# Patient Record
Sex: Male | Born: 1975 | Race: White | Hispanic: No | Marital: Single | Smoking: Former smoker
Health system: Southern US, Community
[De-identification: ages and names within clinical notes are randomized; demographics above are authoritative.]

## PROBLEM LIST (undated history)

## (undated) DIAGNOSIS — Z87442 Personal history of urinary calculi: Secondary | ICD-10-CM

## (undated) DIAGNOSIS — Z8719 Personal history of other diseases of the digestive system: Secondary | ICD-10-CM

## (undated) DIAGNOSIS — L709 Acne, unspecified: Secondary | ICD-10-CM

## (undated) DIAGNOSIS — E782 Mixed hyperlipidemia: Secondary | ICD-10-CM

## (undated) DIAGNOSIS — E119 Type 2 diabetes mellitus without complications: Secondary | ICD-10-CM

## (undated) DIAGNOSIS — F419 Anxiety disorder, unspecified: Secondary | ICD-10-CM

## (undated) DIAGNOSIS — G4733 Obstructive sleep apnea (adult) (pediatric): Secondary | ICD-10-CM

## (undated) DIAGNOSIS — K589 Irritable bowel syndrome without diarrhea: Secondary | ICD-10-CM

## (undated) DIAGNOSIS — F411 Generalized anxiety disorder: Secondary | ICD-10-CM

## (undated) DIAGNOSIS — G4730 Sleep apnea, unspecified: Secondary | ICD-10-CM

## (undated) DIAGNOSIS — K629 Disease of anus and rectum, unspecified: Secondary | ICD-10-CM

## (undated) DIAGNOSIS — Z9989 Dependence on other enabling machines and devices: Secondary | ICD-10-CM

## (undated) DIAGNOSIS — R7989 Other specified abnormal findings of blood chemistry: Secondary | ICD-10-CM

## (undated) DIAGNOSIS — N62 Hypertrophy of breast: Secondary | ICD-10-CM

## (undated) DIAGNOSIS — I10 Essential (primary) hypertension: Secondary | ICD-10-CM

## (undated) DIAGNOSIS — N529 Male erectile dysfunction, unspecified: Secondary | ICD-10-CM

## (undated) DIAGNOSIS — K602 Anal fissure, unspecified: Secondary | ICD-10-CM

## (undated) DIAGNOSIS — F1020 Alcohol dependence, uncomplicated: Secondary | ICD-10-CM

## (undated) DIAGNOSIS — K219 Gastro-esophageal reflux disease without esophagitis: Secondary | ICD-10-CM

## (undated) DIAGNOSIS — Z973 Presence of spectacles and contact lenses: Secondary | ICD-10-CM

## (undated) DIAGNOSIS — F32A Depression, unspecified: Secondary | ICD-10-CM

## (undated) DIAGNOSIS — E291 Testicular hypofunction: Secondary | ICD-10-CM

## (undated) DIAGNOSIS — R7303 Prediabetes: Secondary | ICD-10-CM

## (undated) DIAGNOSIS — F329 Major depressive disorder, single episode, unspecified: Secondary | ICD-10-CM

## (undated) HISTORY — DX: Anxiety disorder, unspecified: F41.9

## (undated) HISTORY — DX: Alcohol dependence, uncomplicated: F10.20

## (undated) HISTORY — DX: Gastro-esophageal reflux disease without esophagitis: K21.9

## (undated) HISTORY — DX: Irritable bowel syndrome, unspecified: K58.9

## (undated) HISTORY — PX: COLONOSCOPY: SHX174

---
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
## 1997-08-06 HISTORY — PX: URETEROLITHOTOMY: SHX71

## 2002-08-06 DIAGNOSIS — Z8719 Personal history of other diseases of the digestive system: Secondary | ICD-10-CM

## 2002-08-06 HISTORY — DX: Personal history of other diseases of the digestive system: Z87.19

## 2009-07-28 ENCOUNTER — Ambulatory Visit (HOSPITAL_BASED_OUTPATIENT_CLINIC_OR_DEPARTMENT_OTHER): Admission: RE | Admit: 2009-07-28 | Discharge: 2009-07-28 | Payer: Self-pay | Admitting: Family Medicine

## 2009-08-06 ENCOUNTER — Ambulatory Visit: Payer: Self-pay | Admitting: Internal Medicine

## 2009-10-03 ENCOUNTER — Observation Stay (HOSPITAL_COMMUNITY): Admission: EM | Admit: 2009-10-03 | Discharge: 2009-10-05 | Payer: Self-pay | Admitting: Internal Medicine

## 2009-10-03 ENCOUNTER — Encounter: Payer: Self-pay | Admitting: Emergency Medicine

## 2009-10-03 ENCOUNTER — Ambulatory Visit: Payer: Self-pay | Admitting: Internal Medicine

## 2009-10-03 ENCOUNTER — Ambulatory Visit: Payer: Self-pay | Admitting: Radiology

## 2009-10-05 ENCOUNTER — Encounter: Payer: Self-pay | Admitting: Internal Medicine

## 2009-10-05 HISTORY — PX: CARDIOVASCULAR STRESS TEST: SHX262

## 2010-04-11 ENCOUNTER — Emergency Department (HOSPITAL_BASED_OUTPATIENT_CLINIC_OR_DEPARTMENT_OTHER): Admission: EM | Admit: 2010-04-11 | Discharge: 2010-04-11 | Payer: Self-pay | Admitting: Emergency Medicine

## 2010-04-11 ENCOUNTER — Ambulatory Visit: Payer: Self-pay | Admitting: Diagnostic Radiology

## 2010-04-14 DIAGNOSIS — E781 Pure hyperglyceridemia: Secondary | ICD-10-CM | POA: Insufficient documentation

## 2010-04-14 DIAGNOSIS — R079 Chest pain, unspecified: Secondary | ICD-10-CM | POA: Insufficient documentation

## 2010-04-14 DIAGNOSIS — I10 Essential (primary) hypertension: Secondary | ICD-10-CM | POA: Insufficient documentation

## 2010-04-14 DIAGNOSIS — R002 Palpitations: Secondary | ICD-10-CM

## 2010-04-18 ENCOUNTER — Ambulatory Visit: Payer: Self-pay | Admitting: Cardiovascular Disease

## 2010-09-05 NOTE — Assessment & Plan Note (Signed)
Summary: np6/chest pains   Visit Type:  new pt visit Primary Emya Picado:  Sharlyne Cai [facility])  CC:  chest pain...sob....denies any edema...pt states he is not sure if this is a panic attack.  History of Present Illness: 35 yo WM with history of HTN, hyperlipidemia, OSA  who is here today to establish cardiology care. He describes atypical chest pain back in March 2011. He was admitted to [facility] in March 2011 and had a normal echo and stress myoview. He did well until the last week. 10 days ago, he developed a constant aching/pressure in his chest. He was seen in the ED one week ago at [facility]. His cardiac enzymes were negative. He had no EKG changes. He was not admitted. He is here today for ED follow up. He has been feeling better. Occasional pressure in the chest at rest at times of anxiety.   Current Medications (verified): 1)  Clonazepam 0.5 Mg Tabs (Clonazepam) .... Two Times A Day As Needed 2)  Amlodipine Besylate 10 Mg Tabs (Amlodipine Besylate) .... Take One Tablet By Mouth Daily 3)  Aspirin 81 Mg Tbec (Aspirin) .... Take One Tablet By Mouth Daily 4)  Tenoretic 50 50-25 Mg Tabs (Atenolol-Chlorthalidone) .Marland Kitchen.. 1 Tab Once Daily 5)  Bupropion Hcl 300 Mg Xr24h-Tab (Bupropion Hcl) .Marland Kitchen.. 1 Tab Once Daily 6)  Cranberry Fruit 475 Mg Caps (Cranberry) .... 2 Once Daily 7)  Colace 100 Mg Caps (Docusate Sodium) .... 2 Once Daily 8)  Lisinopril 20 Mg Tabs (Lisinopril) .... Take One Tablet By Mouth Daily 9)  Minocycline Hcl 100 Mg Caps (Minocycline Hcl) .... Once Daily 10)  Multivitamins   Tabs (Multiple Vitamin) .... Once Daily 11)  Oscal 500/200 D-3 500-200 Mg-Unit Tabs (Calcium-Vitamin D) .... Once Daily 12)  Potassium Chloride Cr 10 Meq Cr-Caps (Potassium Chloride) .... Take One Tablet By Mouth Daily 13)  Tylenol Pm Extra Strength 500-25 Mg Tabs (Diphenhydramine-Apap (Sleep)) .... 2 Tab At Bedtime As Needed 14)  Viagra 25 Mg Tabs (Sildenafil Citrate) .... 1/2 As  Needed 15)  Fish Oil 1200 Mg Caps (Omega-3 Fatty Acids) .Marland Kitchen.. 1 Cap Once Daily 16)  Lipitor 20 Mg Tabs (Atorvastatin Calcium) .Marland Kitchen.. 1 Tab At Bedtime  Allergies (verified): No Known Drug Allergies  Past History:  Past Medical History: OSA Palpitations Anxiety/Depression Hyperlipidemia Hypertension Bowel obstruction 2003 Erectile dysfunction  Past Surgical History: History of testicular torsion status post orchiectomy  History of nephrolithiasis s/p removal  Family History: Family History of Coronary Artery Disease:  Family History of Diabetes:   Mother-alive, palpitations Father-alive, estranged but does not think he has heart disease 1 brother-alive and well 1 sister -learning disabled, no cardiac disease  Social History: Full Time Single, no kids 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  Alcohol Use - yes -- socially Drug Use - no  Review of Systems       The patient complains of chest pain.  The patient denies fatigue, malaise, fever, weight gain/loss, vision loss, decreased hearing, hoarseness, palpitations, shortness of breath, prolonged cough, wheezing, sleep apnea, coughing up blood, abdominal pain, blood in stool, nausea, vomiting, diarrhea, heartburn, incontinence, blood in urine, muscle weakness, joint pain, leg swelling, rash, skin lesions, headache, fainting, dizziness, depression, anxiety, enlarged lymph nodes, easy bruising or bleeding, and environmental allergies.    Vital Signs:  Patient profile:   35 year old male Height:      72 inches Weight:      216.8 pounds  BMI:     29.51 Pulse rate:   73 / minute Pulse rhythm:   regular BP sitting:   110 / 70  (left arm) Cuff size:   large  Vitals Entered By: Danielle Rankin, CMA (April 18, 2010 4:22 PM)  Physical Exam  General:  General: Well developed, well nourished, NAD HEENT: OP clear, mucus membranes moist SKIN: warm, dry Neuro: No focal deficits Musculoskeletal:  Muscle strength 5/5 all ext Psychiatric: Mood and affect normal Neck: No JVD, no carotid bruits, no thyromegaly, no lymphadenopathy. Lungs:Clear bilaterally, no wheezes, rhonci, crackles CV: RRR no murmurs, gallops rubs Abdomen: soft, NT, ND, BS present Extremities: No edema, pulses 2+.    EKG  Procedure date:  04/18/2010  Findings:      NSR, rate 73 bpm. Normal EKG  Impression & Recommendations:  Problem # 1:  CHEST PAIN-UNSPECIFIED (ICD-786.50) Atypical. This does not seem to be cardiac. He has a recent normal stress myoview and normal LV function on echo with no structural heart disease. No further cardiac workup at this time. I have encouraged him to begin a diet and exercise plan. He should also continue to aggressively treat his cardiac risk factors including HTN and hyperlipidemia.  Complete tobacco cessation recommended.   His updated medication list for this problem includes:    Amlodipine Besylate 10 Mg Tabs (Amlodipine besylate) .Marland Kitchen... Take one tablet by mouth daily    Aspirin 81 Mg Tbec (Aspirin) .Marland Kitchen... Take one tablet by mouth daily    Tenoretic 50 50-25 Mg Tabs (Atenolol-chlorthalidone) .Marland Kitchen... 1 tab once daily    Lisinopril 20 Mg Tabs (Lisinopril) .Marland Kitchen... Take one tablet by mouth daily  Patient Instructions: 1)  Your physician recommends that you schedule a follow-up appointment as needed. 2)  Your physician recommends that you continue on your current medications as directed. Please refer to the Current Medication list given to you today.

## 2010-10-19 LAB — CBC
HCT: 43.1 % (ref 39.0–52.0)
MCHC: 33.9 g/dL (ref 30.0–36.0)
MCV: 88.2 fL (ref 78.0–100.0)
Platelets: 285 10*3/uL (ref 150–400)
RDW: 12.2 % (ref 11.5–15.5)
WBC: 8.3 10*3/uL (ref 4.0–10.5)

## 2010-10-19 LAB — POCT CARDIAC MARKERS: Troponin i, poc: <0.05 ng/mL (ref 0.00–0.09)

## 2010-10-19 LAB — DIFFERENTIAL
Basophils Absolute: 0 10*3/uL (ref 0.0–0.1)
Basophils Relative: 1 % (ref 0–1)
Eosinophils Relative: 3 % (ref 0–5)
Lymphocytes Relative: 21 % (ref 12–46)
Monocytes Absolute: 0.6 10*3/uL (ref 0.1–1.0)

## 2010-10-19 LAB — BASIC METABOLIC PANEL
CO2: 28 mEq/L (ref 19–32)
Chloride: 103 mEq/L (ref 96–112)
Creatinine, Ser: 1.2 mg/dL (ref 0.4–1.5)
GFR calc Af Amer: >60 mL/min (ref >60)
Glucose, Bld: 105 mg/dL — ABNORMAL HIGH (ref 70–99)

## 2010-10-26 LAB — CBC
HCT: 43.9 % (ref 39.0–52.0)
MCHC: 34.9 g/dL (ref 30.0–36.0)
MCV: 87.4 fL (ref 78.0–100.0)
Platelets: 245 10*3/uL (ref 150–400)
RDW: 12 % (ref 11.5–15.5)

## 2010-10-26 LAB — CARDIAC PANEL(CRET KIN+CKTOT+MB+TROPI)
CK, MB: 1.5 ng/mL (ref 0.3–4.0)
Troponin I: 0.02 ng/mL (ref 0.00–0.06)

## 2010-10-26 LAB — DIFFERENTIAL
Basophils Absolute: 0.1 10*3/uL (ref 0.0–0.1)
Basophils Relative: 1 % (ref 0–1)
Eosinophils Absolute: 0.2 10*3/uL (ref 0.0–0.7)
Eosinophils Relative: 3 % (ref 0–5)
Lymphs Abs: 1.9 10*3/uL (ref 0.7–4.0)
Neutrophils Relative %: 65 % (ref 43–77)

## 2010-10-26 LAB — POCT CARDIAC MARKERS
CKMB, poc: <1 ng/mL — ABNORMAL LOW (ref 1.0–8.0)
Myoglobin, poc: 60 ng/mL (ref 12–200)
Troponin i, poc: <0.05 ng/mL (ref 0.00–0.09)
Troponin i, poc: <0.05 ng/mL (ref 0.00–0.09)

## 2010-10-26 LAB — D-DIMER, QUANTITATIVE: D-Dimer, Quant: <0.22 ug/mL-FEU (ref 0.00–0.48)

## 2010-10-26 LAB — BASIC METABOLIC PANEL
BUN: 14 mg/dL (ref 6–23)
CO2: 30 mEq/L (ref 19–32)
Calcium: 9.5 mg/dL (ref 8.4–10.5)
Chloride: 100 mEq/L (ref 96–112)
Creatinine, Ser: 1 mg/dL (ref 0.4–1.5)
GFR calc Af Amer: >60 mL/min (ref >60)
GFR calc non Af Amer: >60 mL/min (ref >60)
Glucose, Bld: 107 mg/dL — ABNORMAL HIGH (ref 70–99)
Potassium: 3.6 mEq/L (ref 3.5–5.1)
Sodium: 141 mEq/L (ref 135–145)

## 2010-10-30 LAB — COMPREHENSIVE METABOLIC PANEL
ALT: 26 U/L (ref 0–53)
Alkaline Phosphatase: 76 U/L (ref 39–117)
BUN: 15 mg/dL (ref 6–23)
CO2: 30 mEq/L (ref 19–32)
GFR calc non Af Amer: >60 mL/min (ref >60)
Glucose, Bld: 101 mg/dL — ABNORMAL HIGH (ref 70–99)
Potassium: 4.3 mEq/L (ref 3.5–5.1)
Sodium: 137 mEq/L (ref 135–145)

## 2010-10-30 LAB — CBC
HCT: 41.3 % (ref 39.0–52.0)
Hemoglobin: 14.4 g/dL (ref 13.0–17.0)
MCHC: 34.8 g/dL (ref 30.0–36.0)
RBC: 4.66 MIL/uL (ref 4.22–5.81)

## 2010-10-30 LAB — CARDIAC PANEL(CRET KIN+CKTOT+MB+TROPI): CK, MB: 1.4 ng/mL (ref 0.3–4.0)

## 2010-10-30 LAB — TSH: TSH: 1.153 u[IU]/mL (ref 0.350–4.500)

## 2011-01-27 IMAGING — CR DG CHEST 1V PORT
1 series · 1 of 1 positions shown · non-contrast
Comparison: None.

CLINICAL DATA: Tachycardia.  Chest pressure.

PORTABLE CHEST - 1 VIEW

[view not recorded]
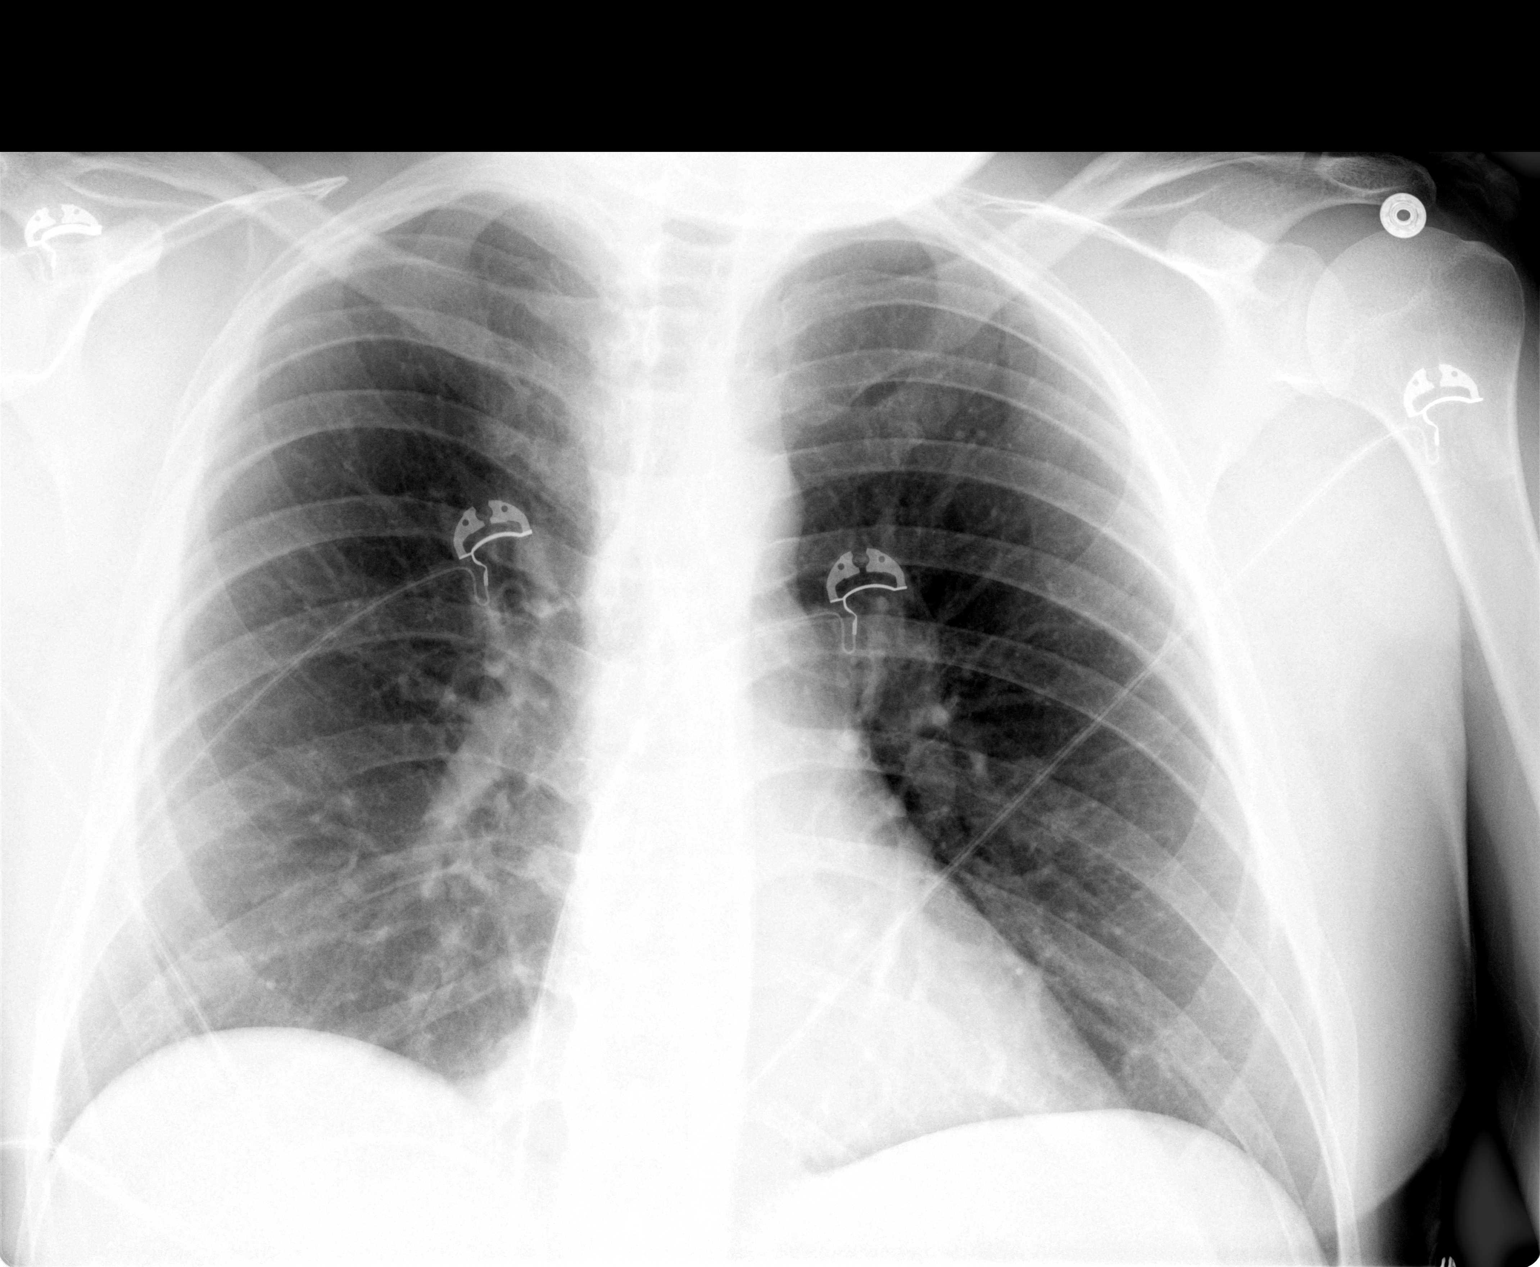

[1 of 1 positions shown; findings below may reference images not displayed]

FINDINGS: Normal cardiomediastinal silhouette size and contours for
this single view.  No pulmonary vascular congestion or active lung
process.  Intact bony thorax.
IMPRESSION: No active chest disease.

## 2011-06-29 ENCOUNTER — Emergency Department (HOSPITAL_BASED_OUTPATIENT_CLINIC_OR_DEPARTMENT_OTHER)
Admission: EM | Admit: 2011-06-29 | Discharge: 2011-06-29 | Disposition: A | Discharge status: Home / Self Care | Payer: Managed Care, Other (non HMO) | Attending: Emergency Medicine | Admitting: Emergency Medicine

## 2011-06-29 ENCOUNTER — Encounter: Payer: Self-pay | Admitting: Emergency Medicine

## 2011-06-29 DIAGNOSIS — G4730 Sleep apnea, unspecified: Secondary | ICD-10-CM | POA: Insufficient documentation

## 2011-06-29 DIAGNOSIS — I10 Essential (primary) hypertension: Secondary | ICD-10-CM | POA: Insufficient documentation

## 2011-06-29 DIAGNOSIS — E785 Hyperlipidemia, unspecified: Secondary | ICD-10-CM | POA: Insufficient documentation

## 2011-06-29 DIAGNOSIS — L509 Urticaria, unspecified: Secondary | ICD-10-CM | POA: Insufficient documentation

## 2011-06-29 HISTORY — DX: Essential (primary) hypertension: I10

## 2011-06-29 HISTORY — DX: Sleep apnea, unspecified: G47.30

## 2011-06-29 MED ORDER — PREDNISONE 50 MG PO TABS: ORAL_TABLET | ORAL | Status: AC

## 2011-06-29 MED ORDER — FAMOTIDINE 20 MG PO TABS
40.0000 mg | ORAL_TABLET | Freq: Once | ORAL | Status: AC
  Administered 2011-06-29: 40 mg via ORAL
  Filled 2011-06-29 (×2): qty 1

## 2011-06-29 MED ORDER — DIPHENHYDRAMINE HCL 25 MG PO CAPS
25.0000 mg | ORAL_CAPSULE | Freq: Once | ORAL | Status: AC
  Administered 2011-06-29: 25 mg via ORAL
  Filled 2011-06-29 (×2): qty 1

## 2011-06-29 MED ORDER — DEXAMETHASONE SODIUM PHOSPHATE 10 MG/ML IJ SOLN
10.0000 mg | Freq: Once | INTRAMUSCULAR | Status: AC
Start: 2011-06-29 — End: 2011-06-29
  Administered 2011-06-29: 10 mg via INTRAMUSCULAR
  Filled 2011-06-29: qty 1

## 2011-06-29 NOTE — ED Provider Notes (Signed)
History     CSN: [id number] Arrival date & time: 06/29/2011  3:49 AM   First MD Initiated Contact with Patient 06/29/11 0340      Chief Complaint  Patient presents with  . Rash    Patient is a 35 y.o. male presenting with rash. The history is provided by the patient.  Rash  This is a new problem. The current episode started 1 to 2 hours ago. The problem has been gradually worsening. The problem is associated with nothing. There has been no fever. Affected Location: torso/extremities. The patient is experiencing no pain. Associated symptoms include itching. The treatment provided mild relief.  pt reports onset of hives earlier tonight Only new exposure is bactrim, though he has been on this previously without issue No sob No dizziness/diarrhea No difficulty swallowing, no facial swelling  Past Medical History  Diagnosis Date  . Hypertension   . Hyperlipemia   . Sleep apnea     Past Surgical History  Procedure Date  . Testicle removal   . Cystoscopy     No family history on file.  History  Substance Use Topics  . Smoking status: Never Smoker   . Smokeless tobacco: Not on file  . Alcohol Use: Yes      Review of Systems  Constitutional: Negative for fever.  Skin: Positive for itching and rash.  All other systems reviewed and are negative.    Allergies  Review of patient's allergies indicates no known allergies.  Home Medications   Current Outpatient Rx  Name Route Sig Dispense Refill  . AMLODIPINE BESYLATE 10 MG PO TABS Oral Take 10 mg by mouth daily.      . ATENOLOL-CHLORTHALIDONE 50-25 MG PO TABS Oral Take 1 tablet by mouth daily.      . ATORVASTATIN CALCIUM 20 MG PO TABS Oral Take 20 mg by mouth daily.      . BUPROPION HCL ER (XL) 150 MG PO TB24 Oral Take 150 mg by mouth daily.      Marland Kitchen DIPHENHYDRAMINE HCL (SLEEP) 25 MG PO TABS Oral Take 50 mg by mouth once.      Marland Kitchen LISINOPRIL 20 MG PO TABS Oral Take 20 mg by mouth daily.      Marland Kitchen POTASSIUM CHLORIDE 10 MEQ  PO TBCR Oral Take 10 mEq by mouth daily.      . SULFAMETHOXAZOLE-TRIMETHOPRIM 800-160 MG PO TABS Oral Take 1 tablet by mouth 2 (two) times daily.        BP 143/83  Pulse 93  Temp(Src) 98.1 F (36.7 C) (Oral)  Resp 18  SpO2 100%  Physical Exam  CONSTITUTIONAL: Well developed/well nourished HEAD AND FACE: Normocephalic/atraumatic EYES: EOMI/PERRL, no conjunctival lesions ENMT: Mucous membranes moist, no angioedema, no oral lesions, pharynx normal Voice normal.   Neck - supple CV: S1/S2 noted, no murmurs/rubs/gallops noted LUNGS: Lungs are clear to auscultation bilaterally, no apparent distress ABDOMEN: soft, nontender, no rebound or guarding GU:no cva tenderness NEURO: Pt is awake/alert, moves all extremitiesx4 EXTREMITIES: pulses normal, full ROM SKIN: warm, urticaria noted to extremities/torso, spares head/neck/palms PSYCH: no abnormalities of mood noted   ED Course  Procedures    4:03 AM Pt well appearing, will try more benadryl/steroids and reassess  4:57 AM Pt is going to stop his bactrim for now, start prednisone, f/u with his dermatologist next week Discussed strict return precautions  MDM  Nursing notes reviewed and considered in documentation         Joya Gaskins, MD 06/29/11 [phone]

## 2011-06-29 NOTE — ED Notes (Addendum)
Pt states he has been taking septra for folliculitis x 1 week. Pt took benadryl 50 mg po 30 min PTA

## 2011-06-29 NOTE — ED Notes (Signed)
Pt reports hive like rash started approx 2300 last night.

## 2013-05-19 DIAGNOSIS — G4733 Obstructive sleep apnea (adult) (pediatric): Secondary | ICD-10-CM | POA: Insufficient documentation

## 2013-06-25 ENCOUNTER — Encounter: Payer: Self-pay | Admitting: Internal Medicine

## 2013-06-25 ENCOUNTER — Ambulatory Visit (INDEPENDENT_AMBULATORY_CARE_PROVIDER_SITE_OTHER): Payer: 59 | Admitting: Internal Medicine

## 2013-06-25 VITALS — BP 124/84 | HR 84 | Temp 98.6°F | Resp 12 | Ht <= 58 in | Wt 237.5 lb

## 2013-06-25 DIAGNOSIS — E280 Estrogen excess: Secondary | ICD-10-CM

## 2013-06-25 DIAGNOSIS — E291 Testicular hypofunction: Secondary | ICD-10-CM

## 2013-06-25 NOTE — Progress Notes (Signed)
Patient ID: Tyler Gross, male   DOB: [birth year], 37 y.o.   MRN: [id number]  HPI: Tyler Gross is a 37 y.o.-year-old man, referred by his PCP, Dr. Ashley Royalty, for evaluation and management of low testosterone and gynecomastia.  He started to have Left breast pain 1.5-2 mo ago, but he has had enlargement for longer time. No nipple discharge or retraction. Breast ultrasound and mammogram: fibrocystic densities (read as gynecomastia) - report not available to review.   He was dx with hypogonadism in 2008: - 2008: Total testosterone 321, had pbs with erections then >> referred to urology : low T, LH also low >> saw endocrinology : PRL high >> had MRI pituitary >> no pituitary tumor. - 2011: seen at [facility] endocrinology >> PRL still high >> new pituitary MRI >> no tumor. Testosterone still slightly low >> advised to just watch it - 2014: testosterone 200s, referred again to endocrinology >> he is here now.   Reviewed labs available per Care Everywhere:   He had the L testicle resected in 1991 after trauma during swimming >> subsequent torsion. He has had R testicle U/S in 2001 >> no masses or torsion.  He admits for decreased libido - recently, b/c of stress Has difficulty maintaining an erection - Viagra works (but gets a HA and stuffy nose) + trauma to testes (see above), no testicular irradiation or surgery No h/o of mumps orchitis/h/o autoimmune ds. No h/o cryptorchidism He grew and went through puberty like his peers No shrinking of testes. No very small testes (<5 ml) No incomplete/delayed sexual development     + breast discomfort/gynecomastia    No loss of body hair (axillary/pubic)/decreased need for shaving No height loss but had osteopenia in R hip. No abnormal sense of smell. No hot flushes No vision problems No worst HA of his life No FH of hypogonadism/infertility No personal h/o infertility, but he does not have children by choice No FH of hemochromatosis   No  excessive weight gain or loss. 225 >> 237 in 1 year No chronic diseases No chronic pain. Not on opiates, does not take steroids.  Can have more than 2 drinks a day of alcohol at a time - some weeks 2-4 drinks a day No anabolic steroids use No herbal medicines On antidepressants: Bupropion and Celexa.  Other labs/investigations: - He had severe acne outbreaks >> 2x 2h urine for cortisol in 2008 and 2012 was normal - reportedly. - OGTT 2 years ago >> normal. Last HbA1c was from 2012: 5.5%. - prolactin: 08/27/2011: 11.6 02/08/2012: 11 06/02/2013: 14.7 - TSH: 09/27/2009: 1.850 10/02/2010: 1.300 07/02/2011: 2.799 06/27/2012: 0.670 - CMP: 03/11/2013: Normal except glucose 104 - lipids: 008/01/2013: 229/343/46/114 - vitamin D: 07/02/2011: 47 - Vitamin B12: 09/27/2009: 616   ROS: per HPI + Constitutional: + weight gain, + fatigue, no subjective hyperthermia/hypothermia Eyes: no blurry vision, no xerophthalmia ENT: no sore throat, no nodules palpated in throat, no dysphagia/odynophagia, no hoarseness Cardiovascular: no CP/SOB/+palpitations/no leg swelling Respiratory: no cough/SOB Gastrointestinal: no N/V/+D/+C Musculoskeletal: no muscle/joint aches Skin: no rashes now (had acne in the past - on back), + itching Neurological: no tremors/numbness/tingling/dizziness Psychiatric: + depression/+ anxiety  Past Medical History  Diagnosis Date  . Hypertension   . Hyperlipemia   . Sleep apnea    Past Surgical History  Procedure Laterality Date  . Testicle removal left 1990  . Cystoscopy - kidney stone removed  1999   History   Social History  . 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 Smoking status: Never Smoker   . Smokeless tobacco: Not on file  . Alcohol Use: 1.5 oz/week    3 drink(s) per week  . Drug Use: No  . Sexual Activity: Yes   Social History  Narrative   Lives with partner   Regular exercise: no   Caffeine use: 1 large cup of coffee daily; 2 to 3 sodas in the evening   Current Outpatient Prescriptions on File Prior to Visit  Medication Sig Dispense Refill  . amLODipine (NORVASC) 10 MG tablet Take 10 mg by mouth daily.        Marland Kitchen atenolol-chlorthalidone (TENORETIC) 50-25 MG per tablet Take 1 tablet by mouth daily.        Marland Kitchen atorvastatin (LIPITOR) 20 MG tablet Take 20 mg by mouth daily.        Marland Kitchen buPROPion (WELLBUTRIN XL) 150 MG 24 hr tablet Take 150 mg by mouth daily.        Marland Kitchen lisinopril (PRINIVIL,ZESTRIL) 20 MG tablet Take 20 mg by mouth daily.        . potassium chloride (KLOR-CON) 10 MEQ CR tablet Take 10 mEq by mouth daily.        Marland Kitchen sulfamethoxazole-trimethoprim (BACTRIM DS,SEPTRA DS) 800-160 MG per tablet Take 1 tablet by mouth 2 (two) times daily.         No current facility-administered medications on file prior to visit.   Allergies  Allergen Reactions  . Sulfamethoxazole-Tmp Ds Hives    Bactrim    Family History  Problem Relation Age of Onset  . Cancer Mother     breast cancer: stage I  . Hypertension Mother    PE: BP 124/84  Pulse 84  Temp(Src) 98.6 F (37 C) (Oral)  Resp 12  Ht 6" (0.152 m)  Wt 237 lb 8 oz (107.729 kg)  BMI 4662.79 kg/m2  SpO2 97% Wt Readings from Last 3 Encounters:  06/25/13 237 lb 8 oz (107.729 kg)  04/18/10 216 lb 12.8 oz (98.34 kg)   Constitutional: overweight, in NAD Eyes: PERRLA, EOMI, no exophthalmos ENT: moist mucous membranes, no thyromegaly, no cervical lymphadenopathy Cardiovascular: RRR, No MRG Respiratory: CTA B Gastrointestinal: abdomen soft, NT, ND, BS+ Musculoskeletal: no deformities, strength intact in all 4 Skin: moist, warm, no rashes Neurological: no tremor with outstretched hands, DTR 3/4 in all  4 Genital exam: normal male escutcheon, no inguinal LAD, normal phallus, R testis ~25 mL, L testicular prosthesis, no testicular masses, no penile discharge.  +  bilateral gynecomastia 3 cm. Dense nodules palpated near the gland tissue in left breast close to nipple and in R breast at 10 o'clock outside gland. No axillary LAD.  ASSESSMENT: 1. Low testosterone - normal free T  2. ED  PLAN:  1. Low total testosterone I had a long discussion with the pt regarding his testosterone results, which we reviewed together. I explained that the total testosterone consists of bioavailable T (free T + weakly bound) and bound testosterone. The total testosterone can be low: - later in the afternoon  - when the testes do not secrete enough testosterone (in this case the free testosterone is low, too)   - when the SHBG is low (in this case the free testosterone is normal) In his case, the lower SHBG is most likely the reason for the pt's repeatedly low total T. The  treatment for this is not exogenous testosterone, but improving his sleep, diet and exercise. Therefore, in his case, with a free testosterone was in the normal range, testosterone treatment is not indicated, and might even be harmful as per the latest studies that showed increased cardiac risk with testosterone supplementation. - I advised him that testosterone is only recommended for pts with clearly low testosterone. There is also no evidence that increasing the testosterone within the normal range will help with either erections or his energy. Moreover, the normal range is not spanning different ages, but it is determined in young men, at 8-9 am, fasting. Therefore, a free T level of 40 is not "the testosterone level of an 80-y/o man", but can be perfectly normal in a young man, too. - we also discussed about controlling his diet (advised him to cut down fat and meat and increase the use of fruit and vegetables), improve his cholesterol, improve his sleep - he has OSA, will have the Oxygen flow adjusted soon, exercise regularly. He agrees to start this.  - one other problem could be his increased Cymbalta  recently. All SSRIs are possible culprits for decreased testosterone.  - also, advised to limit alcohol to 2 drinks daily or less - we also discussed it is reassuring that he had 2 normal pituitary MRIs - We decided to recheck testosterone fasting, at 8 am. - pt was relieved that he does not need to start testosterone and agrees to have a repeat testosterone level checked  2. Gynecomastia L>R - fibrocystic changes bilaterally per mammogram and U/S - I do not have the full report but gynecomastia is palpable ~3 cm bilaterally - we discussed possible etiologies, frequently 2/2 increased ratio of Estrogen/Testosterone  - we will check several labs to make sure he does not have hyperthyroidism or a testicular tumor.  - we can try Clomiphene 50 mg daily for 3 mo to help with this (it will also help the testosterone level, although I underlined the fact that this would not be the first reason for using it). We also discussed Tamoxifen use, but I would prefer Clomiphene for him 2/2 low testosterone and osteopenia since Clomiphene is a selective hypothalamic and pituitary estrogen R blocker. - he agrees to plan  3. Erectile dysfunction - this is most likely not caused by the decreased testosterone, since his free testosterone is in the normal range - continue PDE5 inhibitor   Component     Latest Ref Rng 07/01/2013  WBC     4.5 - 10.5 K/uL 8.8  RBC     4.22 - 5.81 Mil/uL 5.01  Platelets     150.0 - 400.0 K/uL 259.0  Hemoglobin     13.0 - 17.0 g/dL 21.3  HCT     08.6 - 57.8 % 44.8  MCV     78.0 - 100.0 fl 89.3  MCHC     30.0 - 36.0 g/dL 46.9  RDW     62.9 - 52.8 % 13.2  Testosterone     300 - 890 ng/dL 413  Sex Hormone Binding     13 - 71 nmol/L 28  Testosterone Free     47.0 - 244.0 pg/mL 73.6  Testosterone-% Free     1.6 - 2.9 % 2.2  Estradiol, Free      0.51 (H)  Estradiol      24  Results received      07/09/13  Iron     42 - 165 ug/dL 88  Transferrin     212.0 - 360.0  mg/dL 161.0  Saturation Ratios     20.0 - 50.0 % 20.2  LH     1.50 - 9.30 mIU/mL 3.38  FSH     1.4 - 18.1 mIU/ML 5.3  Somatomedin (IGF-I)     73 - 330 ng/mL 113  hCG, Beta Chain, Quant, S      0.17  TSH     0.35 - 5.50 uIU/mL 1.39  Free T4     0.60 - 1.60 ng/dL 9.60  T3, Free     2.3 - 4.2 pg/mL 2.9  Hemoglobin A1C     4.6 - 6.5 % 5.8  PSA     0.10 - 4.00 ng/mL 1.33   All normal, except slightly high Estradiol. D/w pt over the phone >> will get testicular U/S. No indication for testosterone replacement. Patient pleased to hear that. He tells me that his breast pain is almost gone, in this case, he would like to hold off clomiphene or tamoxifen, and I agree. I advised him to let me know if the pain comes back.  CLINICAL DATA: History of left orchiectomy following traumatic injury in 1991. Low testosterone and estradiol access.  EXAM: ULTRASOUND OF SCROTUM  TECHNIQUE: Complete ultrasound examination of the testicles, epididymis, and other scrotal structures was performed.  COMPARISON: None.  FINDINGS: Right testicle  Measurements: 5.8 x 2.9 x 4.0 cm. No mass or microlithiasis visualized.  Left testicle  Surgically absent. There is a hypoechoic prosthesis in the left hemiscrotum.  Right epididymis: Normal in size and appearance.  Left epididymis: Surgically absent.  Hydrocele: None visualized.  Varicocele: None visualized.  IMPRESSION: Negative. No evidence for testicular mass or other significant abnormality in the solitary right testicle. The left testicular prosthesis is unremarkable in appearance.   Electronically Signed By: Malachy Moan M.D. On: 07/22/2013 08:34

## 2013-06-25 NOTE — Patient Instructions (Signed)
Please come back for a follow-up appointment in 3 months. Please come to the lab fasting, at 8 am.

## 2013-07-01 ENCOUNTER — Other Ambulatory Visit (INDEPENDENT_AMBULATORY_CARE_PROVIDER_SITE_OTHER): Payer: 59

## 2013-07-01 DIAGNOSIS — E291 Testicular hypofunction: Secondary | ICD-10-CM

## 2013-07-01 LAB — IBC PANEL: Iron: 88 ug/dL (ref 42–165)

## 2013-07-01 LAB — CBC
HCT: 44.8 % (ref 39.0–52.0)
Hemoglobin: 15.3 g/dL (ref 13.0–17.0)
MCV: 89.3 fl (ref 78.0–100.0)
Platelets: 259 10*3/uL (ref 150.0–400.0)
RBC: 5.01 Mil/uL (ref 4.22–5.81)
WBC: 8.8 10*3/uL (ref 4.5–10.5)

## 2013-07-01 LAB — TSH: TSH: 1.39 u[IU]/mL (ref 0.35–5.50)

## 2013-07-01 LAB — HCG, QUANTITATIVE, PREGNANCY: hCG, Beta Chain, Quant, S: 0.17 m[IU]/mL

## 2013-07-01 LAB — LUTEINIZING HORMONE: LH: 3.38 m[IU]/mL (ref 1.50–9.30)

## 2013-07-01 LAB — T4, FREE: Free T4: 0.7 ng/dL (ref 0.60–1.60)

## 2013-07-01 LAB — FOLLICLE STIMULATING HORMONE: FSH: 5.3 m[IU]/mL (ref 1.4–18.1)

## 2013-07-01 LAB — HEMOGLOBIN A1C: Hgb A1c MFr Bld: 5.8 % (ref 4.6–6.5)

## 2013-07-03 LAB — TESTOSTERONE, FREE, TOTAL, SHBG
Testosterone, Free: 73.6 pg/mL (ref 47.0–244.0)
Testosterone-% Free: 2.2 % (ref 1.6–2.9)
Testosterone: 337 ng/dL (ref 300–890)

## 2013-07-03 LAB — INSULIN-LIKE GROWTH FACTOR: Somatomedin (IGF-I): 113 ng/mL (ref 73–330)

## 2013-07-09 ENCOUNTER — Telehealth: Payer: Self-pay | Admitting: Internal Medicine

## 2013-07-09 LAB — ESTRADIOL, FREE
Estradiol, Free: 0.51 pg/mL — ABNORMAL HIGH
Estradiol: 24 pg/mL

## 2013-07-09 NOTE — Telephone Encounter (Signed)
Pt would like all of his lab results please Call back: [phone]  Thank You :)

## 2013-07-09 NOTE — Telephone Encounter (Signed)
Pt called requesting lab results. Please advise.  

## 2013-07-09 NOTE — Telephone Encounter (Signed)
Estradiol, IGF1 and testosterone still pending >> Carollee Herter, can you please check with the lab about this? I sent him the rest of the labs through MyChart right before [facility]iving.

## 2013-07-14 ENCOUNTER — Encounter: Payer: Self-pay | Admitting: Neurology

## 2013-07-14 ENCOUNTER — Ambulatory Visit (INDEPENDENT_AMBULATORY_CARE_PROVIDER_SITE_OTHER): Payer: 59 | Admitting: Neurology

## 2013-07-14 VITALS — BP 120/83 | HR 87 | Temp 98.5°F | Ht 72.0 in | Wt 243.0 lb

## 2013-07-14 DIAGNOSIS — G4733 Obstructive sleep apnea (adult) (pediatric): Secondary | ICD-10-CM

## 2013-07-14 DIAGNOSIS — I10 Essential (primary) hypertension: Secondary | ICD-10-CM

## 2013-07-14 DIAGNOSIS — F411 Generalized anxiety disorder: Secondary | ICD-10-CM

## 2013-07-14 NOTE — Progress Notes (Signed)
Subjective:    Patient ID: Tyler Gross is a 37 y.o. male.  HPI   Huston Foley, MD, PhD [facility] [address], [address] [address] [place], [place] [address]  Dear Dr. Ashley Royalty,   I saw your patient, Tyler Gross, upon your kind request in my neurologic clinic today for initial consultation of his sleep disorder, in particular his obstructive sleep apnea. The patient is unaccompanied today. As you know, [name] is a 37 year old right-handed gentleman with an underlying medical history of hypogonadism, who was previously diagnosed with obstructive sleep apnea in 2008. He presents for re-evaluation.  I reviewed his sleep study report from July 2008 which was a split-night sleep study and showed an elevated AHI of 87 per hour. It appeared that CPAP of 8 cm was adequate with AHI less than 5 per hour and oxygen saturation above 90 with snoring eliminated. He reports weight gain of at least 20 lb since then. He was placed on CPAP of 8 cm, then on 10 cm in 2010. Gradually, he started noticing, that he may not be getting enough air at night and was placed on Auto-PAP.  I reviewed a CPAP titration study which he had a [place] from 07/28/2009. CPAP was titrated to a pressure of 10 cm of water and his AHI was reduced to 0 per hour. He was given a full face mask size medium. His sleep efficiency was 88.4%. He had normal percentage of REM sleep with a normal REM latency. His arousal index was 16.3/h.  He has been on AutoPap for the past 14 days, from 4-20 cm and I reviewed his compliance data for the past 14 days, 06/30/2013 through 07/13/2013 during which time he was fully compliant with CPAP. His average usage was 7 hours and 25 minutes. His sleep was reasonable. His residual AHI was 1.7 per hour. His 95th percentile pressure was 11.8 cm of water. He still uses a FFM CPAP has helped his sleep, his EDS and he used to wake up in a panic or startle before he was  diagnosed. He used to snore very loudly.    His typical bedtime is reported to be around 11:30 PM and usual wake time is around 7 AM. Sleep onset typically occurs within a few minutes. He reports feeling adequately rested upon awakening. He wakes up on an average 1 to 2 times in the middle of the night and has to go to the bathroom 0 times on a typical night. He denies morning headaches.  He reports excessive daytime somnolence (EDS) and His Epworth Sleepiness Score (ESS) is 11/24 today. He has not fallen asleep while driving, since 2130. The patient has not been taking a planned nap.   He has been known to snore for the past many years. Snoring is reportedly marked, and associated with choking sounds and witnessed apneas. The patient reports a rare sense of choking or strangling feeling since the AutoPAP. There is report of nighttime reflux, with occasional nighttime cough experienced. The patient has not noted any RLS symptoms and is not known to kick while asleep or before falling asleep. There is family history of OSA in his sister and his mother.  He is a restless sleeper and in the morning, the bed is quite disheveled.   He denies cataplexy, sleep paralysis, hypnagogic or hypnopompic hallucinations, or sleep attacks. He does not report any vivid dreams, nightmares, dream enactments, or parasomnias, such as sleep talking or sleep walking. He consumes  2 to 3 caffeinated beverages per day, usually in the form of coffee in the AM and sodas in the evening. He drinks alcohol each night: He drinks 2-3 glasses of wine. Of note there is a family history of alcoholism in his mother and grandfather. He quit smoking in 2010. He takes clonazepam at night. He has been seeing a psychiatrist for his anxiety disorder. He takes Cymbalta and Coreg at night as well. He is on Wellbutrin during the day. His bedroom is usually dark and cool. There is a TV in the bedroom and usually it is not on at night.   His Past Medical  History Is Significant For: Past Medical History  Diagnosis Date  . Hypertension   . Hyperlipemia   . Sleep apnea     His Past Surgical History Is Significant For: Past Surgical History  Procedure Laterality Date  . Testicle removal    . Cystoscopy      His Family History Is Significant For: Family History  Problem Relation Age of Onset  . Cancer Mother     breast cancer: stage I  . Hypertension Mother     His Social History Is Significant For: History   Social History  . Marital Status: Single    Spouse Name: N/A    Number of Children: N/A  . Years of Education: N/A   Social History Main Topics  . Smoking status: Never Smoker   . Smokeless tobacco: None  . Alcohol Use: 1.5 oz/week    3 drink(s) per week  . Drug Use: No  . Sexual Activity: Yes    Partners: Male   Other Topics Concern  . None   Social History Narrative   Lives with partner   Regular exercise: no   Caffeine use: 1 large cup of coffee daily; 2 to 3 sodas in the evening    His Allergies Are:  Allergies  Allergen Reactions  . Bee Venom Swelling  . Sulfamethoxazole-Tmp Ds Hives    Bactrim   :   His Current Medications Are:  Outpatient Encounter Prescriptions as of 07/14/2013  Medication Sig  . atenolol-chlorthalidone (TENORETIC) 50-25 MG per tablet Take 1 tablet by mouth daily.    Marland Kitchen atorvastatin (LIPITOR) 20 MG tablet Take 20 mg by mouth daily.    Marland Kitchen buPROPion (WELLBUTRIN XL) 150 MG 24 hr tablet Take 150 mg by mouth daily.    . Calcium Carb-Cholecalciferol 600-800 MG-UNIT TABS Take 1 tablet by mouth daily.   . carvedilol (COREG) 6.25 MG tablet Take 12.5 mg by mouth daily.   . clonazePAM (KLONOPIN) 0.5 MG tablet Take 0.5 mg by mouth at bedtime as needed and may repeat dose one time if needed.  . Cranberry Extract 250 MG TABS Take 1 tablet by mouth daily.   . DULoxetine (CYMBALTA) 60 MG capsule Take 60 mg by mouth every evening.   Marland Kitchen lisinopril (PRINIVIL,ZESTRIL) 20 MG tablet Take 20 mg by  mouth daily.    . potassium chloride (K-DUR,KLOR-CON) 10 MEQ tablet Take 1 tablet by mouth daily.  . sildenafil (VIAGRA) 100 MG tablet Take 50 mg by mouth as needed.   . triamcinolone cream (KENALOG) 0.1 % Apply topically 2 (two) times daily.  . famotidine (PEPCID) 20 MG tablet Take 20 mg by mouth daily.  Marland Kitchen loratadine (CLARITIN) 10 MG tablet Take 10 mg by mouth daily.  . [DISCONTINUED] amLODipine (NORVASC) 10 MG tablet Take 10 mg by mouth daily.    . [DISCONTINUED] clonazePAM (KLONOPIN) 0.5 MG  tablet Take 0.5 mg by mouth at bedtime.   . [DISCONTINUED] potassium chloride (KLOR-CON) 10 MEQ CR tablet Take 10 mEq by mouth daily.    . [DISCONTINUED] sulfamethoxazole-trimethoprim (BACTRIM DS,SEPTRA DS) 800-160 MG per tablet Take 1 tablet by mouth 2 (two) times daily.     Review of Systems:  Out of a complete 14 point review of systems, all are reviewed and negative with the exception of these symptoms as listed below:   Review of Systems  Constitutional: Negative.   HENT: Negative.   Eyes: Negative.   Respiratory: Positive for cough.   Cardiovascular: Negative.   Gastrointestinal: Negative.   Endocrine: Negative.   Genitourinary: Negative.   Musculoskeletal: Negative.   Skin: Negative.   Allergic/Immunologic: Negative.   Neurological: Negative.   Hematological: Negative.   Psychiatric/Behavioral: Positive for dysphoric mood. The patient is nervous/anxious.     Objective:  Neurologic Exam  Physical Exam Physical Examination:   Filed Vitals:   07/14/13 0829  BP: 120/83  Pulse: 87  Temp: 98.5 F (36.9 C)    General Examination: The patient is a very pleasant 37 y.o. male in no acute distress. He appears well-developed and well-nourished and very well groomed. He is obese.   HEENT: Normocephalic, atraumatic, pupils are equal, round and reactive to light and accommodation. Funduscopic exam is normal with sharp disc margins noted. Extraocular tracking is good without limitation to  gaze excursion or nystagmus noted. Normal smooth pursuit is noted. Hearing is grossly intact. Tympanic membranes are clear bilaterally. Face is symmetric with normal facial animation and normal facial sensation. Speech is clear with no dysarthria noted. There is no hypophonia. There is no lip, neck/head, jaw or voice tremor. Neck is supple with full range of passive and active motion. There are no carotid bruits on auscultation. Oropharynx exam reveals: mild mouth dryness, adequate dental hygiene and moderate airway crowding, due to redundant soft palate, which reaches far down and larger uvula, which appears to be mildly irritated. Mallampati is class II. Tongue protrudes centrally and palate elevates symmetrically. Tonsils are 1+ in size. Neck size is 19 inches.   Chest: Clear to auscultation without wheezing, rhonchi or crackles noted.  Heart: S1+S2+0, regular and normal without murmurs, rubs or gallops noted.   Abdomen: Soft, non-tender and non-distended with normal bowel sounds appreciated on auscultation.  Extremities: There is no pitting edema in the distal lower extremities bilaterally. Pedal pulses are intact.  Skin: Warm and dry without trophic changes noted. There are no varicose veins.  Musculoskeletal: exam reveals no obvious joint deformities, tenderness or joint swelling or erythema.   Neurologically:  Mental status: The patient is awake, alert and oriented in all 4 spheres. His memory, attention, language and knowledge are appropriate. There is no aphasia, agnosia, apraxia or anomia. Speech is clear with normal prosody and enunciation. Thought process is linear. Mood is congruent and affect is normal.  Cranial nerves are as described above under HEENT exam. In addition, shoulder shrug is normal with equal shoulder height noted. Motor exam: Normal bulk, strength and tone is noted. There is no drift, tremor or rebound. Romberg is negative. Reflexes are 2+ throughout. Toes are downgoing  bilaterally. Fine motor skills are intact with normal finger taps, normal hand movements, normal rapid alternating patting, normal foot taps and normal foot agility.  Cerebellar testing shows no dysmetria or intention tremor on finger to nose testing. Heel to shin is unremarkable bilaterally. There is no truncal or gait ataxia.  Sensory exam is  intact to light touch, pinprick, vibration, temperature sense and proprioception in the upper and lower extremities.  Gait, station and balance are unremarkable. No veering to one side is noted. No leaning to one side is noted. Posture is age-appropriate and stance is narrow based. No problems turning are noted. He turns en bloc. Tandem walk is unremarkable. Intact toe and heel stance is noted.               Assessment and Plan:   In summary, ARTIN MCEUEN is a 37 y.o.-year old male with a history and physical exam concerning for obstructive sleep apnea (OSA) and a prior Dx of severe OSA in 2008. He presents for re-evaluation and treatment of OSA.. I had a long chat with the patient about my findings and the diagnosis of OSA, its prognosis and treatment options. We talked about medical treatments and non-pharmacological approaches. I explained in particular the risks and ramifications of untreated moderate to severe OSA, especially with respect to developing cardiovascular disease down the Road, including congestive heart failure, difficult to treat hypertension, cardiac arrhythmias, or stroke. Even type 2 diabetes has in part been linked to untreated OSA. We talked about trying to maintain a healthy lifestyle in general, as well as the importance of weight control. I encouraged the patient to eat healthy, exercise daily and keep well hydrated, to keep a scheduled bedtime and wake time routine, to not skip any meals and eat healthy snacks in between meals. He is advised to reduce his alcohol intake. He can continue taking his clonazepam for sleep study.  I  recommended the following at this time: sleep study with potential positive airway pressure titration.  I explained the sleep test procedure to the patient and also outlined possible surgical and non-surgical treatment options of OSA, including the use of a custom-made dental device, upper airway surgical options, such as pillar implants, radiofrequency surgery, tongue base surgery, and UPPP. I also explained the CPAP treatment option to the patient, who indicated that he would be willing to try CPAP if the need arises. I explained the importance of being compliant with PAP treatment, not only for insurance purposes but primarily to improve His symptoms, and for the patient's long term health benefit, including to reduce His cardiovascular risks. I answered all his questions today and the patient was in agreement. I would like to see him back after the sleep study is completed and encouraged him to call with any interim questions, concerns, problems or updates.  Thank you very much for allowing me to participate in the care of this nice patient. If I can be of any further assistance to you please do not hesitate to call me at [phone].  Sincerely,   Huston Foley, MD, PhD

## 2013-07-14 NOTE — Patient Instructions (Addendum)
You have previously been diagnosed with severe obstructive sleep apnea or OSA, and I think we should proceed with a sleep study to determine the severity of your OSA and also initiate treatment with CPAP if indicated. If you have more than mild OSA, I want you to consider treatment with CPAP. Please remember, the risks and ramifications of moderate to severe obstructive sleep apnea or OSA are: Cardiovascular disease, including congestive heart failure, stroke, difficult to control hypertension, arrhythmias, and even type 2 diabetes has been linked to untreated OSA. Sleep apnea causes disruption of sleep and sleep deprivation in most cases, which, in turn, can cause recurrent headaches, problems with memory, mood, concentration, focus, and vigilance. Most people with untreated sleep apnea report excessive daytime sleepiness, which can affect their ability to drive. Please do not drive if you feel sleepy.  I will see you back after your sleep study to go over the test results and where to go from there. We will call you after your sleep study and to set up an appointment at the time.  Please bring all your nighttime medication for your sleep study.  Please remember to try to maintain good sleep hygiene, which means: Keep a regular sleep and wake schedule, try not to exercise or have a meal within 2 hours of your bedtime, try to keep your bedroom conducive for sleep, that is, cool and dark, without light distractors such as an illuminated alarm clock, and refrain from watching TV right before sleep or in the middle of the night and do not keep the TV or radio on during the night. Also, try not to use or play on electronic devices at bedtime, such as your cell phone, tablet PC or laptop. If you like to read at bedtime on an electronic device, try to dim the background light as much as possible. Do not eat in the middle of the night.   Please try to lose weight and reduce your alcohol intake.

## 2013-07-15 ENCOUNTER — Encounter: Payer: Self-pay | Admitting: Neurology

## 2013-07-22 ENCOUNTER — Ambulatory Visit
Admission: RE | Admit: 2013-07-22 | Discharge: 2013-07-22 | Disposition: A | Discharge status: Home / Self Care | Payer: 59 | Source: Ambulatory Visit | Attending: Internal Medicine | Admitting: Internal Medicine

## 2013-07-23 ENCOUNTER — Ambulatory Visit (INDEPENDENT_AMBULATORY_CARE_PROVIDER_SITE_OTHER): Payer: 59

## 2013-07-23 DIAGNOSIS — I10 Essential (primary) hypertension: Secondary | ICD-10-CM

## 2013-07-23 DIAGNOSIS — F411 Generalized anxiety disorder: Secondary | ICD-10-CM

## 2013-07-23 DIAGNOSIS — G4733 Obstructive sleep apnea (adult) (pediatric): Secondary | ICD-10-CM

## 2013-07-23 DIAGNOSIS — G479 Sleep disorder, unspecified: Secondary | ICD-10-CM

## 2013-07-31 ENCOUNTER — Telehealth: Payer: Self-pay | Admitting: Neurology

## 2013-07-31 DIAGNOSIS — G4733 Obstructive sleep apnea (adult) (pediatric): Secondary | ICD-10-CM

## 2013-07-31 NOTE — Telephone Encounter (Signed)
Please call and notify patient that the recent sleep study confirmed the diagnosis of severe OSA. He did very well with CPAP during the study with significant improvement of the respiratory events. Therefore, I would like start the patient on CPAP at home. I placed the order in the chart.   Arrange for CPAP set up at home through a DME company of patient's choice and fax/route report to PCP and referring MD (if other than PCP). He may already have a DME company as he currently is on auto-PAP.  The patient will also need a follow up appointment with me in 6-8 weeks post set up that has to be scheduled; help the patient schedule this (in a follow-up slot).   Please re-enforce the importance of compliance with treatment and the need for [place] to monitor compliance data.   Once you have spoken to the patient and scheduled the return appointment, you may close this encounter, thanks,   Huston Foley, MD, PhD [facility] ([facility])

## 2013-08-03 ENCOUNTER — Encounter: Payer: Self-pay | Admitting: *Deleted

## 2013-08-03 NOTE — Telephone Encounter (Signed)
I called and spoke with the patient about his sleep study results to inform his that the study confirmed the diagnosis of severe obstructive sleep apnea and he did well on CPAP. I also informed the patient that I will send his new setting to [facility]. Patient stated he would like a new machine and I'll let [facility] know and they'll check his benefits. I will send a copy to the patient and to his pcp.

## 2013-09-25 ENCOUNTER — Ambulatory Visit: Payer: 59 | Admitting: Internal Medicine

## 2013-12-25 DIAGNOSIS — R5383 Other fatigue: Secondary | ICD-10-CM | POA: Insufficient documentation

## 2014-01-06 ENCOUNTER — Encounter: Payer: Self-pay | Admitting: Internal Medicine

## 2014-01-06 ENCOUNTER — Ambulatory Visit (INDEPENDENT_AMBULATORY_CARE_PROVIDER_SITE_OTHER): Payer: 59 | Admitting: Internal Medicine

## 2014-01-06 VITALS — BP 122/64 | HR 91 | Temp 98.6°F | Resp 12 | Wt 252.0 lb

## 2014-01-06 DIAGNOSIS — E280 Estrogen excess: Secondary | ICD-10-CM

## 2014-01-06 DIAGNOSIS — E291 Testicular hypofunction: Secondary | ICD-10-CM

## 2014-01-06 DIAGNOSIS — N62 Hypertrophy of breast: Secondary | ICD-10-CM | POA: Insufficient documentation

## 2014-01-06 DIAGNOSIS — N529 Male erectile dysfunction, unspecified: Secondary | ICD-10-CM | POA: Insufficient documentation

## 2014-01-06 MED ORDER — CLOMIPHENE CITRATE 50 MG PO TABS: 25.0000 mg | ORAL_TABLET | Freq: Every day | ORAL | Status: DC

## 2014-01-06 NOTE — Progress Notes (Signed)
Patient ID: Tyler Gross, male   DOB: October 05, 1975, 38 y.o.   MRN: [id number]  HPI: Tyler Gross is a 38 y.o.-year-old man, returning for f/u for low testosterone and gynecomastia.   I saw the pt initially ~ 6 mo ago >> had lo Total testosterone, but normal free Test. Also had OTW normal pituitary w/u. He had gynecomastia ~ 3 cm, initially painful, then w/o pain.   Reviewed hx: He started to have Left breast pain fall/2014, but he has had enlargement for longer time. No nipple discharge or retraction. Breast ultrasound and mammogram: fibrocystic densities (read as gynecomastia) - report not available to review.   He was dx with hypogonadism in 2008: - 2008: Total testosterone 321, had pbs with erections then >> referred to urology : low T, LH also low >> saw endocrinology : PRL high >> had MRI pituitary >> no pituitary tumor. - 2011: seen at [facility] endocrinology >> PRL still high >> new pituitary MRI >> no tumor. Testosterone still slightly low >> advised to just watch it - 2014: testosterone 200s, referred again to endocrinology >> he is here now.   He had the L testicle resected in 1991 after trauma during swimming >> subsequent torsion. He has had R testicle U/S in 2001 >> no masses or torsion.  He admits for decreased libido Has difficulty maintaining an erection - Viagra works (but gets a HA and stuffy nose) + trauma to testes (see above), no testicular irradiation or surgery No h/o of mumps orchitis/h/o autoimmune ds. No h/o cryptorchidism He grew and went through puberty like his peers No shrinking of testes. No very small testes (<5 ml) No incomplete/delayed sexual development     + breast discomfort/gynecomastia    No loss of body hair (axillary/pubic)/decreased need for shaving No height loss but had osteopenia in R hip. No abnormal sense of smell. No hot flushes No vision problems No worst HA of his life No FH of hypogonadism/infertility No personal h/o  infertility, but he does not have children by choice No FH of hemochromatosis   + weight gain: 225 >> 237 in 1 year No chronic diseases No chronic pain. Not on opiates, does not take steroids.  Can have more than 2 drinks a day of alcohol at a time - some weeks 2-4 drinks a day No anabolic steroids use No herbal medicines On antidepressants: Bupropion and Celexa.  Pt is concerned about his new testosterone levels. Reviewed recent labs available per Care Everywhere - fasting, ~8:30 am - per pt: 12/31/2013: Total Testosterone 138 [phone]), free Testosterone 7.3 (8.7-25.1) 12/25/2013: Total Testosterone 166 [phone]), free Testosterone 6.7 (8.7-25.1)  He again feels pain in Left nipple.   Also, his TGs are now 400 >> started Lopid. His HbA1c is also higher (still in the prediabetic range) but he is concerned for developing DM2.   He gained ~ 15 lbs since 06/2013. He is more stressed at work and drinks more alcohol. He had CPAP check and the settings were not changed much.  ROS: per HPI + Constitutional: + weight gain, + fatigue, no subjective hyperthermia/hypothermia, + excessive urination Eyes: no blurry vision, no xerophthalmia ENT: no sore throat, no nodules palpated in throat, no dysphagia/odynophagia, no hoarseness Cardiovascular: no CP/SOB/palpitations/no leg swelling Respiratory: no cough/SOB Gastrointestinal: no N/V/D/C, + heartburn Musculoskeletal: no muscle/joint aches Skin: no rashes  Neurological: no tremors/numbness/tingling/dizziness  I reviewed pt's medications, allergies, PMH, social hx, family hx and no changes required, except as mentioned above.  PE:  BP 122/64  Pulse 91  Temp(Src) 98.6 F (37 C) (Oral)  Resp 12  Wt 252 lb (114.306 kg)  SpO2 97% Body mass index is 34.17 kg/(m^2).  Wt Readings from Last 3 Encounters:  01/06/14 252 lb (114.306 kg)  07/14/13 243 lb (110.224 kg)  06/25/13 237 lb 8 oz (107.729 kg)   Constitutional: obese, in NAD Eyes:  PERRLA, EOMI, no exophthalmos ENT: moist mucous membranes, no thyromegaly, no cervical lymphadenopathy Cardiovascular: RRR, No MRG Respiratory: CTA B Gastrointestinal: abdomen soft, NT, ND, BS+ Musculoskeletal: no deformities, strength intact in all 4 Skin: moist, warm, no rashes Neurological: no tremor with outstretched hands, DTR 3/4 in all  4  ASSESSMENT: 1. Low testosterone - normal free T Component     Latest Ref Rng 07/01/2013  WBC     4.5 - 10.5 K/uL 8.8  RBC     4.22 - 5.81 Mil/uL 5.01  Platelets     150.0 - 400.0 K/uL 259.0  Hemoglobin     13.0 - 17.0 g/dL 15.3  HCT     39.0 - 52.0 % 44.8  MCV     78.0 - 100.0 fl 89.3  MCHC     30.0 - 36.0 g/dL 34.2  RDW     11.5 - 14.6 % 13.2  Testosterone     300 - 890 ng/dL 337  Sex Hormone Binding     13 - 71 nmol/L 28  Testosterone Free     47.0 - 244.0 pg/mL 73.6  Testosterone-% Free     1.6 - 2.9 % 2.2  Estradiol, Free      0.51 (H)  Estradiol      24  Results received      07/09/13  Iron     42 - 165 ug/dL 88  Transferrin     212.0 - 360.0 mg/dL 311.8  Saturation Ratios     20.0 - 50.0 % 20.2  LH     1.50 - 9.30 mIU/mL 3.38  FSH     1.4 - 18.1 mIU/ML 5.3  Somatomedin (IGF-I)     73 - 330 ng/mL 113  hCG, Beta Chain, Quant, S      0.17  TSH     0.35 - 5.50 uIU/mL 1.39  Free T4     0.60 - 1.60 ng/dL 0.70  T3, Free     2.3 - 4.2 pg/mL 2.9  Hemoglobin A1C     4.6 - 6.5 % 5.8  PSA     0.10 - 4.00 ng/mL 1.33   All normal, except slightly high Estradiol.  Other labs/investigations: - He had severe acne outbreaks >> 2x 2h urine for cortisol in 2008 and 2012 was normal - reportedly. - OGTT 2 years ago >> normal. HbA1c from 2012: 5.5%. - prolactin: 08/27/2011: 11.6 02/08/2012: 11 06/02/2013: 14.7 - TSH: 09/27/2009: 1.850 10/02/2010: 1.300 07/02/2011: 2.799 06/27/2012: 0.670 - CMP: 03/11/2013: Normal except glucose 104 - lipids: 008/01/2013: 229/343/46/114 - vitamin D: 07/02/2011: 47 - Vitamin  B12: 09/27/2009: 616  2. ED  3. High estradiol - testicular U/S 07/22/2013: Negative. No evidence for testicular mass or other significant abnormality in the solitary right testicle. The left testicular prosthesis is unremarkable in appearance.  4. Gynecomastia L>R - fibrocystic changes bilaterally per mammogram and U/S - I do not have the full report but gynecomastia is palpable ~3 cm bilaterally  PLAN:  1. Low testosterone - pt's testosterone level is now very low, but I have low suspicion for a pituitary  adenoma after 2 pituitary MRIs that were normal (2008 and 2011) and aftre a negative pituitary w/u 6 mo ago.  We identified several factors that can contribute to his hypogonadism:  - stress (has increased stress at work)  - weigh gain (15 lbs since 06/2013)  - increased alcohol use lately (has cut down in last 2 weeks)  - his OSA  - his use of Cymbalta - we discussed about controlling his diet (advised him to cut down fat and meat and increase the use of fruit and vegetables >> discussed at length about a plant-based diet, with specific examples  - handout given). I underlined the fact that the diet is the most important determinant of his weight gain and probably testosterone decrease, too. - he tells me he is coming off Cymbalta.  - we also discussed it is reassuring that he had 2 normal pituitary MRIs - I suggested to start Clomiphene 25 mg daily to increase the testosterone while he is working on the above lifestyle changes  - will stop Clomiphene 2 mo prior to our next appt in 6 mo - will check labs then and if Testost. Still low despite weight loss and other lifestyle changes >> we may repeat a pituitary MRI  2. Erectile dysfunction - continue PDE5 inhibitor  4. High estradiol - start Clomiphene  - see above  4. Gynecomastia L>R - fibrocystic changes bilaterally per mammogram and U/S - we again discussed possible etiologies, frequently 2/2 increased ratio of  Estrogen/Testosterone - this is especially true since the pain disappeared when testosterone improved 6 mo ago and has returned when Testosterone decreased - Clomiphene is a selective hypothalamic and pituitary estrogen R blocker >> will help with Testosterone levels and indirectly with gynecomastia  - time spent with the patient: 45 hour, of which >50% was spent in obtaining information about his symptoms, reviewing previous labs, counseling him about potential  Causes for low Testosterone and gynecomastia and suggestions about how to correct them (please see the discussed topics above), and developing a plan to treat and further investigate it. He had a number of questions which I addressed.

## 2014-01-06 NOTE — Patient Instructions (Signed)
Please start Clomiphene 25 mg daily. Stop this 2 months before you come back to see me in 6 months.  Please consider the following ways to cut down carbs and fat and increase fiber and micronutrients in your diet: - substitute whole grain for white bread or pasta - substitute brown rice for white rice - substitute 90-calorie flat bread pieces for slices of bread when possible - substitute sweet potatoes or yams for white potatoes - substitute humus for margarine - substitute tofu for cheese when possible - substitute almond or rice milk for regular milk (would not drink soy milk daily due to concern for soy estrogen influence on breast cancer risk) - substitute dark chocolate for other sweets when possible - substitute water - can add lemon or orange slices for taste - for diet sodas (artificial sweeteners will trick your body that you can eat sweets without getting calories and will lead you to overeating and weight gain in the long run) - do not skip breakfast or other meals (this will slow down the metabolism and will result in more weight gain over time)  - can try smoothies made from fruit and almond/rice milk in am instead of regular breakfast - can also try old-fashioned (not instant) oatmeal made with almond/rice milk in am - order the dressing on the side when eating salad at a restaurant (pour less than half of the dressing on the salad) - eat as little meat as possible - can try juicing, but should not forget that juicing will get rid of the fiber, so would alternate with eating raw veg./fruits or drinking smoothies - use as little oil as possible, even when using olive oil - can dress a salad with a mix of balsamic vinegar and lemon juice, for e.g. - use agave nectar, stevia sugar, or regular sugar rather than artificial sweateners - steam or broil/roast veggies  - snack on veggies/fruit/nuts (unsalted, preferably) when possible, rather than processed foods - reduce or eliminate  aspartame in diet (it is in diet sodas, chewing gum, etc) Read the labels!  Try to read Dr. Janene Harvey book: "Program for Reversing Diabetes" for the vegan concept and other ideas for healthy eating.  Try to replace snacking on these with drinking/eating: * soy or almond milk * veggies with humus or other low calorie/low fat dip * low glycemic index fruits (higher glycemic index = higher risk to increase your sugars):          http://www.health.http://flores-mcbride.com/ * fruit/veggie smoothies          Ninja blender recipes:          https://www.moore-west.com/ * unsalted nuts Etc.  Plant-based diet materials: - Lectures (you tube):  Alyssa Grove: "Breaking the Food Seduction"  Doug Lisle: "How to Lose Weight, without Losing Your Mind"  Shari Heritage: "What is Insulin Resistance" https://www.woods-mathews.com/ - Documentaries:  [place] over [employer], Sick and Nearly Dead  [employer] - Books:  Alyssa Grove: "Program for Reversing Diabetes"  Heath Gold: "The [place] Study"  Norma Fredrickson: "Supermarket Vegan" (cookbook) - Facebook pages:   Moshe Salisbury versus Knives  Vegucated  [place] [employer] - Healthy nutrition info websites:  https://www.martin.info/

## 2014-01-26 ENCOUNTER — Telehealth: Payer: Self-pay | Admitting: Internal Medicine

## 2014-01-26 MED ORDER — CLOMIPHENE CITRATE 50 MG PO TABS: 25.0000 mg | ORAL_TABLET | Freq: Every day | ORAL | Status: DC

## 2014-01-26 NOTE — Telephone Encounter (Signed)
Requested call back to discuss.  

## 2014-01-26 NOTE — Telephone Encounter (Signed)
It can >> advise to stop for 1 week and then, if vision is better >> start at 0.5 mg every other day, then increase back to the current dose of 0.5 mg daily as tolerated.

## 2014-01-26 NOTE — Telephone Encounter (Signed)
Pt called back stating he is having some blurred vision that is causing vertigo. He wanted to know if the Clomid could be causing this? Please advise, Thanks!

## 2014-01-26 NOTE — Telephone Encounter (Signed)
Patient would like to speak with nurse of Dr. Cruzita Lederer regarding medication concerns  [phone]  Please call patient

## 2014-01-27 NOTE — Telephone Encounter (Signed)
Called pt and advised him per Dr Arman Filter note. Advised pt to let us know how his vision is in one week. Pt understood. Be advised.

## 2014-06-16 ENCOUNTER — Telehealth: Payer: Self-pay | Admitting: Internal Medicine

## 2014-06-16 NOTE — Telephone Encounter (Signed)
Pt would like to know if he needs to have labs before his appt

## 2014-06-16 NOTE — Telephone Encounter (Signed)
Please read note below and advise.  

## 2014-06-17 NOTE — Telephone Encounter (Signed)
Called pt and lvm advising him per Dr Arman Filter note.

## 2014-06-17 NOTE — Telephone Encounter (Signed)
No, I usually do them when they come in.

## 2014-07-08 ENCOUNTER — Encounter: Payer: Self-pay | Admitting: Internal Medicine

## 2014-07-08 ENCOUNTER — Ambulatory Visit (INDEPENDENT_AMBULATORY_CARE_PROVIDER_SITE_OTHER): Payer: 59 | Admitting: Internal Medicine

## 2014-07-08 VITALS — BP 120/74 | HR 83 | Temp 98.1°F | Resp 12 | Wt 249.8 lb

## 2014-07-08 DIAGNOSIS — E291 Testicular hypofunction: Secondary | ICD-10-CM

## 2014-07-08 DIAGNOSIS — N62 Hypertrophy of breast: Secondary | ICD-10-CM

## 2014-07-08 DIAGNOSIS — N529 Male erectile dysfunction, unspecified: Secondary | ICD-10-CM

## 2014-07-08 DIAGNOSIS — E280 Estrogen excess: Secondary | ICD-10-CM

## 2014-07-08 LAB — LUTEINIZING HORMONE: LH: 2.93 m[IU]/mL (ref 1.50–9.30)

## 2014-07-08 LAB — FOLLICLE STIMULATING HORMONE: FSH: 5.2 m[IU]/mL (ref 1.4–18.1)

## 2014-07-08 NOTE — Patient Instructions (Signed)
Please stop at the lab. We will see if we need to restart Clomid. Please come back for a follow-up appointment in 6 months

## 2014-07-08 NOTE — Progress Notes (Signed)
Patient ID: Tyler Gross, male   DOB: [birth year], 38 y.o.   MRN: [id number]  HPI: Tyler Gross is a 38 y.o.-year-old man, returning for f/u for low testosterone and gynecomastia. Last visit 6 mo ago.  I saw the pt initially 1 year ago >> had low Total testosterone, but normal free Test. After this, he had repeat checks and both total and free T were low. He had OTW normal pituitary w/u and 2 normal pituitary MRIs. He had gynecomastia ~3 cm, initially painful, now pain resolved.   Reviewed hx: He started to have Left breast pain fall/2014, but he has had enlargement for longer time. No nipple discharge or retraction. Breast ultrasound and mammogram: fibrocystic densities (read as gynecomastia) - report not available to review.   He was dx with hypogonadism in 2008: - 2008: Total testosterone 321, had pbs with erections then >> referred to urology : low T, LH also low >> saw endocrinology : PRL high >> had MRI pituitary >> no pituitary tumor. - 2011: seen at [facility] endocrinology >> PRL still high >> new pituitary MRI >> no tumor. Testosterone still slightly low >> advised to just watch it - 2014: testosterone 200s, referred again to endocrinology >> he is here now.   He had the L testicle resected in 1991 after trauma during swimming >> subsequent torsion. He has had R testicle U/S in 2001 >> no masses or torsion.  He admits for decreased libido Has difficulty maintaining an erection - Viagra works (but gets a HA and stuffy nose) + trauma to testes (see above), no testicular irradiation or surgery No h/o of mumps orchitis/h/o autoimmune ds. No h/o cryptorchidism He grew and went through puberty like his peers No shrinking of testes. No very small testes (<5 ml) No incomplete/delayed sexual development     + breast discomfort/gynecomastia    No loss of body hair (axillary/pubic)/decreased need for shaving No height loss but had osteopenia in R hip. No abnormal sense of  smell. No hot flushes No vision problems No worst HA of his life No FH of hypogonadism/infertility No personal h/o infertility, but he does not have children by choice No FH of hemochromatosis   + weight gain: 225 >> 237 in 1 year No chronic diseases No chronic pain. Not on opiates, does not take steroids.  Can have more than 2 drinks a day of alcohol at a time - some weeks 2-4 drinks a day No anabolic steroids use No herbal medicines On antidepressants: Bupropion and Celexa.  Reviewed testosterone levels available: 12/31/2013: Total Testosterone 138 [phone]), free Testosterone 7.3 (8.7-25.1) 12/25/2013: Total Testosterone 166 [phone]), free Testosterone 6.7 (8.7-25.1) Component     Latest Ref Rng 07/01/2013  Testosterone     300 - 890 ng/dL 337  Sex Hormone Binding     13 - 71 nmol/L 28  Testosterone Free     47.0 - 244.0 pg/mL 73.6  Testosterone-% Free     1.6 - 2.9 % 2.2  Estradiol, Free      0.51 (H)  Estradiol      24  Results received      07/09/13  LH     1.50 - 9.30 mIU/mL 3.38  FSH     1.4 - 18.1 mIU/ML 5.3  Pituitary w/u was OTW negative in 06/2013.  He had gynecomastia and  pain in Left nipple, now resolved. Mammogram and breast U/S show only fb-cystic changes.  We discussed ways to improve his testosterone levels, and  started Clomiphene 1/2 of a 50 mcg TAB daily. He called c/o blurred vision and vertigo >> held Clomid and then later restarted. He then realized that the reason for his sxs was stopping his antidepressants (Cymbalta, Klonopin), and not Clomid.  On Clomid, his repeated testosterone was much improved. He felt better on Clomid, sex drive was better.  He is now off the med x 1 mo in preparation for this appt.  He tells me his PCP sent him for a second opinion to another endocrinologist >> he had labs there, testosterone close to normal (while on Clomid) >> he was again recommended to lose weight and was also not recommended testosterone  replacement.   TG 400 >> on Lopid.  His HbA1c was higher (still in the prediabetic range). Last HbA1c was 5.6% (06/17/2014). He gained ~ 15 lbs from 06/2013-01/2014. He was more stressed at work and drinks more alcohol. Since last visit, he increased his weight to 261 lbs >> then decreased to 247. Now on Qsymia.  He had CPAP check and the settings were not changed much.  ROS: per HPI + Constitutional: + weight gain + loss, no fatigue, no subjective hyperthermia/hypothermia Eyes: no blurry vision, no xerophthalmia ENT: no sore throat, no nodules palpated in throat, no dysphagia/odynophagia, no hoarseness Cardiovascular: no CP/SOB/palpitations/no leg swelling Respiratory: no cough/SOB Gastrointestinal: no N/V/D/C, + heartburn Musculoskeletal: no muscle/joint aches Skin: no rashes  Neurological: no tremors/numbness/tingling/dizziness  I reviewed pt's medications, allergies, PMH, social hx, family hx and no changes required, except as mentioned above.  PE: BP 120/74 mmHg  Pulse 83  Temp(Src) 98.1 F (36.7 C) (Oral)  Resp 12  Wt 249 lb 12.8 oz (113.309 kg)  SpO2 97% Body mass index is 33.87 kg/(m^2).  Wt Readings from Last 3 Encounters:  07/08/14 249 lb 12.8 oz (113.309 kg)  01/06/14 252 lb (114.306 kg)  07/14/13 243 lb (110.224 kg)   Constitutional: obese, in NAD Eyes: PERRLA, EOMI, no exophthalmos ENT: moist mucous membranes, no thyromegaly, no cervical lymphadenopathy Cardiovascular: RRR, No MRG Respiratory: CTA B Gastrointestinal: abdomen soft, NT, ND, BS+ Musculoskeletal: no deformities, strength intact in all 4 Skin: moist, warm, no rashes Neurological: no tremor with outstretched hands, DTR 3/4 in all  4  ASSESSMENT: 1. Low testosterone - normal free T Component     Latest Ref Rng 07/01/2013  WBC     4.5 - 10.5 K/uL 8.8  RBC     4.22 - 5.81 Mil/uL 5.01  Platelets     150.0 - 400.0 K/uL 259.0  Hemoglobin     13.0 - 17.0 g/dL 15.3  HCT     39.0 - 52.0 % 44.8   MCV     78.0 - 100.0 fl 89.3  MCHC     30.0 - 36.0 g/dL 34.2  RDW     11.5 - 14.6 % 13.2  Testosterone     300 - 890 ng/dL 337  Sex Hormone Binding     13 - 71 nmol/L 28  Testosterone Free     47.0 - 244.0 pg/mL 73.6  Testosterone-% Free     1.6 - 2.9 % 2.2  Estradiol, Free      0.51 (H)  Estradiol      24  Results received      07/09/13  Iron     42 - 165 ug/dL 88  Transferrin     212.0 - 360.0 mg/dL 311.8  Saturation Ratios     20.0 - 50.0 % 20.2  LH     1.50 - 9.30 mIU/mL 3.38  FSH     1.4 - 18.1 mIU/ML 5.3  Somatomedin (IGF-I)     73 - 330 ng/mL 113  hCG, Beta Chain, Quant, S      0.17  TSH     0.35 - 5.50 uIU/mL 1.39  Free T4     0.60 - 1.60 ng/dL 0.70  T3, Free     2.3 - 4.2 pg/mL 2.9  Hemoglobin A1C     4.6 - 6.5 % 5.8  PSA     0.10 - 4.00 ng/mL 1.33   All normal, except slightly high Estradiol.  Other labs/investigations: - He had severe acne outbreaks >> 2x 2h urine for cortisol in 2008 and 2012 was normal - reportedly. - OGTT 2 years ago >> normal. HbA1c from 2012: 5.5%. - prolactin: 08/27/2011: 11.6 02/08/2012: 11 06/02/2013: 14.7 - TSH: 09/27/2009: 1.850 10/02/2010: 1.300 07/02/2011: 2.799 06/27/2012: 0.670 - CMP: 03/11/2013: Normal except glucose 104 - lipids: 008/01/2013: 229/343/46/114 - vitamin D: 07/02/2011: 47 - Vitamin B12: 09/27/2009: 616  2. ED  3. High estradiol - testicular U/S 07/22/2013: Negative. No evidence for testicular mass or other significant abnormality in the solitary right testicle. The left testicular prosthesis is unremarkable in appearance.  4. Gynecomastia L>R - fibrocystic changes bilaterally per mammogram and U/S - gynecomastia is palpable ~3 cm bilaterally  PLAN:  1. Low testosterone - pt's testosterone level is low, but I have low suspicion for a pituitary adenoma after 2 pituitary MRIs that were normal (2008 and 2011) and after a negative pituitary w/u 1 year ago.  He has several factors that can  contribute to his hypogonadism:  - stress (has waxing and waning stress at work)  - obesity (he is making progress with this - is on Qsymia and is in a weight loss pgm in [place]: Dr Norberta Keens - [facility])  - increased alcohol use (not as much lately)  - his OSA  - he was previously on Cymbalta, now off - we decided to recheck his testosterone and see if he needs to continue Clomid until he loses more weight and his testosterone improves - continue Clomiphene 25 mg daily to increase the testosterone while he is working on the above lifestyle changes   2. Erectile dysfunction - continue PDE5 inhibitor  4. High estradiol - on Clomiphene  - see above  4. Gynecomastia L>R - fibrocystic changes bilaterally per mammogram and U/S - Clomiphene is a selective hypothalamic and pituitary estrogen R blocker >> this also really helped his GM >> no more breast pain  gynecomastia   Component     Latest Ref Rng 07/08/2014  Testosterone     300 - 890 ng/dL 243 (L)  Sex Hormone Binding     13 - 71 nmol/L 24  Testosterone Free     47.0 - 244.0 pg/mL 55.8  Testosterone-% Free     1.6 - 2.9 % 2.3  Estradiol, Free     ADULTS: < OR = 0.45 pg/mL 0.65 (H)  Estradiol     ADULTS: < OR = 29 pg/mL 32 (H)  LH     1.50 - 9.30 mIU/mL 2.93  FSH     1.4 - 18.1 mIU/ML 5.2   Total testosterone low, however, the free testosterone normal. His SHBG is very low, which will improve after losing more weight. He is estradiol refill be higher, I wonder if this is a rebound from stopping clomiphene. At this  point, we can either stay off clomiphene or continue, I will let him decide.

## 2014-07-09 LAB — TESTOSTERONE, FREE, TOTAL, SHBG
Sex Hormone Binding: 24 nmol/L (ref 13–71)
TESTOSTERONE FREE: 55.8 pg/mL (ref 47.0–244.0)
TESTOSTERONE-% FREE: 2.3 % (ref 1.6–2.9)
Testosterone: 243 ng/dL — ABNORMAL LOW (ref 300–890)

## 2014-07-14 LAB — ESTRADIOL, FREE
Estradiol, Free: 0.65 pg/mL — ABNORMAL HIGH (ref <=0.45)
Estradiol: 32 pg/mL — ABNORMAL HIGH (ref <=29)

## 2014-11-01 ENCOUNTER — Other Ambulatory Visit: Payer: Self-pay | Admitting: *Deleted

## 2014-11-01 MED ORDER — CLOMIPHENE CITRATE 50 MG PO TABS: 25.0000 mg | ORAL_TABLET | Freq: Every day | ORAL | Status: DC

## 2014-11-02 ENCOUNTER — Other Ambulatory Visit: Payer: Self-pay | Admitting: *Deleted

## 2014-11-02 MED ORDER — CLOMIPHENE CITRATE 50 MG PO TABS: 25.0000 mg | ORAL_TABLET | Freq: Every day | ORAL | Status: DC

## 2014-11-02 NOTE — Telephone Encounter (Signed)
Pt requested rx refill be sent to [facility], [place].

## 2014-12-06 ENCOUNTER — Other Ambulatory Visit: Payer: Self-pay | Admitting: *Deleted

## 2014-12-06 MED ORDER — CLOMIPHENE CITRATE 50 MG PO TABS: 25.0000 mg | ORAL_TABLET | Freq: Every day | ORAL | Status: DC

## 2015-01-07 ENCOUNTER — Ambulatory Visit (INDEPENDENT_AMBULATORY_CARE_PROVIDER_SITE_OTHER): Payer: 59 | Admitting: Internal Medicine

## 2015-01-07 ENCOUNTER — Encounter: Payer: Self-pay | Admitting: Internal Medicine

## 2015-01-07 VITALS — BP 106/68 | HR 71 | Temp 98.3°F | Resp 12 | Wt 257.0 lb

## 2015-01-07 DIAGNOSIS — E280 Estrogen excess: Secondary | ICD-10-CM

## 2015-01-07 DIAGNOSIS — E291 Testicular hypofunction: Secondary | ICD-10-CM

## 2015-01-07 DIAGNOSIS — N62 Hypertrophy of breast: Secondary | ICD-10-CM | POA: Diagnosis not present

## 2015-01-07 MED ORDER — CLOMIPHENE CITRATE 50 MG PO TABS: 25.0000 mg | ORAL_TABLET | Freq: Every day | ORAL | Status: DC

## 2015-01-07 NOTE — Progress Notes (Signed)
Patient ID: Tyler Gross, male   DOB: 06-02-1976, 39 y.o.   MRN: [id number]  HPI: Tyler Gross is a 39 y.o.-year-old man, returning for f/u for low testosterone and gynecomastia. Last visit 6 mo ago.  I saw the pt initially 1.5 years ago >> had low Total testosterone, but normal free Test. After this, he had repeat checks and both total and free T were low.   At previous visits, we discussed ways to improve his testosterone levels, and started Clomiphene 1/2 of a 50 mcg TAB daily. He called c/o blurred vision and vertigo >> held Clomid and then later restarted. He then realized that the reason for his sxs was stopping his antidepressants (Cymbalta, Klonopin), and not Clomid.  On Clomid, his repeated testosterone was much improved. He felt better on Clomid, sex drive was better.  However, his free testosterone was normal 1 month after stopping clomiphene at last visit. At that point, I gave him the option to continue to clomiphene or continue to work on his diet and exercise to improve his testosterone levels. He continues 25 mg Clomid daily.  He had OTW normal pituitary w/u and 2 normal pituitary MRIs.   He had gynecomastia ~3 cm, initially painful, now pain resolved. Mammogram and breast U/S show only fb-cystic changes.  Reviewed hx: He started to have Left breast pain fall/2014, but he has had enlargement for longer time. No nipple discharge or retraction. Breast ultrasound and mammogram: fibrocystic densities (read as gynecomastia) - report not available to review.   He was dx with hypogonadism in 2008: - 2008: Total testosterone 321, had pbs with erections then >> referred to urology : low T, LH also low >> saw endocrinology : PRL high >> had MRI pituitary >> no pituitary tumor. - 2011: seen at [facility] endocrinology >> PRL still high >> new pituitary MRI >> no tumor. Testosterone still slightly low >> advised to just watch it - 2014: testosterone 200s, referred again to  endocrinology >> he is here now.   He had the L testicle resected in 1991 after trauma during swimming >> subsequent torsion. He has had R testicle U/S in 2001 >> no masses or torsion.  He admits for decreased libido Has difficulty maintaining an erection - Viagra works (but gets a HA and stuffy nose) + trauma to testes (see above), no testicular irradiation or surgery No h/o of mumps orchitis/h/o autoimmune ds. No h/o cryptorchidism He grew and went through puberty like his peers No shrinking of testes. No very small testes (<5 ml) No incomplete/delayed sexual development     + breast discomfort/gynecomastia    No loss of body hair (axillary/pubic)/decreased need for shaving No height loss but had osteopenia in R hip. No abnormal sense of smell. No hot flushes No vision problems No worst HA of his life No FH of hypogonadism/infertility No personal h/o infertility, but he does not have children by choice No FH of hemochromatosis   + weight gain: 225 >> 237 in 1 year No chronic diseases No chronic pain. Not on opiates, does not take steroids.  Can have more than 2 drinks a day of alcohol at a time - some weeks 2-4 drinks a day No anabolic steroids use No herbal medicines On antidepressants: Bupropion and Celexa.  Reviewed testosterone levels available: Component     Latest Ref Rng 07/08/2014  Testosterone     300 - 890 ng/dL 243 (L)  Sex Hormone Binding     13 - 71  nmol/L 24  Testosterone Free     47.0 - 244.0 pg/mL 55.8  Testosterone-% Free     1.6 - 2.9 % 2.3  Estradiol, Free     ADULTS: < OR = 0.45 pg/mL 0.65 (H)  Estradiol     ADULTS: < OR = 29 pg/mL 32 (H)  LH     1.50 - 9.30 mIU/mL 2.93  FSH     1.4 - 18.1 mIU/ML 5.2   12/31/2013: Total Testosterone 138 [phone]), free Testosterone 7.3 (8.7-25.1) 12/25/2013: Total Testosterone 166 [phone]), free Testosterone 6.7 (8.7-25.1) Component     Latest Ref Rng 07/01/2013  Testosterone     300 - 890 ng/dL 337  Sex  Hormone Binding     13 - 71 nmol/L 28  Testosterone Free     47.0 - 244.0 pg/mL 73.6  Testosterone-% Free     1.6 - 2.9 % 2.2  Estradiol, Free      0.51 (H)  Estradiol      24  Results received      07/09/13  LH     1.50 - 9.30 mIU/mL 3.38  FSH     1.4 - 18.1 mIU/ML 5.3   He had a second opinion with another endocrinologist in 2015 >> he had labs there, testosterone close to normal (while on Clomid) >> he was again recommended to lose weight and was also not recommended testosterone replacement.   TG 400 >> on Lopid.  His HbA1c was higher (still in the prediabetic range). He gained ~ 15 lbs from 06/2013-01/2014. He is going to the [facility] weight loss program. He is on CPAP.  ROS: per HPI + Constitutional: + weight gain, no fatigue, no subjective hyperthermia/hypothermia Eyes: no blurry vision, no xerophthalmia ENT: no sore throat, no nodules palpated in throat, no dysphagia/odynophagia, no hoarseness Cardiovascular: no CP/SOB/palpitations/no leg swelling Respiratory: no cough/SOB Gastrointestinal: no N/V/D/C, no heartburn Musculoskeletal: no muscle/joint aches Skin: no rashes  Neurological: no tremors/numbness/tingling/dizziness  I reviewed pt's medications, allergies, PMH, social hx, family hx, and changes were documented in the history of present illness. Otherwise, unchanged from my initial visit note. He decreased HCTZ to 12.5 mg. Stopped Qsymia.   PE: BP 106/68 mmHg  Pulse 71  Temp(Src) 98.3 F (36.8 C) (Oral)  Resp 12  Wt 257 lb (116.574 kg)  SpO2 95% Body mass index is 34.85 kg/(m^2).  Wt Readings from Last 3 Encounters:  01/07/15 257 lb (116.574 kg)  07/08/14 249 lb 12.8 oz (113.309 kg)  01/06/14 252 lb (114.306 kg)   Constitutional: obese, in NAD Eyes: PERRLA, EOMI, no exophthalmos ENT: moist mucous membranes, no thyromegaly, no cervical lymphadenopathy Cardiovascular: RRR, No MRG Respiratory: CTA B Gastrointestinal: abdomen soft, NT, ND,  BS+ Musculoskeletal: no deformities, strength intact in all 4 Skin: moist, warm, no rashes Neurological: no tremor with outstretched hands, DTR 3/4 in all  4  ASSESSMENT: 1. Low testosterone - normal free T  Other labs/investigations: - He had severe acne outbreaks >> 2x 2h urine for cortisol in 2008 and 2012 was normal - reportedly. - OGTT 2 years ago >> normal. HbA1c from 2012: 5.5%. - prolactin: 08/27/2011: 11.6 02/08/2012: 11 06/02/2013: 14.7 - TSH: 09/27/2009: 1.850 10/02/2010: 1.300 07/02/2011: 2.799 06/27/2012: 0.670 - CMP: 03/11/2013: Normal except glucose 104 - lipids: 008/01/2013: 229/343/46/114 - vitamin D: 07/02/2011: 47 - Vitamin B12: 09/27/2009: 616  2. ED  3. High estradiol - testicular U/S 07/22/2013: Negative. No evidence for testicular mass or other significant abnormality in the solitary  right testicle. The left testicular prosthesis is unremarkable in appearance.  4. Gynecomastia L>R - fibrocystic changes bilaterally per mammogram and U/S - gynecomastia palpable ~3 cm bilaterally  PLAN:  1. Low testosterone - pt's testosterone level is low, but I have low suspicion for a pituitary adenoma since he had 2 pituitary MRIs that were normal (2008 and 2011) and a negative pituitary w/u 1 year ago.  He has (and had) several factors that can contribute to his hypogonadism:  - obesity (he gained weight since last visit) - advised to look up the [facility] loss [facility] here in [place]  - stress (has waxing and waning stress at work)  - increased alcohol use (not much lately)  - his OSA  - he was previously on Cymbalta, now off - will recheck his testosterone today - continue Clomiphene 25 mg daily to increase the testosterone while he is working on the above lifestyle changes   2. Erectile dysfunction - continue PDE5 inhibitor  4. High estradiol - on Clomiphene  - see above - normal testicular U/S in the past  4. Gynecomastia L>R - fibrocystic changes  bilaterally per mammogram and U/S - Clomiphene is a selective hypothalamic and pituitary estrogen R blocker >> this also really helped his GM >> no more breast pain    Office Visit on 01/07/2015  Component Date Value Ref Range Status  . Testosterone 01/07/2015 581  300 - 890 ng/dL Final   Comment:           Tanner Stage       Male              Male               I              < 30 ng/dL        < 10 ng/dL               II             < 150 ng/dL       < 30 ng/dL               III            100-320 ng/dL     < 35 ng/dL               IV             200-970 ng/dL     15-40 ng/dL               V/Adult        300-890 ng/dL     10-70 ng/dL     . Sex Hormone Binding 01/07/2015 23  10 - 50 nmol/L Final  . Testosterone, Free 01/07/2015 150.6  47.0 - 244.0 pg/mL Final   Comment:   The concentration of free testosterone is derived from a mathematical expression based on constants for the binding of testosterone to sex hormone-binding globulin and albumin.   . Testosterone-% Free 01/07/2015 2.6  1.6 - 2.9 % Final   Excellent result!!!

## 2015-01-07 NOTE — Patient Instructions (Signed)
Please continue Clomid 25 mg daily.  Please stop at the lab.  Please return in 1 year.

## 2015-01-10 LAB — TESTOSTERONE, FREE, TOTAL, SHBG
Sex Hormone Binding: 23 nmol/L (ref 10–50)
TESTOSTERONE-% FREE: 2.6 % (ref 1.6–2.9)
TESTOSTERONE: 581 ng/dL (ref 300–890)
Testosterone, Free: 150.6 pg/mL (ref 47.0–244.0)

## 2015-03-07 ENCOUNTER — Other Ambulatory Visit: Payer: Self-pay | Admitting: *Deleted

## 2015-03-07 MED ORDER — CLOMIPHENE CITRATE 50 MG PO TABS: 25.0000 mg | ORAL_TABLET | Freq: Every day | ORAL | Status: DC

## 2015-07-11 DIAGNOSIS — Z202 Contact with and (suspected) exposure to infections with a predominantly sexual mode of transmission: Secondary | ICD-10-CM | POA: Insufficient documentation

## 2016-01-09 ENCOUNTER — Ambulatory Visit (INDEPENDENT_AMBULATORY_CARE_PROVIDER_SITE_OTHER): Payer: Managed Care, Other (non HMO) | Admitting: Internal Medicine

## 2016-01-09 ENCOUNTER — Encounter: Payer: Self-pay | Admitting: Internal Medicine

## 2016-01-09 VITALS — BP 128/82 | HR 93 | Temp 98.0°F | Resp 12 | Wt 268.0 lb

## 2016-01-09 DIAGNOSIS — E291 Testicular hypofunction: Secondary | ICD-10-CM

## 2016-01-09 DIAGNOSIS — N62 Hypertrophy of breast: Secondary | ICD-10-CM

## 2016-01-09 NOTE — Patient Instructions (Signed)
Please stop at the lab.  Continue Clomid 25 mg daily.  Please return in 1 year.

## 2016-01-09 NOTE — Progress Notes (Signed)
Patient ID: Tyler Gross, male   DOB: [birth year], 40 y.o.   MRN: [id number]  HPI: Tyler Gross is a 40 y.o.-year-old man, returning for f/u for low testosterone and gynecomastia. Last visit 1 year ago.  I saw the pt initially 2.5 years ago >> had low Total testosterone, but normal free Test. After this, he had repeat checks and both total and free T were low.   At previous visits, we discussed ways to improve his testosterone levels, and started Clomiphene 1/2 of a 50 mcg TAB daily. He called c/o blurred vision and vertigo >> held Clomid and then later restarted. He then realized that the reason for his sxs was stopping his antidepressants (Cymbalta, Klonopin), and not Clomid.  On Clomid, his repeated testosterone was much improved, including the level obtained at last visit (see below), which was normal. We then discussed about 2 options: to continue clomiphene or continue to work on his diet and exercise to improve his testosterone levels. He opted to stay on 25 mg Clomid daily.  He had OTW normal pituitary w/u in the pastand 2 normal pituitary MRIs.   He had gynecomastia ~3 cm, initially painful, now pain resolved. Mammogram and breast U/S show only fb-cystic changes.  Reviewed hx: He started to have Left breast pain fall/2014, but he has had enlargement for longer time. No nipple discharge or retraction. Breast ultrasound and mammogram: fibrocystic densities (read as gynecomastia) - report not available to review.   He was dx with hypogonadism in 2008: - 2008: Total testosterone 321, had pbs with erections then >> referred to urology : low T, LH also low >> saw endocrinology : PRL high >> had MRI pituitary >> no pituitary tumor. - 2011: [facility] >> PRL still high >> new pituitary MRI >> no tumor. Testosterone still slightly low >> advised to just watch it - 2014: testosterone 200s, referred again to endocrinology   He had the L testicle resected in 1991  after trauma during swimming >> subsequent torsion. He has had R testicle U/S in 2001 >> no masses or torsion.  He admits for decreased libido Has difficulty maintaining an erection - Viagra works (but gets a HA and stuffy nose) + trauma to testes (see above), no testicular irradiation or surgery No h/o of mumps orchitis/h/o autoimmune ds. No h/o cryptorchidism He grew and went through puberty like his peers No shrinking of testes. No very small testes (<5 ml) No incomplete/delayed sexual development     + breast discomfort/gynecomastia >> resolved    No loss of body hair (axillary/pubic)/decreased need for shaving No height loss but had osteopenia in R hip. No abnormal sense of smell. No hot flushes No vision problems No worst HA of his life No FH of hypogonadism/infertility No personal h/o infertility, but he does not have children by choice No FH of hemochromatosis   No chronic diseases No chronic pain. Not on opiates, does not take steroids.  Can have more than 2 drinks a day of alcohol at a time - some weeks 2-4 drinks a day No anabolic steroids use No herbal medicines On antidepressants: Bupropion and Celexa >> now on Effexor and Buspar.  Reviewed testosterone levels available + other pertinent labs: Component     Latest Ref Rng 07/01/2013 07/08/2014 01/07/2015  Testosterone     300 - 890 ng/dL 337 243 (L) 581  Sex Horm Binding Glob, Serum     10 - 50 nmol/L 28 24 23   Testosterone  Free     47.0 - 244.0 pg/mL 73.6 55.8 150.6  Testosterone-% Free     1.6 - 2.9 % 2.2 2.3 2.6  Estradiol, Free     ADULTS: < OR = 0.45 pg/mL 0.51 (H) 0.65 (H)   Estradiol     ADULTS: < OR = 29 pg/mL 24 32 (H)   Results received      07/09/13    TSH     0.350 - 4.500 uIU/mL     LH     1.50 - 9.30 mIU/mL 3.38 2.93   FSH     1.4 - 18.1 mIU/ML 5.3 5.2   Somatomedin (IGF-I)     73 - 330 ng/mL 113    HCG, Beta Chain, Quant, S      0.17     12/31/2013: Total Testosterone 138 [phone]),  free Testosterone 7.3 (8.7-25.1) 12/25/2013: Total Testosterone 166 [phone]), free Testosterone 6.7 (8.7-25.1)  He had a second opinion with another endocrinologist in 2015 >> he had labs there, testosterone close to normal (while on Clomid) >> he was again recommended to lose weight and was also not recommended testosterone replacement.   TG 400 >> on Lopid.  His HbA1c was higher (still in the prediabetic range). He is on CPAP. Started Protonix.   ROS: per HPI + Constitutional: + weight gain, no fatigue, no subjective hyperthermia/hypothermia Eyes: no blurry vision, no xerophthalmia ENT: no sore throat, no nodules palpated in throat, no dysphagia/odynophagia, no hoarseness Cardiovascular: no CP/SOB/palpitations/no leg swelling Respiratory: no cough/SOB Gastrointestinal: no N/V/D/C, no heartburn Musculoskeletal: no muscle/joint aches Skin: no rashes  Neurological: no tremors/numbness/tingling/dizziness  I reviewed pt's medications, allergies, PMH, social hx, family hx, and changes were documented in the history of present illness. Otherwise, unchanged from my initial visit note. He decreased HCTZ to 12.5 mg. Stopped Qsymia.   PE: BP 128/82 mmHg  Pulse 93  Temp(Src) 98 F (36.7 C) (Oral)  Resp 12  Wt 268 lb (121.564 kg)  SpO2 97% Body mass index is 36.34 kg/(m^2).  Wt Readings from Last 3 Encounters:  01/09/16 268 lb (121.564 kg)  01/07/15 257 lb (116.574 kg)  07/08/14 249 lb 12.8 oz (113.309 kg)   Constitutional: obese, in NAD Eyes: PERRLA, EOMI, no exophthalmos ENT: moist mucous membranes, no thyromegaly, no cervical lymphadenopathy Cardiovascular: RRR, No MRG Respiratory: CTA B Gastrointestinal: abdomen soft, NT, ND, BS+ Musculoskeletal: no deformities, strength intact in all 4 Skin: moist, warm, no rashes Neurological: no tremor with outstretched hands, DTR 3/4 in all  4  ASSESSMENT: 1. Low testosterone - normal free T  Other labs/investigations: - He had  severe acne outbreaks >> 2x 2h urine for cortisol in 2008 and 2012 was normal - reportedly. - OGTT 2 years ago >> normal. HbA1c from 2012: 5.5%. - prolactin: 08/27/2011: 11.6 02/08/2012: 11 06/02/2013: 14.7 - TSH: 09/27/2009: 1.850 10/02/2010: 1.300 07/02/2011: 2.799 06/27/2012: 0.670 - CMP: 03/11/2013: Normal except glucose 104 - lipids: 008/01/2013: 229/343/46/114 - vitamin D: 07/02/2011: 47 - Vitamin B12: 09/27/2009: 616  2. ED  3. High estradiol - testicular U/S 07/22/2013: Negative. No evidence for testicular mass or other significant abnormality in the solitary right testicle. The left testicular prosthesis is unremarkable in appearance.  4. Gynecomastia L>R - fibrocystic changes bilaterally per mammogram and U/S - gynecomastia palpable ~3 cm bilaterally  PLAN:  1. Low testosterone - pt's testosterone level is low, likely 2/2 obesity and not a pituitary adenoma (since he had 2 pituitary MRIs that were normal - 2008 and  2011) and negative pituitary w/u's.  He has (and had) several factors that can contribute to his hypogonadism: - will recheck his testosterone today - he is fasting - continue Clomiphene 25 mg daily as he is feeling much better on it. If we need to increase the dose, we decided to alternate 25 and 50 mcg every other day  2. Erectile dysfunction - continue PDE5 inhibitor  4. High estradiol - on Clomiphene  - see above - normal testicular U/S in the past  4. Gynecomastia L>R - fibrocystic changes bilaterally per mammogram and U/S - Clomiphene is a selective hypothalamic and pituitary estrogen R blocker >> this also really helped his GM >> no more breast pain    Office Visit on 01/09/2016  Component Date Value Ref Range Status  . Testosterone 01/09/2016 548  250 - 827 ng/dL Final  . Albumin 01/09/2016 4.4  3.6 - 5.1 g/dL Final  . Sex Hormone Binding 01/09/2016 28  10 - 50 nmol/L Final  . Testosterone, Free 01/09/2016 86.7  47.0 - 244.0 pg/mL Final    Comment:   The concentration of free testosterone is derived from a mathematical expression based on constants for the binding of testosterone to sex hormone-binding globulin and albumin.   . Testosterone, Bioavailable 01/09/2016 174.6  130.5 - 681.7 ng/dL Final   Comment:   The concentration of bioavailable testosterone is derived from a mathematical expression based on constants for the binding of testosterone to sex hormone-binding globulin and albumin.   Bioavailable testosterone includes free plus weakly bound (non-SHBG bound) testosterone.  Bioavailable testosterone is an assessment of the biologically active testosterone in serum.      Message sent:  Dear Gwyndolyn Saxon, Your testosterone returned normal. It is lower in the normal range compared to a year ago, most likely due to weight gain and more stress.  I do not feel that we necessarily need to increase the clomiphene, but work on diet and exercise for now. Sincerely, Philemon Kingdom MD

## 2016-01-10 LAB — TESTOSTERONE TOTAL,FREE,BIO, MALES
Albumin: 4.4 g/dL (ref 3.6–5.1)
SEX HORMONE BINDING: 28 nmol/L (ref 10–50)
TESTOSTERONE FREE: 86.7 pg/mL (ref 47.0–244.0)
Testosterone, Bioavailable: 174.6 ng/dL (ref 130.5–681.7)
Testosterone: 548 ng/dL (ref 250–827)

## 2016-03-23 ENCOUNTER — Other Ambulatory Visit: Payer: Self-pay | Admitting: Internal Medicine

## 2016-06-01 ENCOUNTER — Other Ambulatory Visit: Payer: Self-pay | Admitting: Internal Medicine

## 2016-07-12 ENCOUNTER — Other Ambulatory Visit: Payer: Self-pay | Admitting: Internal Medicine

## 2016-08-10 ENCOUNTER — Other Ambulatory Visit: Payer: Self-pay | Admitting: Internal Medicine

## 2016-09-05 ENCOUNTER — Other Ambulatory Visit: Payer: Self-pay | Admitting: Internal Medicine

## 2016-10-05 ENCOUNTER — Other Ambulatory Visit: Payer: Self-pay | Admitting: Internal Medicine

## 2016-10-07 DIAGNOSIS — R7301 Impaired fasting glucose: Secondary | ICD-10-CM | POA: Insufficient documentation

## 2016-11-01 ENCOUNTER — Other Ambulatory Visit: Payer: Self-pay | Admitting: Internal Medicine

## 2016-11-08 DIAGNOSIS — R748 Abnormal levels of other serum enzymes: Secondary | ICD-10-CM | POA: Insufficient documentation

## 2016-11-29 ENCOUNTER — Other Ambulatory Visit: Payer: Self-pay | Admitting: Internal Medicine

## 2016-12-26 ENCOUNTER — Other Ambulatory Visit: Payer: Self-pay | Admitting: Internal Medicine

## 2017-01-09 ENCOUNTER — Ambulatory Visit (INDEPENDENT_AMBULATORY_CARE_PROVIDER_SITE_OTHER): Payer: BLUE CROSS/BLUE SHIELD | Admitting: Internal Medicine

## 2017-01-09 VITALS — BP 140/84 | HR 78 | Wt 266.0 lb

## 2017-01-09 DIAGNOSIS — E280 Estrogen excess: Secondary | ICD-10-CM

## 2017-01-09 DIAGNOSIS — E291 Testicular hypofunction: Secondary | ICD-10-CM | POA: Diagnosis not present

## 2017-01-09 DIAGNOSIS — Z6836 Body mass index (BMI) 36.0-36.9, adult: Secondary | ICD-10-CM

## 2017-01-09 DIAGNOSIS — E669 Obesity, unspecified: Secondary | ICD-10-CM | POA: Diagnosis not present

## 2017-01-09 DIAGNOSIS — N529 Male erectile dysfunction, unspecified: Secondary | ICD-10-CM | POA: Diagnosis not present

## 2017-01-09 DIAGNOSIS — N62 Hypertrophy of breast: Secondary | ICD-10-CM | POA: Diagnosis not present

## 2017-01-09 MED ORDER — CLOMIPHENE CITRATE 50 MG PO TABS: 25.0000 mg | ORAL_TABLET | Freq: Every day | ORAL | 11 refills | Status: DC

## 2017-01-09 NOTE — Progress Notes (Signed)
Patient ID: Tyler Gross, male   DOB: 02-Jan-1976, 41 y.o.   MRN: [id number]  HPI: Tyler Gross is a 41 y.o.-year-old man, returning for f/u for low testosterone and gynecomastia. Last visit one year ago.  I saw the patient initially 3.5 years ago. At that time, he had low total testosterone, but normal free testosterone. After this, he had repeat checks in both total and free testosterone were low. We started him on clomiphene 25 mg daily. He initially had blurry vision and vertigo however, these resolved after stopping his antidepressants. He is now doing well on 25 g of Clomid daily.  On Clomid, his repeat testosterone levels were much improved, including the level obtained at last visit, which was normal. At that time, we discussed about 2 options: To continue Clomid or continue to work on his diet and exercise to improve his testosterone levels naturally. He opted to stay on Clomid.   He had normal pituitary workup in the past and to normal pituitary MRIs. He did have a slightly elevated estrogen. He also had gynecomastia ~3 cminitially painful, then pain resolved, especially after starting Clomid.am and breast U/S show only fb-cystic changes.  Since last visit, his HbA1c returned 5.8% (10/2016), ALT normalized, Creatinine is a little high, TG better (309).  Reviewed hx: He started to have Left breast pain fall/2014, but he has had enlargement for longer time. No nipple discharge or retraction. Breast ultrasound and mammogram: fibrocystic densities (read as gynecomastia) - report not available to review.   He was dx with hypogonadism in 2008: - 2008: Total testosterone 321, had pbs with erections then >> referred to urology : low T, LH also low >> saw endocrinology : PRL high >> had MRI pituitary >> no pituitary tumor. - 2011: seen at [facility] endocrinology >> PRL still high >> new pituitary MRI >> no tumor. Testosterone still slightly low >> advised to just watch it - 2014:  testosterone 200s, referred again to endocrinology   He had the L testicle resected in 1991 after trauma during swimming >> subsequent torsion. He has had R testicle U/S in 2001 >> no masses or torsion.  He admits for decreased libido Has difficulty maintaining an erection - Viagra works (but gets a HA and stuffy nose) + trauma to testes (see above), no testicular irradiation or surgery No h/o of mumps orchitis/h/o autoimmune ds. No h/o cryptorchidism He grew and went through puberty like his peers No shrinking of testes. No very small testes (<5 ml) No incomplete/delayed sexual development     + breast discomfort/gynecomastia >> resolved    No loss of body hair (axillary/pubic)/decreased need for shaving No height loss but had osteopenia in R hip. No abnormal sense of smell. No hot flushes No vision problems No worst HA of his life No FH of hypogonadism/infertility No personal h/o infertility, but he does not have children by choice No FH of hemochromatosis   No chronic diseases No chronic pain. Not on opiates, does not take steroids.  Can have more than 2 drinks a day of alcohol at a time - some weeks 2-4 drinks a day No anabolic steroids use No herbal medicines On antidepressants: Bupropion and Celexa >> now on Wellbutrin Buspar.  Reviewed testosterone levels available + other pertinent labs: Component     Latest Ref Rng & Units 01/09/2016  Testosterone     250 - 827 ng/dL 548  Albumin     3.6 - 5.1 g/dL 4.4  Sex Horm Binding  Glob, Serum     10 - 50 nmol/L 28  Testosterone Free     47.0 - 244.0 pg/mL 86.7  Testosterone, Bioavailable     130.5 - 681.7 ng/dL 174.6   Previously: Component     Latest Ref Rng 07/01/2013 07/08/2014 01/07/2015  Testosterone     300 - 890 ng/dL 337 243 (L) 581  Sex Horm Binding Glob, Serum     10 - 50 nmol/L 28 24 23   Testosterone Free     47.0 - 244.0 pg/mL 73.6 55.8 150.6  Testosterone-% Free     1.6 - 2.9 % 2.2 2.3 2.6  Estradiol,  Free     ADULTS: < OR = 0.45 pg/mL 0.51 (H) 0.65 (H)   Estradiol     ADULTS: < OR = 29 pg/mL 24 32 (H)   Results received      07/09/13    TSH     0.350 - 4.500 uIU/mL     LH     1.50 - 9.30 mIU/mL 3.38 2.93   FSH     1.4 - 18.1 mIU/ML 5.3 5.2   Somatomedin (IGF-I)     73 - 330 ng/mL 113    HCG, Beta Chain, Quant, S      0.17     12/31/2013: Total Testosterone 138 [phone]), free Testosterone 7.3 (8.7-25.1) 12/25/2013: Total Testosterone 166 [phone]), free Testosterone 6.7 (8.7-25.1)  He had a second opinion with another endocrinologist in 2015 >> he had labs there, testosterone close to normal (while on Clomid) >> he was again recommended to lose weight and was also not recommended testosterone replacement.   He has HTG >> on Lopid.  His HbA1c: Lab Results  Component Value Date   HGBA1C 5.8 07/01/2013  He is on CPAP.  ROS: per HPI + Constitutional: + weight gain, no fatigue, no subjective hyperthermia, no subjective hypothermia Eyes: no blurry vision, no xerophthalmia ENT: no sore throat, no nodules palpated in throat, no dysphagia, no odynophagia, no hoarseness Cardiovascular: no CP/no SOB/no palpitations/no leg swelling Respiratory: no cough/no SOB/no wheezing Gastrointestinal: no N/no V/+ D/no C/+ acid reflux Musculoskeletal: no muscle aches/+ joint aches - back Skin: no rashes, no hair loss Neurological: no tremors/no numbness/no tingling/no dizziness  I reviewed pt's medications, allergies, PMH, social hx, family hx, and changes were documented in the history of present illness. Otherwise, unchanged from my initial visit note. Started Protonix.  PE: BP 140/84 (BP Location: Left Arm, Patient Position: Sitting)   Pulse 78   Wt 266 lb (120.7 kg)   SpO2 98%   BMI 36.08 kg/m  Body mass index is 36.08 kg/m.  Wt Readings from Last 3 Encounters:  01/09/17 266 lb (120.7 kg)  01/09/16 268 lb (121.6 kg)  01/07/15 257 lb (116.6 kg)   Constitutional: obese, in  NAD Eyes: PERRLA, EOMI, no exophthalmos ENT: moist mucous membranes, no thyromegaly, no cervical lymphadenopathy Cardiovascular: RRR, No MRG Respiratory: CTA B Gastrointestinal: abdomen soft, NT, ND, BS+ Musculoskeletal: no deformities, strength intact in all 4 Skin: moist, warm, no rashes Neurological: no tremor with outstretched hands, DTR 3/4 in all  4  ASSESSMENT: 1. Low testosterone - normal free T  Other labs/investigations: - He had severe acne outbreaks >> 2x 2h urine for cortisol in 2008 and 2012 was normal - reportedly. - OGTT 2 years ago >> normal. HbA1c from 2012: 5.5%. - prolactin: 08/27/2011: 11.6 02/08/2012: 11 06/02/2013: 14.7 - TSH: 09/27/2009: 1.850 10/02/2010: 1.300 07/02/2011: 2.799 06/27/2012: 0.670 - CMP: 03/11/2013:  Normal except glucose 104 - lipids: 008/01/2013: 229/343/46/114 - vitamin D: 07/02/2011: 47 - Vitamin B12: 09/27/2009: 616  2. ED  3. High estradiol - testicular U/S 07/22/2013: Negative. No evidence for testicular mass or other significant abnormality in the solitary right testicle. The left testicular prosthesis is unremarkable in appearance.  4. Gynecomastia L>R - fibrocystic changes bilaterally per mammogram and U/S - gynecomastia palpable ~3 cm bilaterally  5. Obesity  PLAN:  1. Low testosterone - Most likely secondary to obesity and not a pituitary adenoma (he had 2 pituitary MRIs that were normal in 2008 and 2011 and also had negative pituitary workup's in the past - He did well on clomiphene 25 mg daily which he continues today. He feels much better on this. At last visit, his testosterone level was normal - We'll recheck his testosterone today as he is fasting - We'll continue Clomiphene 25 mg daily. If we need to increase the dose, we decided to alternate 25 and 50 mcg every other day  2. Erectile dysfunction - Continue PDE 5 inhibitor  4. High estradiol - On clomiphene-see above - He had normal testicular ultrasound  in the past - Will recheck level today   4. Gynecomastia L>R - He had fibrocystic changes bilaterally per mammogram and ultrasound  - no significant breast discomfort after starting Clomid since this is a selective hypothalamic and pituitary estrogen receptor blocker  5. Obesity - discussed about diet >> I suggested he sees Dr. Dennard Nip in the [facility] - also discussed about exercise  - time spent with the patient: 40 min, of which >50% was spent in obtaining information about his symptoms, reviewing his previous labs, evaluations, and treatments, counseling him about his conditions (please see the discussed topics above), and developing a plan to further investigate them; he had a number of questions which I addressed.  Office Visit on 01/09/2017  Component Date Value Ref Range Status  . Testosterone 01/09/2017 524  264 - 916 ng/dL Final   Comment: Adult male reference interval is based on a population of healthy nonobese males (BMI <30) between 50 and 21 years old. [place], [place] [phone]. PMID: [id number].   Marland Kitchen Testosterone, Free 01/09/2017 13.8  6.8 - 21.5 pg/mL Final  . Sex Hormone Binding 01/09/2017 35.5  16.5 - 55.9 nmol/L Final   Testosterone level normal.  Philemon Kingdom, MD PhD [facility]

## 2017-01-09 NOTE — Patient Instructions (Signed)
Please check out: [facility] - Finding Ultra.  Please stop at the lab.  Continue Clomid 25 mg daily.  Please return in 1 year.

## 2017-01-11 LAB — TESTOSTERONE, FREE, TOTAL, SHBG
Sex Hormone Binding: 35.5 nmol/L (ref 16.5–55.9)
TESTOSTERONE: 524 ng/dL (ref 264–916)
Testosterone, Free: 13.8 pg/mL (ref 6.8–21.5)

## 2017-01-16 ENCOUNTER — Encounter: Payer: Self-pay | Admitting: Internal Medicine

## 2017-01-16 LAB — ESTRADIOL, FREE
ESTRADIOL FREE: 1.89 pg/mL — AB (ref <=0.45)
Estradiol: 93 pg/mL — ABNORMAL HIGH (ref <=29)

## 2017-01-17 ENCOUNTER — Other Ambulatory Visit: Payer: Self-pay

## 2017-01-17 ENCOUNTER — Telehealth: Payer: Self-pay

## 2017-01-17 ENCOUNTER — Other Ambulatory Visit: Payer: Self-pay | Admitting: Internal Medicine

## 2017-01-17 ENCOUNTER — Telehealth: Payer: Self-pay | Admitting: Internal Medicine

## 2017-01-17 DIAGNOSIS — N50819 Testicular pain, unspecified: Secondary | ICD-10-CM

## 2017-01-17 NOTE — Telephone Encounter (Signed)
I spoke with [facility], they are needing the order to have the doppler added to the order. Please add and I will notify patient so he can get scheduled. Thank you!

## 2017-01-17 NOTE — Telephone Encounter (Signed)
Called and notified patient. No other questions.

## 2017-01-17 NOTE — Telephone Encounter (Signed)
MD found correct order, and placed it.

## 2017-01-17 NOTE — Telephone Encounter (Signed)
Called patient and advised of where Dr.Gherghe had placed the order. Patient will call, also had submitted a e-mail regarding how much medication to take. I will send over to MD.

## 2017-01-17 NOTE — Telephone Encounter (Signed)
Patient called in tro advise that imaging told him proper protocol wasn't established with the u/s order. They are telling him that a doppler is needed.   Conferred with Megan B and was advised to put in a phone note. Patient has expressed extreme discomfort and is asking that someone call him @ his cell # today.

## 2017-01-17 NOTE — Telephone Encounter (Signed)
See message.

## 2017-01-17 NOTE — Telephone Encounter (Signed)
Please have they place the right order - I am not sure which one they want - and I will sign.

## 2017-01-17 NOTE — Telephone Encounter (Signed)
Patient states that Dr. Renne Crigler is sending him for an ultrasound and he would like to call and schedule it. Please let patient know where he should call to schedule it.

## 2017-01-18 ENCOUNTER — Ambulatory Visit
Admission: RE | Admit: 2017-01-18 | Discharge: 2017-01-18 | Disposition: A | Discharge status: Home / Self Care | Payer: BLUE CROSS/BLUE SHIELD | Source: Ambulatory Visit | Attending: Internal Medicine | Admitting: Internal Medicine

## 2017-01-18 ENCOUNTER — Encounter: Payer: Self-pay | Admitting: Internal Medicine

## 2017-01-18 DIAGNOSIS — N50819 Testicular pain, unspecified: Secondary | ICD-10-CM

## 2017-01-18 NOTE — Telephone Encounter (Signed)
Patient notified

## 2017-01-18 NOTE — Telephone Encounter (Signed)
Patient called wanting to know if the [place] report from today could be reviewed before we close this evening. Thanks!

## 2017-01-18 NOTE — Telephone Encounter (Signed)
Yes, I will send it to him now.

## 2017-01-24 ENCOUNTER — Other Ambulatory Visit: Payer: BLUE CROSS/BLUE SHIELD

## 2017-01-25 ENCOUNTER — Other Ambulatory Visit: Payer: Self-pay | Admitting: Internal Medicine

## 2017-02-24 ENCOUNTER — Other Ambulatory Visit: Payer: Self-pay | Admitting: Internal Medicine

## 2017-03-01 ENCOUNTER — Encounter: Payer: Self-pay | Admitting: Internal Medicine

## 2017-03-28 ENCOUNTER — Other Ambulatory Visit: Payer: Self-pay | Admitting: Internal Medicine

## 2017-04-25 ENCOUNTER — Other Ambulatory Visit: Payer: Self-pay | Admitting: Internal Medicine

## 2017-07-23 IMAGING — US US SCROTUM
1 series · 14 of 25 positions shown · non-contrast
Comparison: None.

CLINICAL DATA: Right scrotal swelling x1 week. Low testosterone.
History of left testicular prosthetic due to traumatic injury in
5335.

EXAM:
SCROTAL ULTRASOUND
DOPPLER ULTRASOUND OF THE TESTICLES
TECHNIQUE: Complete ultrasound examination of the testicles, epididymis, and
other scrotal structures was performed. Color and spectral Doppler
ultrasound were also utilized to evaluate blood flow to the
testicles.

[Series 1: us scrotum · 14 of 32 slices shown]
[im 1/32]
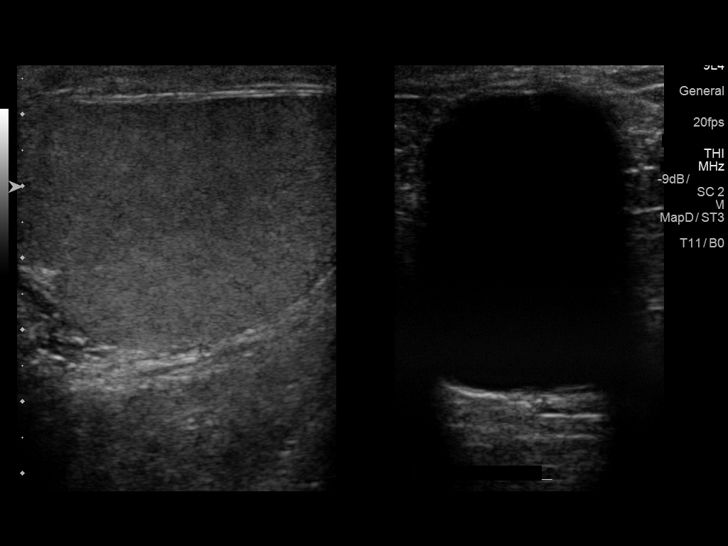
[im 3/32]
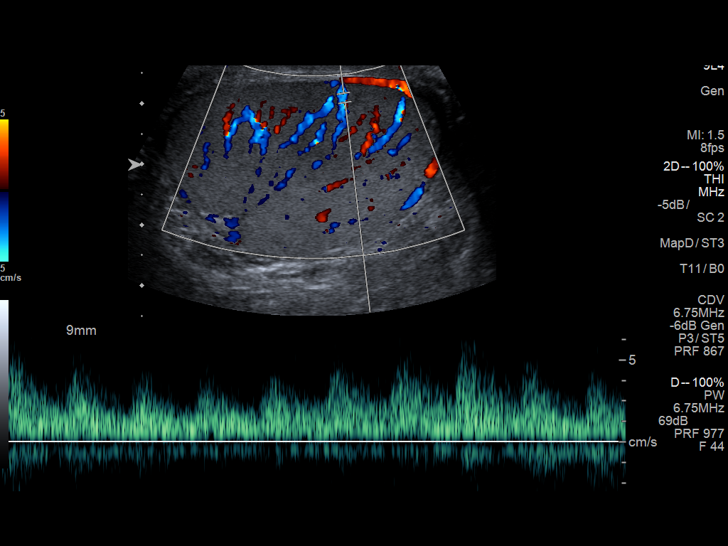
[im 6/32]
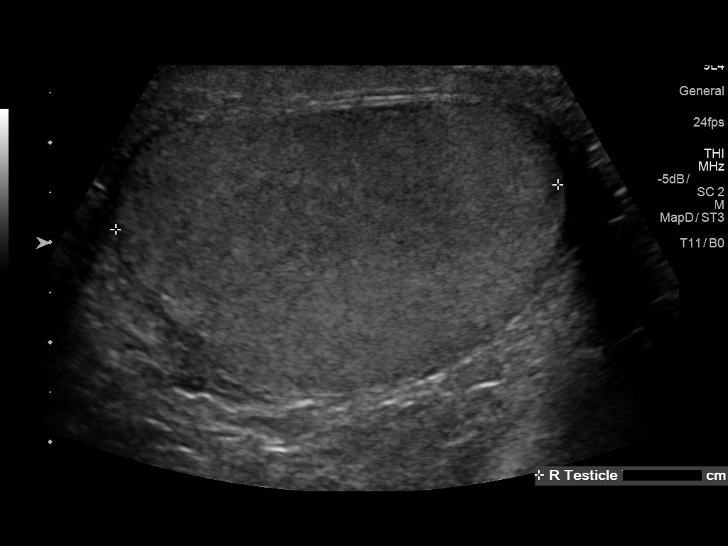
[im 8/32]
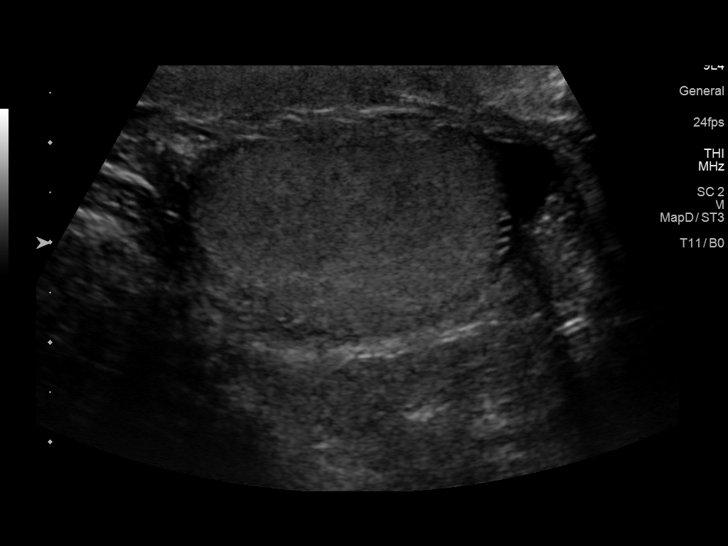
[im 11/32]
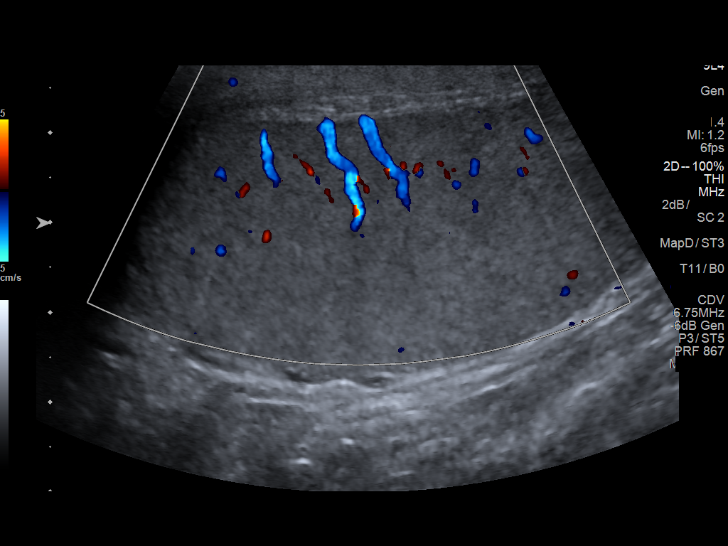
[im 12/32]
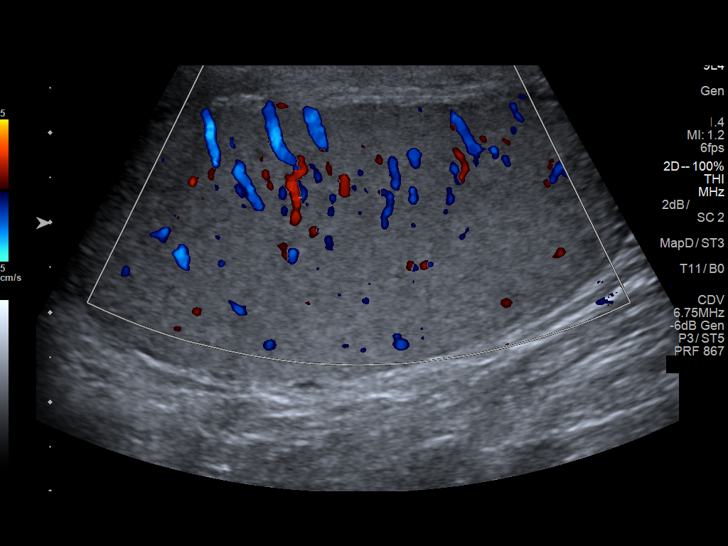
[im 15/32]
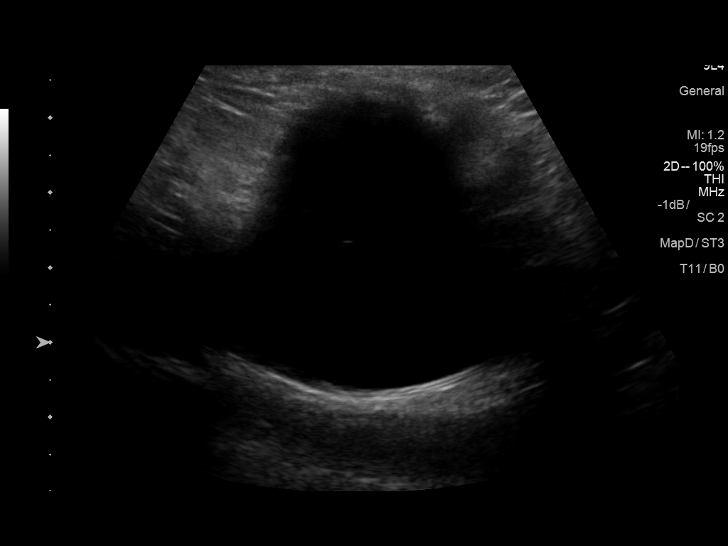
[im 17/32]
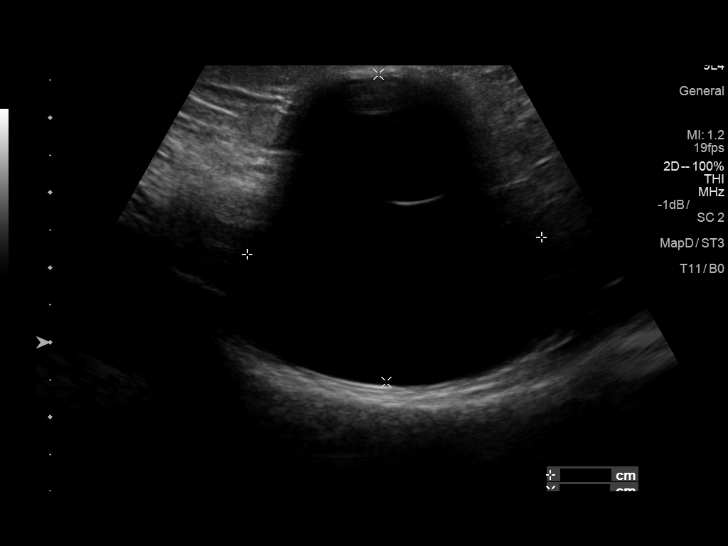
[im 20/32]
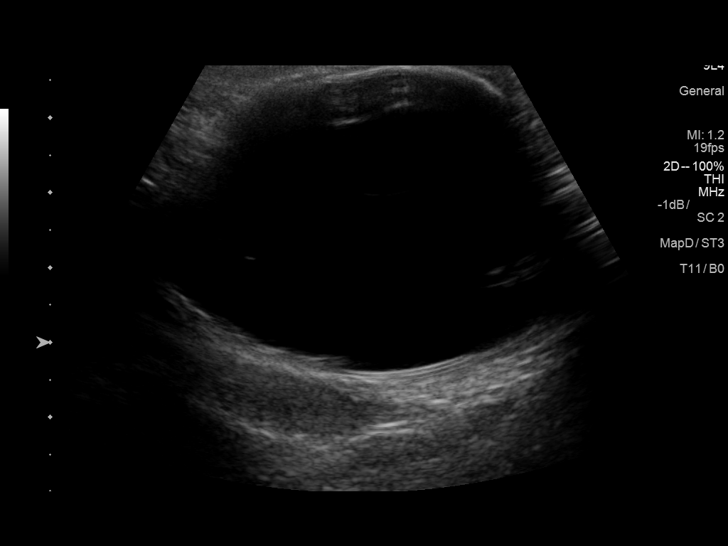
[im 21/32]
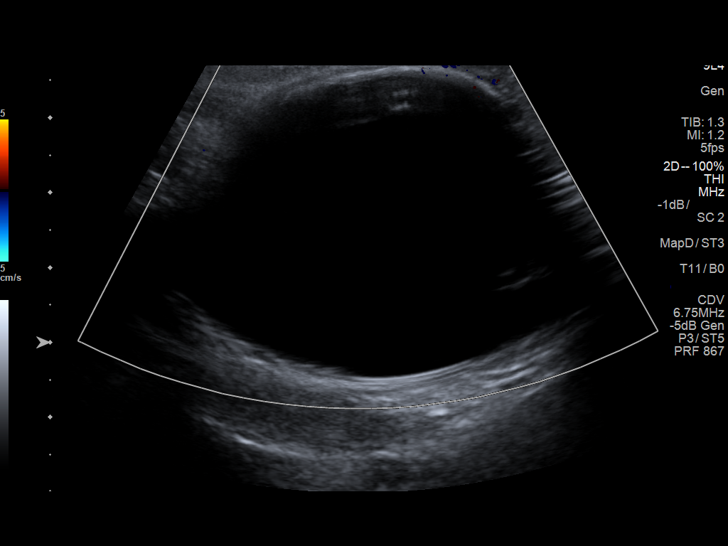
[im 24/32]
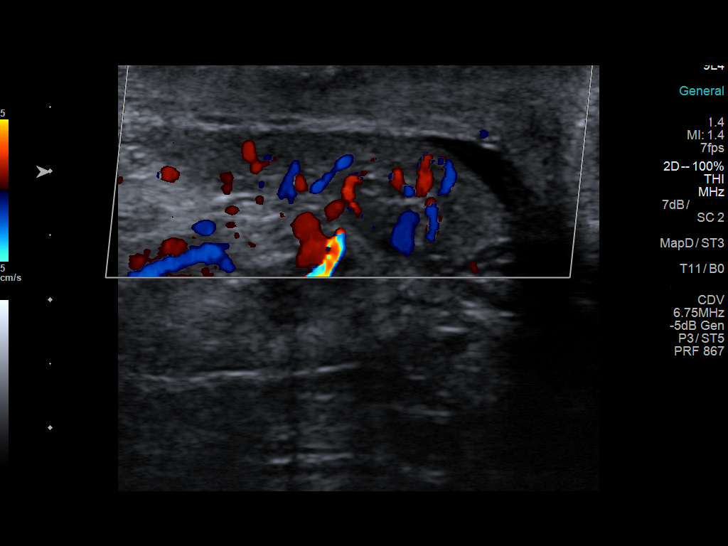
[im 26/32]
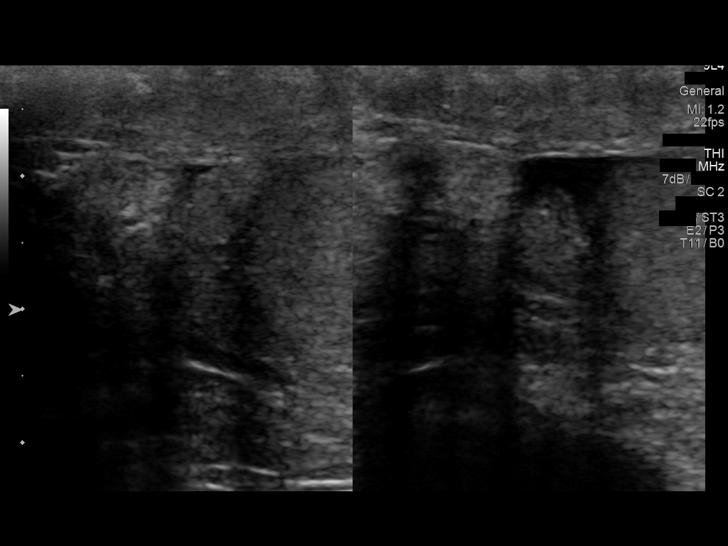
[im 29/32]
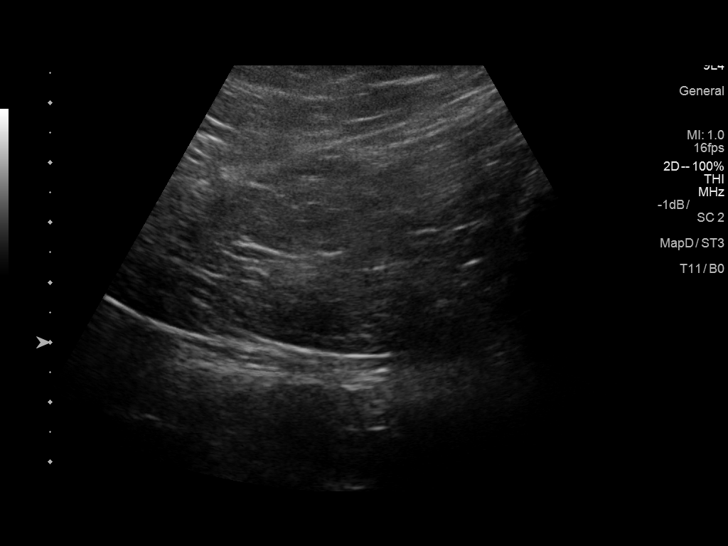
[im 32/32]
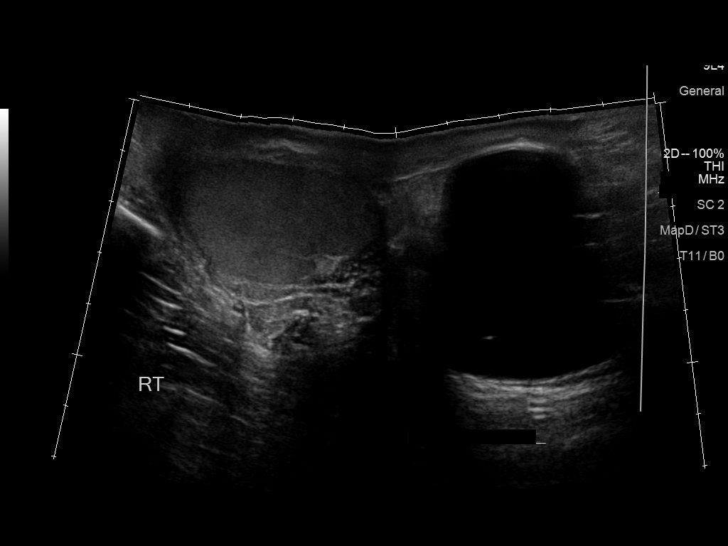

[14 of 25 positions shown; findings below may reference images not displayed]

FINDINGS: Right testicle

Measurements: 6.2 x 2.9 x 4.5 cm. No mass or microlithiasis
visualized.

Left testicle

Measurements: Testicular prosthesis noted measuring 3.9 x 4.1 x
cm.

Right epididymis:  Normal in size and appearance.

Left epididymis:  Not visualized and may be surgically absent.

Hydrocele:  None visualized.

Varicocele:  None visualized.

Pulsed Doppler interrogation of both testes demonstrates normal low
resistance arterial and venous waveforms of the remain right
testicle.
IMPRESSION: 1. Status post left orchiectomy with testicular prosthetic in place.
2. Unremarkable appearance of the remaining right testicle and
epididymis. No evidence of torsion or mass.

## 2017-12-20 ENCOUNTER — Ambulatory Visit: Payer: Commercial Managed Care - PPO | Admitting: Family Medicine

## 2017-12-20 ENCOUNTER — Encounter: Payer: Self-pay | Admitting: Family Medicine

## 2017-12-20 VITALS — BP 124/80 | HR 77 | Ht 72.0 in | Wt 256.6 lb

## 2017-12-20 DIAGNOSIS — L98491 Non-pressure chronic ulcer of skin of other sites limited to breakdown of skin: Secondary | ICD-10-CM | POA: Insufficient documentation

## 2017-12-20 DIAGNOSIS — E291 Testicular hypofunction: Secondary | ICD-10-CM | POA: Diagnosis not present

## 2017-12-20 DIAGNOSIS — I10 Essential (primary) hypertension: Secondary | ICD-10-CM

## 2017-12-20 DIAGNOSIS — E781 Pure hyperglyceridemia: Secondary | ICD-10-CM

## 2017-12-20 DIAGNOSIS — F411 Generalized anxiety disorder: Secondary | ICD-10-CM | POA: Insufficient documentation

## 2017-12-20 MED ORDER — MUPIROCIN 2 % EX OINT: 1.0000 "application " | TOPICAL_OINTMENT | Freq: Two times a day (BID) | CUTANEOUS | 0 refills | Status: DC

## 2017-12-20 NOTE — Assessment & Plan Note (Signed)
Tolerating lopid Fish oil recently increased Recommend working on reduction of dietary fat and high carb foods Incorporate regular exercise.

## 2017-12-20 NOTE — Assessment & Plan Note (Signed)
Stable at this time Plans to continue to follow with urology for management of clomiphene and anastrazole.

## 2017-12-20 NOTE — Progress Notes (Signed)
Tyler Gross - 42 y.o. male MRN [id number]  Date of birth: 1976/02/12  Subjective Chief Complaint  Patient presents with  . Hypertension  . Anxiety    HPI Tyler Gross is a 42 y.o. male former patient of mine with a history of HTN, hypertriglyceridemia, generalized anxiety disorder, OSA and hypogonadism and excess estrogen here today for re-establishment of care. He has concern about bleeding from the rectal area as well.   -HTN: Current treatment with atenolol, amlodipine, and lisinopril-hctz.  He is doing well with these medications and BP has been well controlled.  He has not had side effects.  He does admit that he could make improvements to diet an activity.  He denies chest pain, shortness of breath, palpitations, headache, vision changes.   -Anxiety:  Long history of anxiety with current treatment of wellbutrin and buspar.  Buspar prescribed tid but only taking BID as he often forgets third dose.  Had a flare of anxiety a few months ago after starting a new job.  Wellbutrin increased to 300mg  however he was unable to tolerate this increase.  He is now back down to 150mg  and reports he is doing well on this at this time.  He is settling in to new job well.  -Hypogonadism:  History of orchiectomy and low total testosterone and normal free testosterone levels.  Started on clomiphene by endocrinology with improvement of levels and symptoms.  Seen by urology as well and found to have elevated estrogen levels.  He was then started on anastrazole as well.  He reports he is doing well with this and feels that this has improved his emotional lability.  Denies side effects including hot flashes or vision changes.   -Hypertriglyceridemia:  Treated with gemfibrozil and recommended addition of fish oil recently, which he is doing.  Last TG in 10/2017 were 327.  Denies side effects from current medications.  He plans to work on some dietary changes as well.   -Buttock bleeding:  Bleeding noted  when wiping after bowel movement.  Does not seem to be coming from anal/rectal area but just above.  This has been ongoing for about a month now and started after he sat down awkardly on the arm of chair.  Denies drainage from area, fever, chills.    ROS: ROS completed and negative except as noted per HPI Allergies  Allergen Reactions  . Atorvastatin Other (See Comments)    Muscle fatigue  . Bee Venom Swelling  . Sulfamethoxazole-Trimethoprim Hives    Bactrim     Past Medical History:  Diagnosis Date  . Anxiety   . Hyperlipemia   . Hypertension   . Hypertriglyceridemia   . Hypogonadism in male   . Sleep apnea     Past Surgical History:  Procedure Laterality Date  . CYSTOSCOPY    . TESTICLE REMOVAL      Social History   Socioeconomic History  . Marital status: Single    Spouse name: Not on file  . Number of children: Not on file  . Years of education: Not on file  . Highest education level: Not on file  Occupational History  . Not on file  Social Needs  . Financial resource strain: Not on file  . Food insecurity:    Worry: Not on file    Inability: Not on file  . Transportation needs:    Medical: Not on file    Non-medical: Not on file  Tobacco Use  . Smoking status: Former  Smoker  . Smokeless tobacco: Never Used  Substance and Sexual Activity  . Alcohol use: Yes    Alcohol/week: 1.5 oz    Types: 3 Standard drinks or equivalent per week    Comment: socially   . Drug use: No  . Sexual activity: Yes    Partners: Male  Lifestyle  . Physical activity:    Days per week: Not on file    Minutes per session: Not on file  . Stress: Not on file  Relationships  . Social connections:    Talks on phone: Not on file    Gets together: Not on file    Attends religious service: Not on file    Active member of club or organization: Not on file    Attends meetings of clubs or organizations: Not on file    Relationship status: Not on file  Other Topics Concern  . Not  on file  Social History Narrative   Lives with partner   Regular exercise: no   Caffeine use: 1 large cup of coffee daily; 2 to 3 sodas in the evening    Family History  Problem Relation Age of Onset  . Cancer Mother        breast cancer: stage I  . Hypertension Mother     Health Maintenance  Topic Date Due  . Samul Dada  11/16/1993  . HIV Screening  12/21/2018 (Originally 11/16/1989)  . INFLUENZA VACCINE  03/06/2018    ----------------------------------------------------------------------------------------------------------------------------------------------------------------------------------------------------------------- Physical Exam BP 124/80 (BP Location: Right Arm, Patient Position: Sitting, Cuff Size: Normal)   Pulse 77   Ht 6' (1.829 m)   Wt 256 lb 9.6 oz (116.4 kg)   BMI 34.80 kg/m   Physical Exam  Constitutional: He is oriented to person, place, and time. He appears well-nourished. No distress.  HENT:  Mouth/Throat: Oropharynx is clear and moist.  Eyes: No scleral icterus.  Neck: Neck supple. No thyromegaly present.  Cardiovascular: Normal rate, regular rhythm and normal heart sounds.  Pulmonary/Chest: Effort normal and breath sounds normal.  Musculoskeletal: He exhibits no edema.  Lymphadenopathy:    He has no cervical adenopathy.  Neurological: He is alert and oriented to person, place, and time.  Skin: No rash noted.  Ulceration noted to inside right buttock superior to anus. No active bleeding or drainage noted.    Psychiatric: He has a normal mood and affect. His behavior is normal.    ------------------------------------------------------------------------------------------------------------------------------------------------------------------------------------------------------------------- Assessment and Plan  GAD (generalized anxiety disorder) Well controlled at this time with current dose of wellbutrin and buspar, recommend continuation.     Had mentioned to previous pcp about desire to see counselor previously, he will let me know if he decides to pursue this.   Hypertriglyceridemia Tolerating lopid Fish oil recently increased Recommend working on reduction of dietary fat and high carb foods Incorporate regular exercise.    Essential hypertension BP is well controlled Continue current medications Recent labs from [facility] reviewed through care everywhere.   Hypogonadism male Stable at this time Plans to continue to follow with urology for management of clomiphene and anastrazole.   Skin ulceration, limited to breakdown of skin (HCC) Ulceration noted in inside of right gluteus.  No anal/rectal involvement noted Rx for mupirocin Advised using wet wipes to limit irritation.

## 2017-12-20 NOTE — Assessment & Plan Note (Signed)
Well controlled at this time with current dose of wellbutrin and buspar, recommend continuation.   Had mentioned to previous pcp about desire to see counselor previously, he will let me know if he decides to pursue this.

## 2017-12-20 NOTE — Assessment & Plan Note (Addendum)
BP is well controlled Continue current medications Recent labs from [facility] reviewed through care everywhere.

## 2017-12-20 NOTE — Assessment & Plan Note (Signed)
Ulceration noted in inside of right gluteus.  No anal/rectal involvement noted Rx for mupirocin Advised using wet wipes to limit irritation.

## 2017-12-20 NOTE — Patient Instructions (Signed)
It was great to see you again! Continue current medications Start mupirocin to area on buttock.  Use wet wipes to wipe with.  Let me know if not clearing up. I will see you back in about 6 months or sooner if needed.

## 2018-01-03 ENCOUNTER — Other Ambulatory Visit: Payer: Self-pay | Admitting: Emergency Medicine

## 2018-01-03 ENCOUNTER — Telehealth: Payer: Self-pay | Admitting: Family Medicine

## 2018-01-03 ENCOUNTER — Ambulatory Visit: Payer: Self-pay | Admitting: *Deleted

## 2018-01-03 MED ORDER — GEMFIBROZIL 600 MG PO TABS: 600.0000 mg | ORAL_TABLET | Freq: Every day | ORAL | 0 refills | Status: DC

## 2018-01-03 MED ORDER — AMLODIPINE BESYLATE 5 MG PO TABS: 5.0000 mg | ORAL_TABLET | Freq: Every day | ORAL | 0 refills | Status: DC

## 2018-01-03 MED ORDER — PANTOPRAZOLE SODIUM 40 MG PO TBEC: 40.0000 mg | DELAYED_RELEASE_TABLET | Freq: Every day | ORAL | 0 refills | Status: DC

## 2018-01-03 MED ORDER — LISINOPRIL-HYDROCHLOROTHIAZIDE 20-12.5 MG PO TABS: 1.0000 | ORAL_TABLET | Freq: Every day | ORAL | 0 refills | Status: DC

## 2018-01-03 MED ORDER — BUPROPION HCL ER (XL) 150 MG PO TB24: 150.0000 mg | ORAL_TABLET | Freq: Every day | ORAL | 0 refills | Status: DC

## 2018-01-03 MED ORDER — ATENOLOL 100 MG PO TABS: 100.0000 mg | ORAL_TABLET | Freq: Every day | ORAL | 0 refills | Status: DC

## 2018-01-03 MED ORDER — BUSPIRONE HCL 10 MG PO TABS: 10.0000 mg | ORAL_TABLET | Freq: Every day | ORAL | 0 refills | Status: DC

## 2018-01-03 MED ORDER — POTASSIUM CHLORIDE CRYS ER 10 MEQ PO TBCR: 10.0000 meq | EXTENDED_RELEASE_TABLET | Freq: Every day | ORAL | 0 refills | Status: DC

## 2018-01-03 NOTE — Telephone Encounter (Signed)
Copied from [place] [id number]. Topic: Quick Communication - See Telephone Encounter >> Jan 03, 2018  9:37 AM Keene Breath wrote: CRM for notification. See Telephone encounter for: 01/03/18.  Patient called and stated that he was out of town and left all of his medicines at home.  Patient requests that he only needs 6 days worth of refills and will pay out of pocket if need be.  Patient just established with Dr. Zigmund Daniel and he told patient to call if he need the medications.  He would like those sent to [facility], [address], [place], [place],  [place].  lisinopril-hydrochlorothiazide (PRINZIDE,ZESTORETIC) 20-12.5 MG tablet, atenolol (TENORMIN) 100 MG tablet [[id number]]  DISCONTINUED amLODipine (NORVASC) 5 MG tablet,busPIRone (BUSPAR) 10 MG tablet, buPROPion (WELLBUTRIN XL) 150 MG 24 hr tablet,gemfibrozil (LOPID) 600 MG tablet,   He can be reached at Telephone:  [phone].

## 2018-01-03 NOTE — Telephone Encounter (Signed)
Pt calling back about all his medicines that he left at home

## 2018-01-03 NOTE — Telephone Encounter (Signed)
Patient feels like a flutter- he has been on medication for years and can tell he has not taken his medication.  Patient states he will be fine once he takes his pills. Advised that if his symptoms get worse - he needs to be seen. Patient states he left his am medications at home- he needs 6 days of the following pills sent in. Norvasc 5 mg, Atenolol 100 mg, Lisinopril- hydrochlorothiazide 20-12.5 mg, buspirone 10 mg, bupropion XL 150 mg, gemfibrozil 600 mg, K-dur 10 meq, pantoprazole 40 mg. Patient is becoming anxious because he has missed his morning dosing. [facility] [address] [place] [address] [phone] Patient did not want to be triaged for flutter.  Reason for Disposition . [1] Request for URGENT new prescription or refill of "essential" medication (i.e., likelihood of harm to patient if not taken) AND [2] triager unable to fill per unit policy  Answer Assessment - Initial Assessment Questions 1. SYMPTOMS: "Do you have any symptoms?"     Flutter due to missed BP medications 2. SEVERITY: If symptoms are present, ask "Are they mild, moderate or severe?"     mild  Protocols used: MEDICATION QUESTION CALL-A-AH

## 2018-01-03 NOTE — Telephone Encounter (Signed)
Spoke with patient regarding medication being sent to pharmacy that was requested. Nothing further needed.

## 2018-01-03 NOTE — Telephone Encounter (Signed)
Pt states he did not get the  Atenolol 100 mg tablet, and that is the one he really needs. Pt would like 6 tabs sent to this pharmacy

## 2018-01-03 NOTE — Telephone Encounter (Signed)
Medication has been sent to patient preferred pharmacy

## 2018-01-09 ENCOUNTER — Ambulatory Visit: Payer: BLUE CROSS/BLUE SHIELD | Admitting: Internal Medicine

## 2018-01-24 ENCOUNTER — Encounter: Payer: Self-pay | Admitting: Family Medicine

## 2018-01-24 ENCOUNTER — Ambulatory Visit: Payer: Commercial Managed Care - PPO | Admitting: Family Medicine

## 2018-01-24 VITALS — BP 138/90 | HR 79 | Temp 98.5°F | Ht 72.0 in | Wt 263.8 lb

## 2018-01-24 DIAGNOSIS — F329 Major depressive disorder, single episode, unspecified: Secondary | ICD-10-CM | POA: Insufficient documentation

## 2018-01-24 DIAGNOSIS — E539 Vitamin B deficiency, unspecified: Secondary | ICD-10-CM | POA: Insufficient documentation

## 2018-01-24 DIAGNOSIS — L98491 Non-pressure chronic ulcer of skin of other sites limited to breakdown of skin: Secondary | ICD-10-CM

## 2018-01-24 DIAGNOSIS — F32A Depression, unspecified: Secondary | ICD-10-CM | POA: Insufficient documentation

## 2018-01-24 MED ORDER — MUPIROCIN 2 % EX OINT: 1.0000 "application " | TOPICAL_OINTMENT | Freq: Two times a day (BID) | CUTANEOUS | 0 refills | Status: DC

## 2018-01-24 NOTE — Patient Instructions (Signed)
Continue mupirocin ointment I have placed a referral to colorectal surgery as well.

## 2018-01-24 NOTE — Progress Notes (Signed)
Tyler Gross - 43 y.o. male MRN [id number]  Date of birth: 10/07/1975  Subjective Chief Complaint  Patient presents with  . Anal Fissure    here a month ago and it went away but on Wed its started to come back.    HPI Tyler Gross is a 42 y.o. male here today for follow up of rectal bleeding.  Seen about a month ago for bleeding around anal area, mainly with wiping.  Ulceration noted on exam and given rx for mupirocin ointment.  Reports that area cleared up but has now returned.  He is using wet wipes but still has bleeding.  He does not have significant pain  With this.  He denies changes to bowels but has always had issues with his GI system.  Report that at one point he was told he may have Crohn's but was ruled out with colonoscopy.  He denies fever, chills, or purulent drainage from the rectal area.    ROS:  ROS completed and negative except as noted per HPI  Allergies  Allergen Reactions  . Atorvastatin Other (See Comments)    Muscle fatigue  . Bee Venom Swelling  . Sulfamethoxazole-Trimethoprim Hives    Bactrim     Past Medical History:  Diagnosis Date  . Anxiety   . Hyperlipemia   . Hypertension   . Hypertriglyceridemia   . Hypogonadism in male   . Sleep apnea     Past Surgical History:  Procedure Laterality Date  . CYSTOSCOPY    . TESTICLE REMOVAL      Social History   Socioeconomic History  . Marital status: Single    Spouse name: Not on file  . Number of children: Not on file  . Years of education: Not on file  . Highest education level: Not on file  Occupational History  . Not on file  Social Needs  . Financial resource strain: Not on file  . Food insecurity:    Worry: Not on file    Inability: Not on file  . Transportation needs:    Medical: Not on file    Non-medical: Not on file  Tobacco Use  . Smoking status: Former Research scientist (life sciences)  . Smokeless tobacco: Never Used  Substance and Sexual Activity  . Alcohol use: Yes    Alcohol/week: 1.8 oz      Types: 3 Standard drinks or equivalent per week    Comment: socially   . Drug use: No  . Sexual activity: Yes    Partners: Male  Lifestyle  . Physical activity:    Days per week: Not on file    Minutes per session: Not on file  . Stress: Not on file  Relationships  . Social connections:    Talks on phone: Not on file    Gets together: Not on file    Attends religious service: Not on file    Active member of club or organization: Not on file    Attends meetings of clubs or organizations: Not on file    Relationship status: Not on file  Other Topics Concern  . Not on file  Social History Narrative   Lives with partner   Regular exercise: no   Caffeine use: 1 large cup of coffee daily; 2 to 3 sodas in the evening    Family History  Problem Relation Age of Onset  . Cancer Mother        breast cancer: stage I  . Hypertension Mother  Health Maintenance  Topic Date Due  . TETANUS/TDAP  11/16/1993  . HIV Screening  12/21/2018 (Originally 11/16/1989)  . INFLUENZA VACCINE  03/06/2018    ----------------------------------------------------------------------------------------------------------------------------------------------------------------------------------------------------------------- Physical Exam BP 138/90 (BP Location: Left Arm, Patient Position: Sitting, Cuff Size: Normal)   Pulse 79   Temp 98.5 F (36.9 C) (Oral)   Ht 6' (1.829 m)   Wt 263 lb 12.8 oz (119.7 kg)   SpO2 96%   BMI 35.78 kg/m   Physical Exam  Constitutional: He is oriented to person, place, and time. He appears well-nourished. No distress.  Neurological: He is alert and oriented to person, place, and time.  Skin:  Continues to have small ulceration noted to inside right buttock superior to anus. No active bleeding or drainage noted.     Psychiatric: He has a normal mood and affect. His behavior is normal.     ------------------------------------------------------------------------------------------------------------------------------------------------------------------------------------------------------------------- Assessment and Plan  Skin ulceration, limited to breakdown of skin ([place]) Continued ulceration, improved with mupirocin but returned after stopping Will renew mupirocin ?possible sinus tract/fistula, he is also concerned about potential anal cancer. Will refer to colorectal surgery for eval as well.

## 2018-01-24 NOTE — Assessment & Plan Note (Signed)
Continued ulceration, improved with mupirocin but returned after stopping Will renew mupirocin ?possible sinus tract/fistula, he is also concerned about potential anal cancer. Will refer to colorectal surgery for eval as well.

## 2018-02-04 ENCOUNTER — Ambulatory Visit: Payer: Self-pay | Admitting: General Surgery

## 2018-02-04 NOTE — H&P (View-Only) (Signed)
History of Present Illness Tyler Ruff MD; 02/11/5884 9:51 AM) The patient is a 42 year old male who presents with anal lesions. 42 year old male who presents to the office with complaints of an anal ulceration. He noticed this after a fall which caused some tailbone pain. After that he developed some swelling and now he has an ulceration that will not heal. He notices bleeding when he wipes. He was given an antibacterial ointment which did seem to help but the ulcer returned after stopping this. It has been present for approximately 2-3 months and has not resolved. He reports chronic loose stools and has been worked up in the past for Crohn's disease .with small bowel follow-through and colonoscopy. He was also treated briefly with Remicade He states that he was told that he did not have Crohn's disease and that he had microscopic colitis but doesn't remember getting treated for this. He has never had any external lesions. He has had a penile lesion which was thought to be herpes but was never proven with biopsy.   Past Surgical History (Tanisha A. Owens Shark, [place]; 02/04/2018 9:30 AM) Oral Surgery  Diagnostic Studies History (Tanisha A. Owens Shark, [place]; 02/04/2018 9:30 AM) Colonoscopy >10 years ago  Allergies (Tanisha A. Owens Shark, [place]; 02/04/2018 9:33 AM) Sulfa Antibiotics Lipitor *ANTIHYPERLIPIDEMICS* BEE VENOM Allergies Reconciled  Medication History (Tanisha A. Owens Shark, [place]; 02/04/2018 9:31 AM) AmLODIPine Besylate (5MG  Tablet, Oral) Active. Anastrozole (1MG  Tablet, Oral) Active. Atenolol (100MG  Tablet, Oral) Active. BuPROPion HCl ER (XL) (150MG  Tablet ER 24HR, Oral) Active. Gemfibrozil (600MG  Tablet, Oral) Active. Klor-Con M10 (10MEQ Tablet ER, Oral) Active. Lisinopril-Hydrochlorothiazide (20-12.5MG  Tablet, Oral) Active. Pantoprazole Sodium (40MG  Tablet DR, Oral) Active. Medications Reconciled  Social History (Tanisha A. Owens Shark, RMA; 02/04/2018 9:30 AM) Alcohol use Moderate  alcohol use. Caffeine use Carbonated beverages. No drug use Tobacco use Former smoker.  Family History (Tanisha A. Owens Shark, [place]; 02/04/2018 9:30 AM) Alcohol Abuse Mother. Breast Cancer Mother. Depression Mother. Hypertension Brother.  Other Problems (Tanisha A. Brown, [place]; 02/04/2018 9:30 AM) Anxiety Disorder Depression Gastroesophageal Reflux Disease Hemorrhoids High blood pressure Hypercholesterolemia Kidney Stone Sleep Apnea     Review of Systems (Tanisha A. Brown RMA; 02/04/2018 9:30 AM) General Present- Weight Gain. Not Present- Appetite Loss, Chills, Fatigue, Fever, Night Sweats and Weight Loss. Skin Present- New Lesions. Not Present- Change in Wart/Mole, Dryness, Hives, Jaundice, Non-Healing Wounds, Rash and Ulcer. HEENT Present- Wears glasses/contact lenses. Not Present- Earache, Hearing Loss, Hoarseness, Nose Bleed, Oral Ulcers, Ringing in the Ears, Seasonal Allergies, Sinus Pain, Sore Throat, Visual Disturbances and Yellow Eyes. Respiratory Present- Snoring. Not Present- Bloody sputum, Chronic Cough, Difficulty Breathing and Wheezing. Breast Not Present- Breast Mass, Breast Pain, Nipple Discharge and Skin Changes. Cardiovascular Not Present- Chest Pain, Difficulty Breathing Lying Down, Leg Cramps, Palpitations, Rapid Heart Rate, Shortness of Breath and Swelling of Extremities. Gastrointestinal Present- Rectal Pain. Not Present- Abdominal Pain, Bloating, Bloody Stool, Change in Bowel Habits, Chronic diarrhea, Constipation, Difficulty Swallowing, Excessive gas, Gets full quickly at meals, Hemorrhoids, Indigestion, Nausea and Vomiting. Male Genitourinary Not Present- Blood in Urine, Change in Urinary Stream, Frequency, Impotence, Nocturia, Painful Urination, Urgency and Urine Leakage. Musculoskeletal Not Present- Back Pain, Joint Pain, Joint Stiffness, Muscle Pain, Muscle Weakness and Swelling of Extremities. Neurological Not Present- Decreased Memory, Fainting,  Headaches, Numbness, Seizures, Tingling, Tremor, Trouble walking and Weakness. Psychiatric Present- Anxiety and Depression. Not Present- Bipolar, Change in Sleep Pattern, Fearful and Frequent crying.  Vitals (Tanisha A. Brown RMA; 02/04/2018 9:31 AM) 02/04/2018 9:30 AM Weight: 266.8  lb Height: 72in Body Surface Area: 2.41 m Body Mass Index: 36.18 kg/m  Temp.: 98.8F  Pulse: 82 (Regular)  BP: 132/84 (Sitting, Left Arm, Standard)      Physical Exam Tyler Ruff MD; 10/08/1936 9:54 AM)  General Mental Status-Alert. General Appearance-Not in acute distress. Build & Nutrition-Well nourished. Posture-Normal posture. Gait-Normal.  Head and Neck Head-normocephalic, atraumatic with no lesions or palpable masses. Trachea-midline.  Chest and Lung Exam Chest and lung exam reveals -on auscultation, normal breath sounds, no adventitious sounds and normal vocal resonance.  Cardiovascular Cardiovascular examination reveals -normal heart sounds, regular rate and rhythm with no murmurs and no digital clubbing, cyanosis, edema, increased warmth or tenderness.  Abdomen Inspection Inspection of the abdomen reveals - No Hernias. Palpation/Percussion Palpation and Percussion of the abdomen reveal - Soft, Non Tender, No Rigidity (guarding), No hepatosplenomegaly and No Palpable abdominal masses.  Rectal Anorectal Exam External - Note: Left posterior ulceration, fistula probe inserts underneath the skin, palpable cord present. Internal - Note: Posterior midline internal lesion, possible internal opening.  Neurologic Neurologic evaluation reveals -alert and oriented x 3 with no impairment of recent or remote memory, normal attention span and ability to concentrate, normal sensation and normal coordination.  Musculoskeletal Normal Exam - Bilateral-Upper Extremity Strength Normal and Lower Extremity Strength Normal.    Assessment & Plan Tyler Ruff MD;  9/0/2409 9:55 AM)  ANAL LESION (K62.9) Impression: 42 year old male who presents to the office with a 2-3 month history of an external perianal lesions that will not heal. 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 I recommended exam under anesthesia with possible fistulotomy versus seton placement. We have discussed this in detail. If the fistula is not found we will try to find the etiology of his chronic wound.

## 2018-02-04 NOTE — H&P (Signed)
History of Present Illness Tyler Ruff MD; 0/0/5110 9:51 AM) The patient is a 42 year old male who presents with anal lesions. 42 year old male who presents to the office with complaints of an anal ulceration. He noticed this after a fall which caused some tailbone pain. After that he developed some swelling and now he has an ulceration that will not heal. He notices bleeding when he wipes. He was given an antibacterial ointment which did seem to help but the ulcer returned after stopping this. It has been present for approximately 2-3 months and has not resolved. He reports chronic loose stools and has been worked up in the past for Crohn's disease .with small bowel follow-through and colonoscopy. He was also treated briefly with Remicade He states that he was told that he did not have Crohn's disease and that he had microscopic colitis but doesn't remember getting treated for this. He has never had any external lesions. He has had a penile lesion which was thought to be herpes but was never proven with biopsy.   Past Surgical History (Tanisha A. Owens Shark, [place]; 02/04/2018 9:30 AM) Oral Surgery  Diagnostic Studies History (Tanisha A. Owens Shark, [place]; 02/04/2018 9:30 AM) Colonoscopy >10 years ago  Allergies (Tanisha A. Owens Shark, [place]; 02/04/2018 9:33 AM) Sulfa Antibiotics Lipitor *ANTIHYPERLIPIDEMICS* BEE VENOM Allergies Reconciled  Medication History (Tanisha A. Owens Shark, [place]; 02/04/2018 9:31 AM) AmLODIPine Besylate (5MG  Tablet, Oral) Active. Anastrozole (1MG  Tablet, Oral) Active. Atenolol (100MG  Tablet, Oral) Active. BuPROPion HCl ER (XL) (150MG  Tablet ER 24HR, Oral) Active. Gemfibrozil (600MG  Tablet, Oral) Active. Klor-Con M10 (10MEQ Tablet ER, Oral) Active. Lisinopril-Hydrochlorothiazide (20-12.5MG  Tablet, Oral) Active. Pantoprazole Sodium (40MG  Tablet DR, Oral) Active. Medications Reconciled  Social History (Tanisha A. Owens Shark, RMA; 02/04/2018 9:30 AM) Alcohol use Moderate  alcohol use. Caffeine use Carbonated beverages. No drug use Tobacco use Former smoker.  Family History (Tanisha A. Owens Shark, [place]; 02/04/2018 9:30 AM) Alcohol Abuse Mother. Breast Cancer Mother. Depression Mother. Hypertension Brother.  Other Problems (Tanisha A. Brown, [place]; 02/04/2018 9:30 AM) Anxiety Disorder Depression Gastroesophageal Reflux Disease Hemorrhoids High blood pressure Hypercholesterolemia Kidney Stone Sleep Apnea     Review of Systems (Tanisha A. Brown RMA; 02/04/2018 9:30 AM) General Present- Weight Gain. Not Present- Appetite Loss, Chills, Fatigue, Fever, Night Sweats and Weight Loss. Skin Present- New Lesions. Not Present- Change in Wart/Mole, Dryness, Hives, Jaundice, Non-Healing Wounds, Rash and Ulcer. HEENT Present- Wears glasses/contact lenses. Not Present- Earache, Hearing Loss, Hoarseness, Nose Bleed, Oral Ulcers, Ringing in the Ears, Seasonal Allergies, Sinus Pain, Sore Throat, Visual Disturbances and Yellow Eyes. Respiratory Present- Snoring. Not Present- Bloody sputum, Chronic Cough, Difficulty Breathing and Wheezing. Breast Not Present- Breast Mass, Breast Pain, Nipple Discharge and Skin Changes. Cardiovascular Not Present- Chest Pain, Difficulty Breathing Lying Down, Leg Cramps, Palpitations, Rapid Heart Rate, Shortness of Breath and Swelling of Extremities. Gastrointestinal Present- Rectal Pain. Not Present- Abdominal Pain, Bloating, Bloody Stool, Change in Bowel Habits, Chronic diarrhea, Constipation, Difficulty Swallowing, Excessive gas, Gets full quickly at meals, Hemorrhoids, Indigestion, Nausea and Vomiting. Male Genitourinary Not Present- Blood in Urine, Change in Urinary Stream, Frequency, Impotence, Nocturia, Painful Urination, Urgency and Urine Leakage. Musculoskeletal Not Present- Back Pain, Joint Pain, Joint Stiffness, Muscle Pain, Muscle Weakness and Swelling of Extremities. Neurological Not Present- Decreased Memory, Fainting,  Headaches, Numbness, Seizures, Tingling, Tremor, Trouble walking and Weakness. Psychiatric Present- Anxiety and Depression. Not Present- Bipolar, Change in Sleep Pattern, Fearful and Frequent crying.  Vitals (Tanisha A. Brown RMA; 02/04/2018 9:31 AM) 02/04/2018 9:30 AM Weight: 266.8  lb Height: 72in Body Surface Area: 2.41 m Body Mass Index: 36.18 kg/m  Temp.: 98.58F  Pulse: 82 (Regular)  BP: 132/84 (Sitting, Left Arm, Standard)      Physical Exam Tyler Ruff MD; 04/11/931 9:54 AM)  General Mental Status-Alert. General Appearance-Not in acute distress. Build & Nutrition-Well nourished. Posture-Normal posture. Gait-Normal.  Head and Neck Head-normocephalic, atraumatic with no lesions or palpable masses. Trachea-midline.  Chest and Lung Exam Chest and lung exam reveals -on auscultation, normal breath sounds, no adventitious sounds and normal vocal resonance.  Cardiovascular Cardiovascular examination reveals -normal heart sounds, regular rate and rhythm with no murmurs and no digital clubbing, cyanosis, edema, increased warmth or tenderness.  Abdomen Inspection Inspection of the abdomen reveals - No Hernias. Palpation/Percussion Palpation and Percussion of the abdomen reveal - Soft, Non Tender, No Rigidity (guarding), No hepatosplenomegaly and No Palpable abdominal masses.  Rectal Anorectal Exam External - Note: Left posterior ulceration, fistula probe inserts underneath the skin, palpable cord present. Internal - Note: Posterior midline internal lesion, possible internal opening.  Neurologic Neurologic evaluation reveals -alert and oriented x 3 with no impairment of recent or remote memory, normal attention span and ability to concentrate, normal sensation and normal coordination.  Musculoskeletal Normal Exam - Bilateral-Upper Extremity Strength Normal and Lower Extremity Strength Normal.    Assessment & Plan Tyler Ruff MD;  01/11/1244 9:55 AM)  ANAL LESION (K62.9) Impression: 42 year old male who presents to the office with a 2-3 month history of an external perianal lesions that will not heal. 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 I recommended exam under anesthesia with possible fistulotomy versus seton placement. We have discussed this in detail. If the fistula is not found we will try to find the etiology of his chronic wound.

## 2018-02-07 ENCOUNTER — Encounter (HOSPITAL_BASED_OUTPATIENT_CLINIC_OR_DEPARTMENT_OTHER): Payer: Self-pay | Admitting: *Deleted

## 2018-02-07 ENCOUNTER — Other Ambulatory Visit: Payer: Self-pay

## 2018-02-07 NOTE — Progress Notes (Addendum)
Spoke w/ pt via phone for pre-op interview.  Npo after mn.  Arrive at [facility].  Need istat 8.  Requested current ekg to be fax from pt pcp, lauren young pa ([facility] in [place]).  Will take am meds dos w/ sips of water with exception no asa, vitamins, lisinopril-hct, or k-dur (pt verbalized understanding).  To bring cpap mask and tubing.

## 2018-02-13 ENCOUNTER — Encounter (HOSPITAL_BASED_OUTPATIENT_CLINIC_OR_DEPARTMENT_OTHER): Payer: Self-pay

## 2018-02-13 ENCOUNTER — Ambulatory Visit (HOSPITAL_BASED_OUTPATIENT_CLINIC_OR_DEPARTMENT_OTHER): Payer: Commercial Managed Care - PPO | Admitting: Anesthesiology

## 2018-02-13 ENCOUNTER — Ambulatory Visit (HOSPITAL_BASED_OUTPATIENT_CLINIC_OR_DEPARTMENT_OTHER)
Admission: RE | Admit: 2018-02-13 | Discharge: 2018-02-13 | Disposition: A | Discharge status: Home / Self Care | Payer: Commercial Managed Care - PPO | Source: Ambulatory Visit | Attending: General Surgery | Admitting: General Surgery

## 2018-02-13 ENCOUNTER — Other Ambulatory Visit: Payer: Self-pay

## 2018-02-13 ENCOUNTER — Encounter (HOSPITAL_BASED_OUTPATIENT_CLINIC_OR_DEPARTMENT_OTHER)
Admission: RE | Disposition: A | Discharge status: Home / Self Care | Payer: Self-pay | Source: Ambulatory Visit | Attending: General Surgery

## 2018-02-13 DIAGNOSIS — K640 First degree hemorrhoids: Secondary | ICD-10-CM | POA: Diagnosis not present

## 2018-02-13 DIAGNOSIS — A630 Anogenital (venereal) warts: Secondary | ICD-10-CM | POA: Diagnosis not present

## 2018-02-13 DIAGNOSIS — F329 Major depressive disorder, single episode, unspecified: Secondary | ICD-10-CM | POA: Diagnosis not present

## 2018-02-13 DIAGNOSIS — Z79899 Other long term (current) drug therapy: Secondary | ICD-10-CM | POA: Diagnosis not present

## 2018-02-13 DIAGNOSIS — G4730 Sleep apnea, unspecified: Secondary | ICD-10-CM | POA: Diagnosis not present

## 2018-02-13 DIAGNOSIS — Z87891 Personal history of nicotine dependence: Secondary | ICD-10-CM | POA: Insufficient documentation

## 2018-02-13 DIAGNOSIS — I10 Essential (primary) hypertension: Secondary | ICD-10-CM | POA: Diagnosis not present

## 2018-02-13 DIAGNOSIS — K6282 Dysplasia of anus: Secondary | ICD-10-CM | POA: Insufficient documentation

## 2018-02-13 DIAGNOSIS — K219 Gastro-esophageal reflux disease without esophagitis: Secondary | ICD-10-CM | POA: Insufficient documentation

## 2018-02-13 DIAGNOSIS — E7800 Pure hypercholesterolemia, unspecified: Secondary | ICD-10-CM | POA: Insufficient documentation

## 2018-02-13 DIAGNOSIS — F419 Anxiety disorder, unspecified: Secondary | ICD-10-CM | POA: Diagnosis not present

## 2018-02-13 DIAGNOSIS — K629 Disease of anus and rectum, unspecified: Secondary | ICD-10-CM | POA: Diagnosis present

## 2018-02-13 HISTORY — DX: Prediabetes: R73.03

## 2018-02-13 HISTORY — DX: Major depressive disorder, single episode, unspecified: F32.9

## 2018-02-13 HISTORY — DX: Dependence on other enabling machines and devices: Z99.89

## 2018-02-13 HISTORY — DX: Male erectile dysfunction, unspecified: N52.9

## 2018-02-13 HISTORY — DX: Obstructive sleep apnea (adult) (pediatric): G47.33

## 2018-02-13 HISTORY — DX: Depression, unspecified: F32.A

## 2018-02-13 HISTORY — DX: Mixed hyperlipidemia: E78.2

## 2018-02-13 HISTORY — DX: Testicular hypofunction: E29.1

## 2018-02-13 HISTORY — PX: EVALUATION UNDER ANESTHESIA WITH ANAL FISTULECTOMY: SHX5621

## 2018-02-13 HISTORY — DX: Presence of spectacles and contact lenses: Z97.3

## 2018-02-13 HISTORY — DX: Disease of anus and rectum, unspecified: K62.9

## 2018-02-13 HISTORY — DX: Other specified abnormal findings of blood chemistry: R79.89

## 2018-02-13 HISTORY — DX: Personal history of urinary calculi: Z87.442

## 2018-02-13 HISTORY — DX: Hypertrophy of breast: N62

## 2018-02-13 HISTORY — DX: Generalized anxiety disorder: F41.1

## 2018-02-13 LAB — POCT I-STAT, CHEM 8
BUN: 15 mg/dL (ref 6–20)
CALCIUM ION: 1.22 mmol/L (ref 1.15–1.40)
CREATININE: 0.9 mg/dL (ref 0.61–1.24)
Chloride: 105 mmol/L (ref 98–111)
Glucose, Bld: 127 mg/dL — ABNORMAL HIGH (ref 70–99)
HCT: 45 % (ref 39.0–52.0)
HEMOGLOBIN: 15.3 g/dL (ref 13.0–17.0)
POTASSIUM: 4.4 mmol/L (ref 3.5–5.1)
Sodium: 142 mmol/L (ref 135–145)
TCO2: 22 mmol/L (ref 22–32)

## 2018-02-13 SURGERY — EXAM UNDER ANESTHESIA WITH ANAL FISTULECTOMY
Anesthesia: Monitor Anesthesia Care

## 2018-02-13 MED ORDER — ONDANSETRON HCL 4 MG/2ML IJ SOLN
INTRAMUSCULAR | Status: DC | PRN
  Administered 2018-02-13: 4 mg via INTRAVENOUS

## 2018-02-13 MED ORDER — BUPIVACAINE-EPINEPHRINE 0.5% -1:200000 IJ SOLN
INTRAMUSCULAR | Status: DC | PRN
  Administered 2018-02-13: 30 mL

## 2018-02-13 MED ORDER — ACETAMINOPHEN 500 MG PO TABS
1000.0000 mg | ORAL_TABLET | ORAL | Status: AC
  Administered 2018-02-13: 1000 mg via ORAL
  Filled 2018-02-13: qty 2

## 2018-02-13 MED ORDER — MIDAZOLAM HCL 2 MG/2ML IJ SOLN
INTRAMUSCULAR | Status: AC
  Filled 2018-02-13: qty 2

## 2018-02-13 MED ORDER — ACETAMINOPHEN 325 MG PO TABS
650.0000 mg | ORAL_TABLET | ORAL | Status: DC | PRN
  Filled 2018-02-13: qty 2

## 2018-02-13 MED ORDER — PROPOFOL 500 MG/50ML IV EMUL
INTRAVENOUS | Status: AC
  Filled 2018-02-13: qty 50

## 2018-02-13 MED ORDER — FENTANYL CITRATE (PF) 100 MCG/2ML IJ SOLN
25.0000 ug | INTRAMUSCULAR | Status: DC | PRN
  Administered 2018-02-13 (×2): 12.5 ug via INTRAVENOUS
  Filled 2018-02-13: qty 1

## 2018-02-13 MED ORDER — PROPOFOL 500 MG/50ML IV EMUL
INTRAVENOUS | Status: DC | PRN
  Administered 2018-02-13: 150 ug/kg/min via INTRAVENOUS

## 2018-02-13 MED ORDER — ONDANSETRON HCL 4 MG/2ML IJ SOLN
INTRAMUSCULAR | Status: AC
  Filled 2018-02-13: qty 2

## 2018-02-13 MED ORDER — GABAPENTIN 300 MG PO CAPS
ORAL_CAPSULE | ORAL | Status: AC
  Filled 2018-02-13: qty 1

## 2018-02-13 MED ORDER — GABAPENTIN 300 MG PO CAPS
300.0000 mg | ORAL_CAPSULE | ORAL | Status: AC
  Administered 2018-02-13: 300 mg via ORAL
  Filled 2018-02-13: qty 1

## 2018-02-13 MED ORDER — PROMETHAZINE HCL 25 MG/ML IJ SOLN
6.2500 mg | INTRAMUSCULAR | Status: DC | PRN
  Filled 2018-02-13: qty 1

## 2018-02-13 MED ORDER — FENTANYL CITRATE (PF) 100 MCG/2ML IJ SOLN
INTRAMUSCULAR | Status: AC
  Filled 2018-02-13: qty 2

## 2018-02-13 MED ORDER — ACETAMINOPHEN 650 MG RE SUPP
650.0000 mg | RECTAL | Status: DC | PRN
  Filled 2018-02-13: qty 1

## 2018-02-13 MED ORDER — SODIUM CHLORIDE 0.9% FLUSH
3.0000 mL | Freq: Two times a day (BID) | INTRAVENOUS | Status: DC
  Filled 2018-02-13: qty 3

## 2018-02-13 MED ORDER — ACETAMINOPHEN 500 MG PO TABS
ORAL_TABLET | ORAL | Status: AC
  Filled 2018-02-13: qty 2

## 2018-02-13 MED ORDER — MIDAZOLAM HCL 5 MG/5ML IJ SOLN
INTRAMUSCULAR | Status: DC | PRN
  Administered 2018-02-13: 2 mg via INTRAVENOUS

## 2018-02-13 MED ORDER — LACTATED RINGERS IV SOLN
INTRAVENOUS | Status: DC
  Administered 2018-02-13 (×2): via INTRAVENOUS
  Filled 2018-02-13: qty 1000

## 2018-02-13 MED ORDER — LIDOCAINE HCL (CARDIAC) PF 100 MG/5ML IV SOSY
PREFILLED_SYRINGE | INTRAVENOUS | Status: DC | PRN
  Administered 2018-02-13: 60 mg via INTRAVENOUS

## 2018-02-13 MED ORDER — SODIUM CHLORIDE 0.9 % IV SOLN
250.0000 mL | INTRAVENOUS | Status: DC | PRN
  Filled 2018-02-13: qty 250

## 2018-02-13 MED ORDER — LIDOCAINE 2% (20 MG/ML) 5 ML SYRINGE
INTRAMUSCULAR | Status: AC
  Filled 2018-02-13: qty 5

## 2018-02-13 MED ORDER — SODIUM CHLORIDE 0.9% FLUSH
3.0000 mL | INTRAVENOUS | Status: DC | PRN
  Filled 2018-02-13: qty 3

## 2018-02-13 SURGICAL SUPPLY — 51 items
BENZOIN TINCTURE PRP APPL 2/3 (GAUZE/BANDAGES/DRESSINGS) ×4 IMPLANT
BLADE EXTENDED COATED 6.5IN (ELECTRODE) IMPLANT
BLADE HEX COATED 2.75 (ELECTRODE) ×2 IMPLANT
BLADE SURG 10 STRL SS (BLADE) IMPLANT
BLADE SURG 15 STRL LF DISP TIS (BLADE) IMPLANT
BLADE SURG 15 STRL SS (BLADE)
BRIEF STRETCH FOR OB PAD LRG (UNDERPADS AND DIAPERS) ×4 IMPLANT
CANISTER SUCT 3000ML PPV (MISCELLANEOUS) ×2 IMPLANT
COVER BACK TABLE 60X90IN (DRAPES) ×2 IMPLANT
COVER MAYO STAND STRL (DRAPES) ×2 IMPLANT
DECANTER SPIKE VIAL GLASS SM (MISCELLANEOUS) ×2 IMPLANT
DRAPE LAPAROTOMY 100X72 PEDS (DRAPES) ×2 IMPLANT
DRAPE UTILITY XL STRL (DRAPES) ×2 IMPLANT
DRSG PAD ABDOMINAL 8X10 ST (GAUZE/BANDAGES/DRESSINGS) ×2 IMPLANT
ELECT REM PT RETURN 9FT ADLT (ELECTROSURGICAL) ×2
ELECTRODE REM PT RTRN 9FT ADLT (ELECTROSURGICAL) ×1 IMPLANT
GAUZE SPONGE 4X4 12PLY STRL (GAUZE/BANDAGES/DRESSINGS) ×2 IMPLANT
GAUZE SPONGE 4X4 12PLY STRL LF (GAUZE/BANDAGES/DRESSINGS) IMPLANT
GAUZE SPONGE 4X4 16PLY XRAY LF (GAUZE/BANDAGES/DRESSINGS) IMPLANT
GLOVE BIO SURGEON STRL SZ 6.5 (GLOVE) ×2 IMPLANT
GLOVE BIOGEL PI IND STRL 7.0 (GLOVE) ×1 IMPLANT
GLOVE BIOGEL PI INDICATOR 7.0 (GLOVE) ×1
GLOVE INDICATOR 7.0 STRL GRN (GLOVE) ×2 IMPLANT
GOWN SPEC L3 XXLG W/TWL (GOWN DISPOSABLE) ×2 IMPLANT
GOWN STRL REUS W/TWL 2XL LVL3 (GOWN DISPOSABLE) ×2 IMPLANT
HYDROGEN PEROXIDE 16OZ (MISCELLANEOUS) ×2 IMPLANT
KIT TURNOVER CYSTO (KITS) ×2 IMPLANT
LOOP VESSEL MAXI BLUE (MISCELLANEOUS) ×2 IMPLANT
NEEDLE HYPO 22GX1.5 SAFETY (NEEDLE) ×2 IMPLANT
NS IRRIG 500ML POUR BTL (IV SOLUTION) ×2 IMPLANT
PACK BASIN DAY SURGERY FS (CUSTOM PROCEDURE TRAY) ×2 IMPLANT
PAD ABD 8X10 STRL (GAUZE/BANDAGES/DRESSINGS) ×2 IMPLANT
PAD ARMBOARD 7.5X6 YLW CONV (MISCELLANEOUS) ×2 IMPLANT
PENCIL BUTTON HOLSTER BLD 10FT (ELECTRODE) ×2 IMPLANT
SPONGE HEMORRHOID 8X3CM (HEMOSTASIS) IMPLANT
SPONGE SURGIFOAM ABS GEL 12-7 (HEMOSTASIS) IMPLANT
SUCTION FRAZIER HANDLE 10FR (MISCELLANEOUS)
SUCTION TUBE FRAZIER 10FR DISP (MISCELLANEOUS) IMPLANT
SUT CHROMIC 2 0 SH (SUTURE) IMPLANT
SUT CHROMIC 3 0 SH 27 (SUTURE) IMPLANT
SUT ETHIBOND 0 (SUTURE) ×2 IMPLANT
SUT VIC AB 2-0 SH 27 (SUTURE)
SUT VIC AB 2-0 SH 27XBRD (SUTURE) IMPLANT
SUT VIC AB 3-0 SH 18 (SUTURE) IMPLANT
SUT VIC AB 4-0 P-3 18XBRD (SUTURE) IMPLANT
SUT VIC AB 4-0 P3 18 (SUTURE)
SYR CONTROL 10ML LL (SYRINGE) ×2 IMPLANT
TOWEL OR 17X24 6PK STRL BLUE (TOWEL DISPOSABLE) ×2 IMPLANT
TRAY DSU PREP LF (CUSTOM PROCEDURE TRAY) ×2 IMPLANT
TUBE CONNECTING 12X1/4 (SUCTIONS) ×2 IMPLANT
YANKAUER SUCT BULB TIP NO VENT (SUCTIONS) ×2 IMPLANT

## 2018-02-13 NOTE — Anesthesia Preprocedure Evaluation (Signed)
Anesthesia Evaluation  Patient identified by MRN, date of birth, ID band Patient awake    Reviewed: Allergy & Precautions, NPO status , Patient's Chart, lab work & pertinent test results  Airway Mallampati: II  TM Distance: >3 FB Neck ROM: Full    Dental no notable dental hx.    Pulmonary sleep apnea and Continuous Positive Airway Pressure Ventilation , former smoker,    Pulmonary exam normal breath sounds clear to auscultation       Cardiovascular hypertension, Normal cardiovascular exam Rhythm:Regular Rate:Normal     Neuro/Psych Anxiety negative neurological ROS     GI/Hepatic negative GI ROS, Neg liver ROS,   Endo/Other  negative endocrine ROS  Renal/GU negative Renal ROS  negative genitourinary   Musculoskeletal negative musculoskeletal ROS (+)   Abdominal   Peds negative pediatric ROS (+)  Hematology negative hematology ROS (+)   Anesthesia Other Findings   Reproductive/Obstetrics negative OB ROS                             Anesthesia Physical Anesthesia Plan  ASA: III  Anesthesia Plan: MAC   Post-op Pain Management:    Induction: Intravenous  PONV Risk Score and Plan: 1 and Ondansetron  Airway Management Planned: Simple Face Mask  Additional Equipment:   Intra-op Plan:   Post-operative Plan:   Informed Consent: I have reviewed the patients History and Physical, chart, labs and discussed the procedure including the risks, benefits and alternatives for the proposed anesthesia with the patient or authorized representative who has indicated his/her understanding and acceptance.   Dental advisory given  Plan Discussed with: CRNA and Surgeon  Anesthesia Plan Comments:         Anesthesia Quick Evaluation

## 2018-02-13 NOTE — Anesthesia Procedure Notes (Signed)
Procedure Name: [place] Performed by: Myrtie Soman, MD Pre-anesthesia Checklist: Patient identified, Emergency Drugs available, Suction available, Patient being monitored and Timeout performed Placement Confirmation: positive ETCO2,  CO2 detector and breath sounds checked- equal and bilateral

## 2018-02-13 NOTE — Interval H&P Note (Signed)
History and Physical Interval Note:  02/13/2018 8:29 AM  Tyler Gross  has presented today for surgery, with the diagnosis of nonhealing anal lesion  The various methods of treatment have been discussed with the patient and family. After consideration of risks, benefits and other options for treatment, the patient has consented to  Procedure(s): ANAL EXAM UNDER ANESTHESIA WITH ANAL FISTULOTOMY  POSSIBLE BIOPSY (N/A) POSSIBLE PLACEMENT OF SETON (N/A) as a surgical intervention .  The patient's history has been reviewed, patient examined, no change in status, stable for surgery.  I have reviewed the patient's chart and labs.  Questions were answered to the patient's satisfaction.   Risks include bleeding, recurrence and possible incontinence if fistulotomy is performed.    Rosario Adie, MD  [facility] [facility]

## 2018-02-13 NOTE — Anesthesia Postprocedure Evaluation (Signed)
Anesthesia Post Note  Patient: Tyler Gross  Procedure(s) Performed: ANAL EXAM UNDER ANESTHESIA WITH BIOPSY (N/A )     Patient location during evaluation: PACU Anesthesia Type: MAC Level of consciousness: awake and alert Pain management: pain level controlled Vital Signs Assessment: post-procedure vital signs reviewed and stable Respiratory status: spontaneous breathing, nonlabored ventilation, respiratory function stable and patient connected to nasal cannula oxygen Cardiovascular status: stable and blood pressure returned to baseline Postop Assessment: no apparent nausea or vomiting Anesthetic complications: no    Last Vitals:  Vitals:   02/13/18 1045 02/13/18 1055  BP: (!) 141/81 136/89  Pulse: 63 80  Resp: 17 19  Temp:    SpO2: 92% 94%    Last Pain:  Vitals:   02/13/18 1055  TempSrc:   PainSc: 0-No pain                 Zygmunt Mcglinn S

## 2018-02-13 NOTE — Op Note (Signed)
02/13/2018  10:13 AM  PATIENT:  Tyler Gross  42 y.o. male  Patient Care Team: Luetta Nutting, DO as PCP - General (Family Medicine)  PRE-OPERATIVE DIAGNOSIS:  nonhealing anal lesion  POST-OPERATIVE DIAGNOSIS:  Anal lesion  PROCEDURE: ANAL EXAM UNDER ANESTHESIA WITH BIOPSY    Surgeon(s): [name], MD  ASSISTANT: none   ANESTHESIA:   local and MAC  SPECIMEN:  Source of Specimen:  perianal lesion  DISPOSITION OF SPECIMEN:  PATHOLOGY  COUNTS:  YES  PLAN OF CARE: Discharge to home after PACU  PATIENT DISPOSITION:  PACU - hemodynamically stable.  INDICATION: 42 y.o. M with non-healing anal lesion.     OR FINDINGS: anal lesion has mostly healed.  Biopsy taken.  No fistula noted  DESCRIPTION: the patient was identified in the preoperative holding area and taken to the OR where they were laid on the operating room table.  MAC anesthesia was induced without difficulty. The patient was then positioned in prone jackknife position with buttocks gently taped apart.  The patient was then prepped and draped in usual sterile fashion.  SCDs were noted to be in place prior to the initiation of anesthesia. A surgical timeout was performed indicating the correct patient, procedure, positioning and need for preoperative antibiotics.  A rectal block was performed using Marcaine with epinephrine.    I began with a digital rectal exam.  This was normal.  The posterior midline mass I palpated earlier in the office was not present.  I then placed a Hill-Ferguson anoscope into the anal canal and evaluated this completely.  The patient had grade 1 internal hemorrhoids.  There was no internal opening noted.  I evaluated the left posterior perianal region.  A scar could be palpated where the previous lesion was but there was no opening in the skin.  This was probed with a fistula probe and I still could not find any opening.  There was a small lesion near this that was removed with Metzenbaum  scissors.  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  At the completion of this a dressing was applied and the patient was awakened from anesthesia and sent to the postanesthesia care unit in stable condition.  All counts were correct per operating room staff.

## 2018-02-13 NOTE — Discharge Instructions (Addendum)
Beginning the day after surgery:  You may sit in a tub of warm water 2-3 times a day to relieve discomfort.  Eat a regular diet high in fiber.  Avoid foods that give you constipation or diarrhea.  Avoid foods that are difficult to digest, such as seeds, nuts, corn or popcorn.  Do not go any longer than 2 days without a bowel movement.  You may take a dose of Milk of Magnesia if you become constipated.    Drink 6-8 glasses of water daily.  Walking is encouraged.  Avoid strenuous activity and heavy lifting for one month after surgery.    Call the office if you have any questions or concerns.  Call immediately if you develop:   Excessive rectal bleeding (more than a cup or passing large clots)  Increased discomfort  Fever greater than 100 F  Difficulty urinating  OTC pain medication as need-Tylenol next dose after 1 pm as need Motrin,Advil Aleve  Post Anesthesia Home Care Instructions  Activity: Get plenty of rest for the remainder of the day. A responsible individual must stay with you for 24 hours following the procedure.  For the next 24 hours, DO NOT: -Drive a car -Paediatric nurse -Drink alcoholic beverages -Take any medication unless instructed by your physician -Make any legal decisions or sign important papers.  Meals: Start with liquid foods such as gelatin or soup. Progress to regular foods as tolerated. Avoid greasy, spicy, heavy foods. If nausea and/or vomiting occur, drink only clear liquids until the nausea and/or vomiting subsides. Call your physician if vomiting continues.  Special Instructions/Symptoms: Your throat may feel dry or sore from the anesthesia or the breathing tube placed in your throat during surgery. If this causes discomfort, gargle with warm salt water. The discomfort should disappear within 24 hours.  If you had a scopolamine patch placed behind your ear for the management of post- operative nausea and/or vomiting:  1. The medication in the  patch is effective for 72 hours, after which it should be removed.  Wrap patch in a tissue and discard in the trash. Wash hands thoroughly with soap and water. 2. You may remove the patch earlier than 72 hours if you experience unpleasant side effects which may include dry mouth, dizziness or visual disturbances. 3. Avoid touching the patch. Wash your hands with soap and water after contact with the patch.

## 2018-02-13 NOTE — Transfer of Care (Addendum)
Last Vitals:  Vitals Value Taken Time  BP    Temp    Pulse 88 02/13/2018 10:24 AM  Resp 18 02/13/2018 10:24 AM  SpO2 96 % 02/13/2018 10:24 AM  Vitals shown include unvalidated device data.  Last Pain:  Vitals:   02/13/18 0843  TempSrc:   PainSc: 0-No pain      Patients Stated Pain Goal: 5 (02/13/18 1448)  Immediate Anesthesia Transfer of Care Note  Patient: Tyler Gross  Procedure(s) Performed: Procedure(s) (LRB): ANAL EXAM UNDER ANESTHESIA WITH BIOPSY (N/A)  Patient Location: PACU  Anesthesia Type: mac  Level of Consciousness: awake, alert  and oriented  Airway & Oxygen Therapy: Patient Spontanous Breathing and Patient connected to nasal cannula oxygen  Post-op Assessment: Report given to PACU RN and Post -op Vital signs reviewed and stable  Post vital signs: Reviewed and stable  Complications: No apparent anesthesia complications

## 2018-02-14 ENCOUNTER — Encounter (HOSPITAL_BASED_OUTPATIENT_CLINIC_OR_DEPARTMENT_OTHER): Payer: Self-pay | Admitting: General Surgery

## 2018-03-02 ENCOUNTER — Encounter: Payer: Self-pay | Admitting: Family Medicine

## 2018-03-02 ENCOUNTER — Other Ambulatory Visit: Payer: Self-pay | Admitting: Family Medicine

## 2018-03-03 ENCOUNTER — Other Ambulatory Visit: Payer: Self-pay

## 2018-03-03 MED ORDER — ROSUVASTATIN CALCIUM 10 MG PO TABS: 10.0000 mg | ORAL_TABLET | Freq: Every day | ORAL | 1 refills | Status: DC

## 2018-03-03 MED ORDER — BUPROPION HCL ER (XL) 150 MG PO TB24: 150.0000 mg | ORAL_TABLET | Freq: Every day | ORAL | 3 refills | Status: DC

## 2018-03-03 NOTE — Telephone Encounter (Signed)
Ok to refill 

## 2018-04-09 ENCOUNTER — Encounter (HOSPITAL_COMMUNITY): Payer: Self-pay | Admitting: Emergency Medicine

## 2018-04-09 ENCOUNTER — Encounter (HOSPITAL_COMMUNITY): Payer: Self-pay | Admitting: *Deleted

## 2018-04-09 ENCOUNTER — Emergency Department (HOSPITAL_COMMUNITY)
Admission: EM | Admit: 2018-04-09 | Discharge: 2018-04-09 | Disposition: A | Discharge status: Other Acute Inpatient Hospital | Payer: Commercial Managed Care - PPO | Attending: Emergency Medicine | Admitting: Emergency Medicine

## 2018-04-09 ENCOUNTER — Inpatient Hospital Stay (HOSPITAL_COMMUNITY)
Admission: AD | Admit: 2018-04-09 | Discharge: 2018-04-12 | DRG: 885 | Disposition: A | Discharge status: Home / Self Care | Payer: Commercial Managed Care - PPO | Source: Intra-hospital | Attending: Psychiatry | Admitting: Psychiatry

## 2018-04-09 ENCOUNTER — Other Ambulatory Visit: Payer: Self-pay

## 2018-04-09 DIAGNOSIS — Z818 Family history of other mental and behavioral disorders: Secondary | ICD-10-CM | POA: Diagnosis not present

## 2018-04-09 DIAGNOSIS — Z79899 Other long term (current) drug therapy: Secondary | ICD-10-CM | POA: Insufficient documentation

## 2018-04-09 DIAGNOSIS — F1024 Alcohol dependence with alcohol-induced mood disorder: Secondary | ICD-10-CM | POA: Diagnosis not present

## 2018-04-09 DIAGNOSIS — Z91030 Bee allergy status: Secondary | ICD-10-CM

## 2018-04-09 DIAGNOSIS — R45851 Suicidal ideations: Secondary | ICD-10-CM | POA: Diagnosis present

## 2018-04-09 DIAGNOSIS — G4733 Obstructive sleep apnea (adult) (pediatric): Secondary | ICD-10-CM | POA: Diagnosis present

## 2018-04-09 DIAGNOSIS — F322 Major depressive disorder, single episode, severe without psychotic features: Secondary | ICD-10-CM | POA: Diagnosis not present

## 2018-04-09 DIAGNOSIS — F419 Anxiety disorder, unspecified: Secondary | ICD-10-CM | POA: Diagnosis not present

## 2018-04-09 DIAGNOSIS — Z811 Family history of alcohol abuse and dependence: Secondary | ICD-10-CM | POA: Diagnosis not present

## 2018-04-09 DIAGNOSIS — E785 Hyperlipidemia, unspecified: Secondary | ICD-10-CM | POA: Diagnosis present

## 2018-04-09 DIAGNOSIS — F410 Panic disorder [episodic paroxysmal anxiety] without agoraphobia: Secondary | ICD-10-CM | POA: Diagnosis present

## 2018-04-09 DIAGNOSIS — F332 Major depressive disorder, recurrent severe without psychotic features: Secondary | ICD-10-CM | POA: Diagnosis present

## 2018-04-09 DIAGNOSIS — I10 Essential (primary) hypertension: Secondary | ICD-10-CM | POA: Diagnosis present

## 2018-04-09 DIAGNOSIS — Z888 Allergy status to other drugs, medicaments and biological substances status: Secondary | ICD-10-CM

## 2018-04-09 DIAGNOSIS — G4700 Insomnia, unspecified: Secondary | ICD-10-CM | POA: Diagnosis present

## 2018-04-09 DIAGNOSIS — Z87891 Personal history of nicotine dependence: Secondary | ICD-10-CM | POA: Diagnosis not present

## 2018-04-09 DIAGNOSIS — F1021 Alcohol dependence, in remission: Secondary | ICD-10-CM

## 2018-04-09 LAB — CBC
HCT: 49.2 % (ref 39.0–52.0)
HEMOGLOBIN: 16 g/dL (ref 13.0–17.0)
MCH: 29 pg (ref 26.0–34.0)
MCHC: 32.5 g/dL (ref 30.0–36.0)
MCV: 89.1 fL (ref 78.0–100.0)
Platelets: 328 10*3/uL (ref 150–400)
RBC: 5.52 MIL/uL (ref 4.22–5.81)
RDW: 13.9 % (ref 11.5–15.5)
WBC: 10 10*3/uL (ref 4.0–10.5)

## 2018-04-09 LAB — RAPID URINE DRUG SCREEN, HOSP PERFORMED
AMPHETAMINES: NOT DETECTED
BENZODIAZEPINES: NOT DETECTED
Barbiturates: NOT DETECTED
COCAINE: NOT DETECTED
OPIATES: NOT DETECTED
TETRAHYDROCANNABINOL: NOT DETECTED

## 2018-04-09 LAB — COMPREHENSIVE METABOLIC PANEL
ALBUMIN: 4.6 g/dL (ref 3.5–5.0)
ALK PHOS: 62 U/L (ref 38–126)
ALT: 65 U/L — ABNORMAL HIGH (ref 0–44)
AST: 59 U/L — AB (ref 15–41)
Anion gap: 15 (ref 5–15)
BUN: 16 mg/dL (ref 6–20)
CALCIUM: 9.8 mg/dL (ref 8.9–10.3)
CO2: 24 mmol/L (ref 22–32)
CREATININE: 1.12 mg/dL (ref 0.61–1.24)
Chloride: 109 mmol/L (ref 98–111)
GFR calc Af Amer: >60 mL/min (ref >60)
GFR calc non Af Amer: >60 mL/min (ref >60)
GLUCOSE: 133 mg/dL — AB (ref 70–99)
Potassium: 3.9 mmol/L (ref 3.5–5.1)
Sodium: 148 mmol/L — ABNORMAL HIGH (ref 135–145)
TOTAL PROTEIN: 8 g/dL (ref 6.5–8.1)
Total Bilirubin: 0.8 mg/dL (ref 0.3–1.2)

## 2018-04-09 LAB — ACETAMINOPHEN LEVEL

## 2018-04-09 LAB — SALICYLATE LEVEL

## 2018-04-09 LAB — ETHANOL: Alcohol, Ethyl (B): <10 mg/dL (ref <10)

## 2018-04-09 MED ORDER — PANTOPRAZOLE SODIUM 40 MG PO TBEC
40.0000 mg | DELAYED_RELEASE_TABLET | Freq: Every day | ORAL | Status: DC
  Filled 2018-04-09: qty 1

## 2018-04-09 MED ORDER — HYDROXYZINE HCL 25 MG PO TABS
25.0000 mg | ORAL_TABLET | Freq: Three times a day (TID) | ORAL | Status: DC | PRN
  Administered 2018-04-09: 25 mg via ORAL
  Filled 2018-04-09: qty 1

## 2018-04-09 MED ORDER — ATENOLOL 100 MG PO TABS: 100.0000 mg | ORAL_TABLET | Freq: Every day | ORAL | Status: DC

## 2018-04-09 MED ORDER — TRAZODONE HCL 50 MG PO TABS
50.0000 mg | ORAL_TABLET | Freq: Every evening | ORAL | Status: DC | PRN
  Administered 2018-04-09 – 2018-04-11 (×3): 50 mg via ORAL
  Filled 2018-04-09 (×3): qty 1

## 2018-04-09 MED ORDER — ROSUVASTATIN CALCIUM 5 MG PO TABS
10.0000 mg | ORAL_TABLET | Freq: Every day | ORAL | Status: DC
  Administered 2018-04-10 – 2018-04-11 (×2): 10 mg via ORAL
  Filled 2018-04-09 (×4): qty 1
  Filled 2018-04-09: qty 2
  Filled 2018-04-09 (×2): qty 1

## 2018-04-09 MED ORDER — ASPIRIN EC 81 MG PO TBEC: 81.0000 mg | DELAYED_RELEASE_TABLET | Freq: Every morning | ORAL | Status: DC

## 2018-04-09 MED ORDER — LISINOPRIL 20 MG PO TABS: 20.0000 mg | ORAL_TABLET | Freq: Every day | ORAL | Status: DC

## 2018-04-09 MED ORDER — BUPROPION HCL ER (XL) 150 MG PO TB24
150.0000 mg | ORAL_TABLET | Freq: Every day | ORAL | Status: DC
  Filled 2018-04-09: qty 1

## 2018-04-09 MED ORDER — CLOMIPHENE CITRATE 50 MG PO TABS: 25.0000 mg | ORAL_TABLET | ORAL | Status: DC

## 2018-04-09 MED ORDER — GEMFIBROZIL 600 MG PO TABS
600.0000 mg | ORAL_TABLET | Freq: Two times a day (BID) | ORAL | Status: DC
  Administered 2018-04-10 – 2018-04-12 (×5): 600 mg via ORAL
  Filled 2018-04-09 (×10): qty 1

## 2018-04-09 MED ORDER — LISINOPRIL-HYDROCHLOROTHIAZIDE 20-12.5 MG PO TABS: 1.0000 | ORAL_TABLET | Freq: Every day | ORAL | Status: DC

## 2018-04-09 MED ORDER — AMLODIPINE BESYLATE 5 MG PO TABS
5.0000 mg | ORAL_TABLET | Freq: Every day | ORAL | Status: DC
  Filled 2018-04-09: qty 1

## 2018-04-09 MED ORDER — ACETAMINOPHEN 325 MG PO TABS: 650.0000 mg | ORAL_TABLET | Freq: Four times a day (QID) | ORAL | Status: DC | PRN

## 2018-04-09 MED ORDER — HYDROCHLOROTHIAZIDE 12.5 MG PO CAPS: 12.5000 mg | ORAL_CAPSULE | Freq: Every day | ORAL | Status: DC

## 2018-04-09 MED ORDER — BUSPIRONE HCL 10 MG PO TABS
10.0000 mg | ORAL_TABLET | Freq: Two times a day (BID) | ORAL | Status: DC
  Administered 2018-04-09 – 2018-04-12 (×6): 10 mg via ORAL
  Filled 2018-04-09: qty 1
  Filled 2018-04-09: qty 2
  Filled 2018-04-09 (×9): qty 1

## 2018-04-09 MED ORDER — BUSPIRONE HCL 10 MG PO TABS: 10.0000 mg | ORAL_TABLET | Freq: Two times a day (BID) | ORAL | Status: DC

## 2018-04-09 MED ORDER — ALUM & MAG HYDROXIDE-SIMETH 200-200-20 MG/5ML PO SUSP: 30.0000 mL | ORAL | Status: DC | PRN

## 2018-04-09 MED ORDER — MAGNESIUM HYDROXIDE 400 MG/5ML PO SUSP: 30.0000 mL | Freq: Every day | ORAL | Status: DC | PRN

## 2018-04-09 MED ORDER — ANASTROZOLE 1 MG PO TABS: 1.0000 mg | ORAL_TABLET | Freq: Every morning | ORAL | Status: DC

## 2018-04-09 NOTE — ED Notes (Signed)
Pt given paper scrubs to change into 

## 2018-04-09 NOTE — ED Provider Notes (Signed)
[facility] DEPT Provider Note   CSN: [id number] Arrival date & time: 04/09/18  1519     History   Chief Complaint Chief Complaint  Patient presents with  . Panic Attack  . Suicidal    HPI Tyler Gross is a 42 y.o. male.  Patient with hx depression, c/o worsening depression for 6 months, and especially in past 2 weeks. Denies specific inciting event or stressor. States work often makes him feel worse, and at times feels worthless. Partner adds that his mother recently talked him down from plan to jump off a bridge. Pt also notes drinking more etoh than normal, but states not heavily every day, and recently stopped for 30 days without any withdrawal symptoms. States compliant w home meds, no recent change. Denies overdose. Is eating and drinking, states more than normal. Is able to sleep at night, but problems w nightmares. Denies acute physical health problems.   The history is provided by the patient and the spouse.    Past Medical History:  Diagnosis Date  . Anal lesion    nonhealing  . Depression   . ED (erectile dysfunction)   . GAD (generalized anxiety disorder)   . Gynecomastia, male    followed by dr Shellia Cleverly  . High serum estradiol   . History of kidney stones   . Hypertension   . Mixed hyperlipidemia   . OSA on CPAP    per last study 07-31-2013  severe osa  . Pre-diabetes   . Primary hypogonadism in male    endocrinologist-  dr Shellia Cleverly  . Wears glasses     Patient Active Problem List   Diagnosis Date Noted  . Depressive disorder 01/24/2018  . Vitamin B deficiency 01/24/2018  . Skin ulceration, limited to breakdown of skin ([place]) 12/20/2017  . GAD (generalized anxiety disorder) 12/20/2017  . Testicular discomfort 01/17/2017  . Obesity, unspecified 01/09/2017  . Elevated liver enzymes 11/08/2016  . Impaired fasting glucose 10/07/2016  . Potential exposure to STD 07/11/2015  . Gynecomastia 01/06/2014  . ED (erectile  dysfunction) 01/06/2014  . Estrogen excess 01/06/2014  . Fatigue 12/25/2013  . Hypogonadism male 06/25/2013  . OSA (obstructive sleep apnea) 05/19/2013  . Hypertriglyceridemia 04/14/2010  . Essential hypertension 04/14/2010  . PALPITATIONS 04/14/2010  . CHEST PAIN-UNSPECIFIED 04/14/2010    Past Surgical History:  Procedure Laterality Date  . CARDIOVASCULAR STRESS TEST  10/05/2009   normal nuclear study w/ no ischemia/  normal LV function and wall motion , ef 71%  . COLONOSCOPY  last one 2004  . EVALUATION UNDER ANESTHESIA WITH ANAL FISTULECTOMY N/A 02/13/2018   Procedure: ANAL EXAM UNDER ANESTHESIA WITH BIOPSY;  Surgeon: [name], MD;  Location: [facility];  Service: General;  Laterality: N/A;  . ORCHIECTOMY Left 1990   w/ placement prosthesis (for torsion)  . URETEROLITHOTOMY  1999        Home Medications    Prior to Admission medications   Medication Sig Start Date End Date Taking? Authorizing Provider  amLODipine (NORVASC) 5 MG tablet Take 1 tablet (5 mg total) by mouth daily. Patient taking differently: Take 5 mg by mouth every morning.  01/03/18  Yes Luetta Nutting, DO  anastrozole (ARIMIDEX) 1 MG tablet Take 1 mg by mouth every morning.  06/12/17  Yes [provider]  aspirin (ASPIRIN LOW DOSE) 81 MG tablet Take 81 mg by mouth every morning.    Yes [provider]  atenolol (TENORMIN) 100 MG tablet Take 1  tablet (100 mg total) by mouth daily. Patient taking differently: Take 100 mg by mouth every morning.  01/03/18  Yes Luetta Nutting, DO  buPROPion (WELLBUTRIN XL) 150 MG 24 hr tablet Take 1 tablet (150 mg total) by mouth daily. 03/03/18  Yes Luetta Nutting, DO  busPIRone (BUSPAR) 10 MG tablet Take 1 tablet (10 mg total) by mouth daily. Patient taking differently: Take 10 mg by mouth 2 (two) times daily.  01/03/18  Yes Luetta Nutting, DO  Cholecalciferol (VITAMIN D3 PO) Take by mouth daily.    Yes [provider]  clomiPHENE  (CLOMID) 50 MG tablet Take 0.5 tablets (25 mg total) by mouth daily. Patient taking differently: Take 25 mg by mouth every Monday, Wednesday, and Friday.  01/09/17  Yes Philemon Kingdom, MD  Cranberry Extract 250 MG TABS Take 1 tablet by mouth daily.    Yes [provider]  gemfibrozil (LOPID) 600 MG tablet Take 1 tablet (600 mg total) by mouth daily. Patient taking differently: Take 600 mg by mouth 2 (two) times daily before a meal.  01/03/18  Yes Luetta Nutting, DO  lisinopril-hydrochlorothiazide (PRINZIDE,ZESTORETIC) 20-12.5 MG tablet Take 1 tablet by mouth daily. Patient taking differently: Take 1 tablet by mouth every morning.  01/03/18  Yes Luetta Nutting, DO  Multiple Vitamin (MULTIVITAMIN WITH MINERALS) TABS tablet Take 1 tablet by mouth daily.   Yes [provider]  Omega-3 Fatty Acids (FISH OIL PO) Take 2,000 mg by mouth 2 (two) times daily.    Yes [provider]  pantoprazole (PROTONIX) 40 MG tablet Take 1 tablet (40 mg total) by mouth daily. Patient taking differently: Take 40 mg by mouth every morning.  01/03/18  Yes Luetta Nutting, DO  potassium chloride (K-DUR,KLOR-CON) 10 MEQ tablet Take 1 tablet (10 mEq total) by mouth daily. Patient taking differently: Take 10 mEq by mouth every morning.  01/03/18  Yes Luetta Nutting, DO  rosuvastatin (CRESTOR) 10 MG tablet Take 1 tablet (10 mg total) by mouth at bedtime. TAKE ONE TABLET BY MOUTH NIGHTLY AT BEDTIME 03/03/18  Yes Luetta Nutting, DO  mupirocin ointment (BACTROBAN) 2 % Place 1 application into the nose 2 (two) times daily. Patient not taking: Reported on 04/09/2018 01/24/18   Luetta Nutting, DO    Family History Family History  Problem Relation Age of Onset  . Cancer Mother        breast cancer: stage I  . Hypertension Mother     Social History Social History   Tobacco Use  . Smoking status: Former Smoker    Years: 10.00    Types: Cigarettes    Last attempt to quit: 02/07/2009    Years since quitting:  9.1  . Smokeless tobacco: Never Used  Substance Use Topics  . Alcohol use: Yes    Comment: Daily  . Drug use: No     Allergies   Atorvastatin; Bee venom; and Sulfamethoxazole-trimethoprim   Review of Systems Review of Systems  Constitutional: Negative for fever.  HENT: Negative for sore throat.   Eyes: Negative for redness.  Respiratory: Negative for shortness of breath.   Cardiovascular: Negative for chest pain.  Gastrointestinal: Negative for abdominal pain.  Genitourinary: Negative for flank pain.  Musculoskeletal: Negative for back pain and neck pain.  Skin: Negative for rash.  Neurological: Negative for headaches.  Hematological: Does not bruise/bleed easily.  Psychiatric/Behavioral: Positive for dysphoric mood and suicidal ideas. The patient is nervous/anxious.      Physical Exam Updated Vital Signs BP (!) 153/110 (  BP Location: Left Arm)   Pulse (!) 118   Temp 100.3 F (37.9 C) (Oral)   Resp 16   Ht 1.829 m (6')   Wt 122.5 kg   SpO2 98%   BMI 36.62 kg/m   Physical Exam  Constitutional: He appears well-developed and well-nourished.  Very anxious, tearful.   HENT:  Head: Atraumatic.  Mouth/Throat: Oropharynx is clear and moist.  Eyes: Pupils are equal, round, and reactive to light. Conjunctivae are normal.  Neck: Neck supple. No tracheal deviation present. No thyromegaly present.  Cardiovascular: Normal rate, regular rhythm, normal heart sounds and intact distal pulses.  Pulmonary/Chest: Effort normal and breath sounds normal. No accessory muscle usage. No respiratory distress.  Abdominal: Soft. He exhibits no distension. There is no tenderness.  Musculoskeletal: He exhibits no edema.  Neurological: He is alert.  Speech normal. Steady gait.   Skin: Skin is warm and dry. No rash noted.  Psychiatric:  Very anxious, tearful. +SI.   Nursing note and vitals reviewed.    ED Treatments / Results  Labs (all labs ordered are listed, but only abnormal  results are displayed) Results for orders placed or performed during the hospital encounter of 04/09/18  Comprehensive metabolic panel  Result Value Ref Range   Sodium 148 (H) 135 - 145 mmol/L   Potassium 3.9 3.5 - 5.1 mmol/L   Chloride 109 98 - 111 mmol/L   CO2 24 22 - 32 mmol/L   Glucose, Bld 133 (H) 70 - 99 mg/dL   BUN 16 6 - 20 mg/dL   Creatinine, Ser 1.12 0.61 - 1.24 mg/dL   Calcium 9.8 8.9 - 10.3 mg/dL   Total Protein 8.0 6.5 - 8.1 g/dL   Albumin 4.6 3.5 - 5.0 g/dL   AST 59 (H) 15 - 41 U/L   ALT 65 (H) 0 - 44 U/L   Alkaline Phosphatase 62 38 - 126 U/L   Total Bilirubin 0.8 0.3 - 1.2 mg/dL   GFR calc non Af Amer >60 >60 mL/min   GFR calc Af Amer >60 >60 mL/min   Anion gap 15 5 - 15  cbc  Result Value Ref Range   WBC 10.0 4.0 - 10.5 K/uL   RBC 5.52 4.22 - 5.81 MIL/uL   Hemoglobin 16.0 13.0 - 17.0 g/dL   HCT 49.2 39.0 - 52.0 %   MCV 89.1 78.0 - 100.0 fL   MCH 29.0 26.0 - 34.0 pg   MCHC 32.5 30.0 - 36.0 g/dL   RDW 13.9 11.5 - 15.5 %   Platelets 328 150 - 400 K/uL  Rapid urine drug screen (hospital performed)  Result Value Ref Range   Opiates NONE DETECTED NONE DETECTED   Cocaine NONE DETECTED NONE DETECTED   Benzodiazepines NONE DETECTED NONE DETECTED   Amphetamines NONE DETECTED NONE DETECTED   Tetrahydrocannabinol NONE DETECTED NONE DETECTED   Barbiturates NONE DETECTED NONE DETECTED    EKG None  Radiology No results found.  Procedures Procedures (including critical care time)  Medications Ordered in ED Medications - No data to display   Initial Impression / Assessment and Plan / ED Course  I have reviewed the triage vital signs and the nursing notes.  Pertinent labs & imaging results that were available during my care of the patient were reviewed by me and considered in my medical decision making (see chart for details).  Labs sent.   Reviewed nursing notes and prior charts for additional history.   [facility] team consulted.   Ativan 1  mg po for  anxiety.   Disposition per [facility] team - feel likely pt will benefit from inpatient [facility] admission.   Labs reviewed - cbc normal, uds neg. Po fluids/water provided. Meal.   Await bh eval and placement.   Pt appears medically clear/stable for placement.   Final Clinical Impressions(s) / ED Diagnoses   Final diagnoses:  None    ED Discharge Orders    None       Lajean Saver, MD 04/09/18 1731
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
## 2018-04-09 NOTE — Progress Notes (Signed)
VOL paperwork signed and faxed to [facility] for review.   Lind Covert, MSW, LCSW Therapeutic Triage Specialist  [phone]

## 2018-04-09 NOTE — ED Notes (Signed)
[facility] transport arrived and pt enroute to [facility]

## 2018-04-09 NOTE — Progress Notes (Signed)
Tyler Gross is a 42 year old male pt admitted on voluntary basis. On admission, Biagio appears anxious and fidgety and does endorse passive SI but is able to contract for safety on the unit. He spoke about how he has had depression for awhile and has had suicidal thoughts before but reports that he has never acted on them. He reports that he has been drinking daily and reports that he has been using that as a coping mechanism. He reports on-going work issues as a stressor currently. He denies any other substance abuse issues. He reports that he has been taking all his medications as prescribed. He reports that he lives with his partner and reports that he will go back to the same living situation upon discharge. Dwon was escorted to the unit, oriented to the milieu and safety maintained.

## 2018-04-09 NOTE — ED Triage Notes (Signed)
Pt reports that being having issues with anxiety and thoughts of SI yesterday with plan of intent to jump off bridge [address] and was on phone with his mother who talked pt down. Pt reports that today woke up with severe panic attack. 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

## 2018-04-09 NOTE — Tx Team (Signed)
Initial Treatment Plan 04/09/2018 10:49 PM [name] ZHY:[id number]    PATIENT STRESSORS: Occupational concerns Substance abuse   PATIENT STRENGTHS: Ability for insight Average or above average intelligence Capable of independent living General fund of knowledge Motivation for treatment/growth   PATIENT IDENTIFIED PROBLEMS: Depression Anxiety Alcohol Abuse Suicidal thoughts "I need help with my mood and self-esteem"                     DISCHARGE CRITERIA:  Ability to meet basic life and health needs Improved stabilization in mood, thinking, and/or behavior Reduction of life-threatening or endangering symptoms to within safe limits Verbal commitment to aftercare and medication compliance Withdrawal symptoms are absent or subacute and managed without 24-hour nursing intervention  PRELIMINARY DISCHARGE PLAN: Attend aftercare/continuing care group Return to previous living arrangement  PATIENT/FAMILY INVOLVEMENT: This treatment plan has been presented to and reviewed with the patient, Tyler Gross, and/or family member, .  The patient and family have been given the opportunity to ask questions and make suggestions.  Demarcus Thielke, [place], [place] 04/09/2018, 10:49 PM

## 2018-04-09 NOTE — BH Assessment (Signed)
[facility] Assessment Progress Note  Pt is accepted to [facility] 400-2 by Dr. Parke Poisson. Please have pt sign voluntary form and fax to [facility] at [phone]. Pt can come after 2030. Call report to [phone]. Pt's RN, Raven, notified.   Kenna Gilbert. Lovena Le, [place], [place], [facility] Counselor

## 2018-04-09 NOTE — ED Notes (Signed)
Bed: WLPT4 Expected date:  Expected time:  Means of arrival:  Comments: 

## 2018-04-10 DIAGNOSIS — F419 Anxiety disorder, unspecified: Secondary | ICD-10-CM

## 2018-04-10 DIAGNOSIS — F332 Major depressive disorder, recurrent severe without psychotic features: Principal | ICD-10-CM

## 2018-04-10 DIAGNOSIS — F1024 Alcohol dependence with alcohol-induced mood disorder: Secondary | ICD-10-CM

## 2018-04-10 DIAGNOSIS — Z811 Family history of alcohol abuse and dependence: Secondary | ICD-10-CM

## 2018-04-10 DIAGNOSIS — Z818 Family history of other mental and behavioral disorders: Secondary | ICD-10-CM

## 2018-04-10 LAB — LIPID PANEL
CHOL/HDL RATIO: 6.2 ratio
CHOLESTEROL: 191 mg/dL (ref 0–200)
HDL: 31 mg/dL — AB (ref >40)
LDL Cholesterol: UNDETERMINED mg/dL (ref 0–99)
TRIGLYCERIDES: 461 mg/dL — AB (ref <150)
VLDL: UNDETERMINED mg/dL (ref 0–40)

## 2018-04-10 LAB — HEMOGLOBIN A1C
Hgb A1c MFr Bld: 6.3 % — ABNORMAL HIGH (ref 4.8–5.6)
Mean Plasma Glucose: 134.11 mg/dL

## 2018-04-10 LAB — TSH: TSH: 3.046 u[IU]/mL (ref 0.350–4.500)

## 2018-04-10 MED ORDER — DULOXETINE HCL 30 MG PO CPEP
30.0000 mg | ORAL_CAPSULE | Freq: Every day | ORAL | Status: DC
  Administered 2018-04-11: 30 mg via ORAL
  Filled 2018-04-10 (×3): qty 1

## 2018-04-10 MED ORDER — CHLORDIAZEPOXIDE HCL 25 MG PO CAPS: 25.0000 mg | ORAL_CAPSULE | Freq: Four times a day (QID) | ORAL | Status: DC | PRN

## 2018-04-10 MED ORDER — HYDROCHLOROTHIAZIDE 12.5 MG PO CAPS
12.5000 mg | ORAL_CAPSULE | Freq: Every day | ORAL | Status: DC
  Administered 2018-04-11 – 2018-04-12 (×2): 12.5 mg via ORAL
  Filled 2018-04-10 (×4): qty 1

## 2018-04-10 MED ORDER — CHLORDIAZEPOXIDE HCL 25 MG PO CAPS: 25.0000 mg | ORAL_CAPSULE | ORAL | Status: DC

## 2018-04-10 MED ORDER — HYDROXYZINE HCL 25 MG PO TABS: 25.0000 mg | ORAL_TABLET | Freq: Four times a day (QID) | ORAL | Status: DC | PRN

## 2018-04-10 MED ORDER — ADULT MULTIVITAMIN W/MINERALS CH
1.0000 | ORAL_TABLET | Freq: Every day | ORAL | Status: DC
  Administered 2018-04-10 – 2018-04-12 (×3): 1 via ORAL
  Filled 2018-04-10 (×6): qty 1

## 2018-04-10 MED ORDER — CHLORDIAZEPOXIDE HCL 25 MG PO CAPS: 25.0000 mg | ORAL_CAPSULE | Freq: Every day | ORAL | Status: DC

## 2018-04-10 MED ORDER — AMLODIPINE BESYLATE 5 MG PO TABS
5.0000 mg | ORAL_TABLET | Freq: Every day | ORAL | Status: DC
  Administered 2018-04-10 – 2018-04-12 (×3): 5 mg via ORAL
  Filled 2018-04-10 (×6): qty 1

## 2018-04-10 MED ORDER — LISINOPRIL 20 MG PO TABS
20.0000 mg | ORAL_TABLET | Freq: Every day | ORAL | Status: DC
  Administered 2018-04-11 – 2018-04-12 (×2): 20 mg via ORAL
  Filled 2018-04-10 (×4): qty 1

## 2018-04-10 MED ORDER — CHLORDIAZEPOXIDE HCL 25 MG PO CAPS
25.0000 mg | ORAL_CAPSULE | Freq: Three times a day (TID) | ORAL | Status: DC
  Administered 2018-04-11 (×2): 25 mg via ORAL
  Filled 2018-04-10 (×2): qty 1

## 2018-04-10 MED ORDER — CHLORDIAZEPOXIDE HCL 25 MG PO CAPS
25.0000 mg | ORAL_CAPSULE | Freq: Four times a day (QID) | ORAL | Status: AC
  Administered 2018-04-10 – 2018-04-11 (×4): 25 mg via ORAL
  Filled 2018-04-10 (×4): qty 1

## 2018-04-10 MED ORDER — THIAMINE HCL 100 MG/ML IJ SOLN: 100.0000 mg | Freq: Once | INTRAMUSCULAR | Status: DC

## 2018-04-10 MED ORDER — VITAMIN B-1 100 MG PO TABS
100.0000 mg | ORAL_TABLET | Freq: Once | ORAL | Status: AC
  Administered 2018-04-10: 100 mg via ORAL
  Filled 2018-04-10 (×2): qty 1

## 2018-04-10 MED ORDER — ONDANSETRON 4 MG PO TBDP: 4.0000 mg | ORAL_TABLET | Freq: Four times a day (QID) | ORAL | Status: DC | PRN

## 2018-04-10 MED ORDER — VITAMIN B-1 100 MG PO TABS
100.0000 mg | ORAL_TABLET | Freq: Every day | ORAL | Status: DC
  Administered 2018-04-11 – 2018-04-12 (×2): 100 mg via ORAL
  Filled 2018-04-10 (×4): qty 1

## 2018-04-10 MED ORDER — ATENOLOL 50 MG PO TABS
100.0000 mg | ORAL_TABLET | Freq: Every day | ORAL | Status: DC
  Administered 2018-04-10 – 2018-04-12 (×3): 100 mg via ORAL
  Filled 2018-04-10 (×2): qty 1
  Filled 2018-04-10: qty 4
  Filled 2018-04-10 (×3): qty 1
  Filled 2018-04-10: qty 2

## 2018-04-10 MED ORDER — PANTOPRAZOLE SODIUM 40 MG PO TBEC
40.0000 mg | DELAYED_RELEASE_TABLET | Freq: Every day | ORAL | Status: DC
  Administered 2018-04-10 – 2018-04-12 (×3): 40 mg via ORAL
  Filled 2018-04-10 (×6): qty 1

## 2018-04-10 MED ORDER — LOPERAMIDE HCL 2 MG PO CAPS: 2.0000 mg | ORAL_CAPSULE | ORAL | Status: DC | PRN

## 2018-04-10 NOTE — Plan of Care (Signed)
  Problem: Coping: Goal: Ability to verbalize frustrations and anger appropriately will improve Outcome: Progressing   Problem: Safety: Goal: Periods of time without injury will increase Outcome: Progressing   Problem: Medication: Goal: Compliance with prescribed medication regimen will improve Outcome: Progressing
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
## 2018-04-10 NOTE — Progress Notes (Signed)
Adult Psychoeducational Group Note  Date:  04/10/2018 Time:  9:40 PM  Group Topic/Focus:  Wrap-Up Group:   The focus of this group is to help patients review their daily goal of treatment and discuss progress on daily workbooks.  Participation Level:  Active  Participation Quality:  Appropriate  Affect:  Appropriate  Cognitive:  Appropriate  Insight: Appropriate  Engagement in Group:  Engaged  Modes of Intervention:  Discussion  Additional Comments:  Pt stated he has been feeling depressed and anxious but the doctor told him those are symptoms of withdraw and prescribed him something for him that has helped.  Pt had a visit with family and that increased his day being good.  Pt rated the day at a 6/10.  Kazuto Sevey 04/10/2018, 9:40 PM

## 2018-04-10 NOTE — Progress Notes (Signed)
DAR NOTE: Patient presents with anxious affect and depressed mood. Pt stated he is not doing so well, still very depressed and endorsing some suicide  Ideation. Denies pain, auditory and visual hallucinations. Reports fair sleep, fair appetite, low energy and poor concentration. Rates depression at 10, hopelessness at 10, and anxiety at 10.  Maintained on routine safety checks.  Medications given as prescribed.  Support and encouragement offered as needed. States goal for today is "surviving in here." Will continue to monitor.

## 2018-04-10 NOTE — BHH Counselor (Signed)
CSW met with pt to complete PSA and SPE. SPE can also be completed with his partner per consent form.   Kassadee Carawan S. [place], [place], MSW [facility]: Child and Adolescent  [phone]
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
## 2018-04-11 MED ORDER — DULOXETINE HCL 20 MG PO CPEP
40.0000 mg | ORAL_CAPSULE | Freq: Every day | ORAL | Status: DC
  Administered 2018-04-12: 40 mg via ORAL
  Filled 2018-04-11 (×3): qty 2

## 2018-04-11 NOTE — Progress Notes (Signed)
D: Patient observed visible in the milieu. Patient states his withdrawal is decreasing and his anxiety is more manageable. Reports having not slept x 2 weeks PTA but trazadone has resolved his insomnia. Patient is pleasant and cooperative. Patient's affect anxious, depressed but brightens with interaction. Mood congruent. Denies pain, physical complaints and CIWA is a "1".   A: Medicated per orders, prn trazadone given for sleep. Medication education provided. Level III obs in place for safety. Emotional support offered. Patient encouraged to complete Suicide Safety Plan before discharge. Encouraged to attend and participate in unit programming.    R: Patient verbalizes understanding of POC. On reassess, patient is asleep. Patient denies SI/HI/AVH and remains safe on level III obs. Will continue to monitor throughout the night.

## 2018-04-11 NOTE — Tx Team (Signed)
Interdisciplinary Treatment and Diagnostic Plan Update  04/11/2018 Time of Session: 9:00am Tyler Gross MRN: [id number]  Principal Diagnosis: <principal problem not specified>  Secondary Diagnoses: Active Problems:   Severe recurrent major depression without psychotic features (HCC)   Current Medications:  Current Facility-Administered Medications  Medication Dose Route Frequency Provider Last Rate Last Dose  . acetaminophen (TYLENOL) tablet 650 mg  650 mg Oral Q6H PRN Lindon Romp A, NP      . alum & mag hydroxide-simeth (MAALOX/MYLANTA) 200-200-20 MG/5ML suspension 30 mL  30 mL Oral Q4H PRN Lindon Romp A, NP      . amLODipine (NORVASC) tablet 5 mg  5 mg Oral Daily Cobos, Myer Peer, MD   5 mg at 04/11/18 0820  . atenolol (TENORMIN) tablet 100 mg  100 mg Oral Daily Cobos, Myer Peer, MD   100 mg at 04/11/18 0820  . busPIRone (BUSPAR) tablet 10 mg  10 mg Oral BID Lindon Romp A, NP   10 mg at 04/11/18 0820  . chlordiazePOXIDE (LIBRIUM) capsule 25 mg  25 mg Oral Q6H PRN Cobos, Fernando A, MD      . chlordiazePOXIDE (LIBRIUM) capsule 25 mg  25 mg Oral TID Cobos, Myer Peer, MD       Followed by  . [START ON 04/12/2018] chlordiazePOXIDE (LIBRIUM) capsule 25 mg  25 mg Oral BH-qamhs Cobos, Myer Peer, MD       Followed by  . [START ON 04/14/2018] chlordiazePOXIDE (LIBRIUM) capsule 25 mg  25 mg Oral Daily Cobos, Fernando A, MD      . DULoxetine (CYMBALTA) DR capsule 30 mg  30 mg Oral Daily Cobos, Myer Peer, MD   30 mg at 04/11/18 0821  . gemfibrozil (LOPID) tablet 600 mg  600 mg Oral BID AC Lindon Romp A, NP   600 mg at 04/11/18 4098  . hydrochlorothiazide (MICROZIDE) capsule 12.5 mg  12.5 mg Oral Daily Cobos, Fernando A, MD   12.5 mg at 04/11/18 0820  . hydrOXYzine (ATARAX/VISTARIL) tablet 25 mg  25 mg Oral Q6H PRN Cobos, Fernando A, MD      . lisinopril (PRINIVIL,ZESTRIL) tablet 20 mg  20 mg Oral Daily Cobos, Myer Peer, MD   20 mg at 04/11/18 0819  . loperamide (IMODIUM) capsule 2-4 mg   2-4 mg Oral PRN Cobos, Myer Peer, MD      . magnesium hydroxide (MILK OF MAGNESIA) suspension 30 mL  30 mL Oral Daily PRN Lindon Romp A, NP      . multivitamin with minerals tablet 1 tablet  1 tablet Oral Daily Cobos, Myer Peer, MD   1 tablet at 04/11/18 0820  . ondansetron (ZOFRAN-ODT) disintegrating tablet 4 mg  4 mg Oral Q6H PRN Cobos, Myer Peer, MD      . pantoprazole (PROTONIX) EC tablet 40 mg  40 mg Oral Daily Cobos, Myer Peer, MD   40 mg at 04/11/18 0820  . rosuvastatin (CRESTOR) tablet 10 mg  10 mg Oral QHS Lindon Romp A, NP   10 mg at 04/10/18 2244  . thiamine (B-1) injection 100 mg  100 mg Intramuscular Once Cobos, Fernando A, MD      . thiamine (VITAMIN B-1) tablet 100 mg  100 mg Oral Daily Cobos, Myer Peer, MD   100 mg at 04/11/18 0821  . traZODone (DESYREL) tablet 50 mg  50 mg Oral QHS PRN Lindon Romp A, NP   50 mg at 04/10/18 2244   PTA Medications: Medications Prior to Admission  Medication Sig  Dispense Refill Last Dose  . amLODipine (NORVASC) 5 MG tablet Take 1 tablet (5 mg total) by mouth daily. (Patient taking differently: Take 5 mg by mouth every morning. ) 7 tablet 0 04/09/2018 at Unknown time  . anastrozole (ARIMIDEX) 1 MG tablet Take 1 mg by mouth every morning.    04/09/2018 at Unknown time  . aspirin (ASPIRIN LOW DOSE) 81 MG tablet Take 81 mg by mouth every morning.    04/09/2018 at Unknown time  . atenolol (TENORMIN) 100 MG tablet Take 1 tablet (100 mg total) by mouth daily. (Patient taking differently: Take 100 mg by mouth every morning. ) 7 tablet 0 04/09/2018 at Unknown time  . buPROPion (WELLBUTRIN XL) 150 MG 24 hr tablet Take 1 tablet (150 mg total) by mouth daily. 90 tablet 3 04/09/2018 at Unknown time  . busPIRone (BUSPAR) 10 MG tablet Take 1 tablet (10 mg total) by mouth daily. (Patient taking differently: Take 10 mg by mouth 2 (two) times daily. ) 7 tablet 0 04/09/2018 at Unknown time  . Cholecalciferol (VITAMIN D3 PO) Take by mouth daily.    04/09/2018 at Unknown time   . clomiPHENE (CLOMID) 50 MG tablet Take 0.5 tablets (25 mg total) by mouth daily. (Patient taking differently: Take 25 mg by mouth every Monday, Wednesday, and Friday. ) 15 tablet 11 04/09/2018 at Unknown time  . Cranberry Extract 250 MG TABS Take 1 tablet by mouth daily.    04/09/2018 at Unknown time  . gemfibrozil (LOPID) 600 MG tablet Take 1 tablet (600 mg total) by mouth daily. (Patient taking differently: Take 600 mg by mouth 2 (two) times daily before a meal. ) 7 tablet 0 04/09/2018 at Unknown time  . lisinopril-hydrochlorothiazide (PRINZIDE,ZESTORETIC) 20-12.5 MG tablet Take 1 tablet by mouth daily. (Patient taking differently: Take 1 tablet by mouth every morning. ) 7 tablet 0 04/09/2018 at Unknown time  . Multiple Vitamin (MULTIVITAMIN WITH MINERALS) TABS tablet Take 1 tablet by mouth daily.   04/09/2018 at Unknown time  . mupirocin ointment (BACTROBAN) 2 % Place 1 application into the nose 2 (two) times daily. (Patient not taking: Reported on 04/09/2018) 22 g 0 Not Taking at Unknown time  . Omega-3 Fatty Acids (FISH OIL PO) Take 2,000 mg by mouth 2 (two) times daily.    04/09/2018 at Unknown time  . pantoprazole (PROTONIX) 40 MG tablet Take 1 tablet (40 mg total) by mouth daily. (Patient taking differently: Take 40 mg by mouth every morning. ) 7 tablet 0 04/09/2018 at Unknown time  . potassium chloride (K-DUR,KLOR-CON) 10 MEQ tablet Take 1 tablet (10 mEq total) by mouth daily. (Patient taking differently: Take 10 mEq by mouth every morning. ) 7 tablet 0 04/09/2018 at Unknown time  . rosuvastatin (CRESTOR) 10 MG tablet Take 1 tablet (10 mg total) by mouth at bedtime. TAKE ONE TABLET BY MOUTH NIGHTLY AT BEDTIME 90 tablet 1 04/08/2018 at Unknown time    Patient Stressors: Occupational concerns Substance abuse  Patient Strengths: Ability for insight Average or above average intelligence Capable of independent living [employer] of knowledge Motivation for treatment/growth  Treatment Modalities: Medication  Management, Group therapy, Case management,  1 to 1 session with clinician, Psychoeducation, Recreational therapy.   Physician Treatment Plan for Primary Diagnosis: <principal problem not specified> Long Term Goal(s): Improvement in symptoms so as ready for discharge Improvement in symptoms so as ready for discharge   Short Term Goals: Ability to identify changes in lifestyle to reduce recurrence of condition will improve  Ability to maintain clinical measurements within normal limits will improve Ability to identify triggers associated with substance abuse/mental health issues will improve  Medication Management: Evaluate patient's response, side effects, and tolerance of medication regimen.  Therapeutic Interventions: 1 to 1 sessions, Unit Group sessions and Medication administration.  Evaluation of Outcomes: Not Met  Physician Treatment Plan for Secondary Diagnosis: Active Problems:   Severe recurrent major depression without psychotic features ([place])  Long Term Goal(s): Improvement in symptoms so as ready for discharge Improvement in symptoms so as ready for discharge   Short Term Goals: Ability to identify changes in lifestyle to reduce recurrence of condition will improve Ability to maintain clinical measurements within normal limits will improve Ability to identify triggers associated with substance abuse/mental health issues will improve     Medication Management: Evaluate patient's response, side effects, and tolerance of medication regimen.  Therapeutic Interventions: 1 to 1 sessions, Unit Group sessions and Medication administration.  Evaluation of Outcomes: Not Met   RN Treatment Plan for Primary Diagnosis: <principal problem not specified> Long Term Goal(s): Knowledge of disease and therapeutic regimen to maintain health will improve  Short Term Goals: Ability to participate in decision making will improve, Ability to disclose and discuss suicidal ideas, Ability to  identify and develop effective coping behaviors will improve and Compliance with prescribed medications will improve  Medication Management: RN will administer medications as ordered by provider, will assess and evaluate patient's response and provide education to patient for prescribed medication. RN will report any adverse and/or side effects to prescribing provider.  Therapeutic Interventions: 1 on 1 counseling sessions, Psychoeducation, Medication administration, Evaluate responses to treatment, Monitor vital signs and CBGs as ordered, Perform/monitor CIWA, COWS, AIMS and Fall Risk screenings as ordered, Perform wound care treatments as ordered.  Evaluation of Outcomes: Not Met   LCSW Treatment Plan for Primary Diagnosis: <principal problem not specified> Long Term Goal(s): Safe transition to appropriate next level of care at discharge, Engage patient in therapeutic group addressing interpersonal concerns.  Short Term Goals: Engage patient in aftercare planning with referrals and resources and Increase skills for wellness and recovery  Therapeutic Interventions: Assess for all discharge needs, 1 to 1 time with Social worker, Explore available resources and support systems, Assess for adequacy in community support network, Educate family and significant other(s) on suicide prevention, Complete Psychosocial Assessment, Interpersonal group therapy.  Evaluation of Outcomes: Not Met   Progress in Treatment: Attending groups: Yes. Participating in groups: Yes. Taking medication as prescribed: Yes. Toleration medication: Yes. Family/Significant other contact made: No, will contact:  patient's significant other Patient understands diagnosis: Yes. Discussing patient identified problems/goals with staff: Yes. Medical problems stabilized or resolved: Yes. Denies suicidal/homicidal ideation: Yes. Issues/concerns per patient self-inventory: No. Other:   New problem(s) identified: None  New  Short Term/Long Term Goal(s): medication stabilization, elimination of SI thoughts, development of comprehensive mental wellness plan.   Patient Goals:  I need help with my mood and self-esteem.   Discharge Plan or Barriers: CSW will assess for an appropriate discharge plan.   Reason for Continuation of Hospitalization: Anxiety Depression Medication stabilization Suicidal ideation  Estimated Length of Stay: 3-5 days   Attendees: Patient: 04/11/2018 8:43 AM  Physician: Dr. Neita Garnet, MD 04/11/2018 8:43 AM  Nursing: Barbie Banner, RN  04/11/2018 8:43 AM  RN Care Manager: Rhunette Croft 04/11/2018 8:43 AM  Social Worker: Radonna Ricker, [place] 04/11/2018 8:43 AM  Recreational Therapist: Rhunette Croft 04/11/2018 8:43 AM  Other: Rhunette Croft 04/11/2018 8:43 AM  Other: Rhunette Croft  04/11/2018 8:43 AM  Other:X 04/11/2018 8:43 AM    Scribe for Treatment Team: Marylee Floras, [place] 04/11/2018 8:43 AM

## 2018-04-11 NOTE — [facility] Suicide Risk Assessment (Signed)
[place] INPATIENT:  Family/Significant Other Suicide Prevention Education  Suicide Prevention Education:  Education Completed; Hans [place], [place], [place] [phone] has been identified by the patient as the family member/significant other with whom the patient will be residing, and identified as the person(s) who will aid the patient in the event of a mental health crisis (suicidal ideations/suicide attempt).  With written consent from the patient, the family member/significant other has been provided the following suicide prevention education, prior to the and/or following the discharge of the patient.  The suicide prevention education provided includes the following:  Suicide risk factors  Suicide prevention and interventions  National Suicide Hotline telephone number  [facility] assessment telephone number  [facility] Emergency Assistance [place] and/or Residential Mobile Crisis Unit telephone number  Request made of family/significant other to:  Remove weapons (e.g., guns, rifles, knives), all items previously/currently identified as safety concern.    Remove drugs/medications (over-the-counter, prescriptions, illicit drugs), all items previously/currently identified as a safety concern.  The family member/significant other verbalizes understanding of the suicide prevention education information provided.  The family member/significant other agrees to remove the items of safety concern listed above.  Trish Mage 04/11/2018, 3:50 PM

## 2018-04-11 NOTE — Progress Notes (Signed)
Patient ID: Tyler Gross, male   DOB: 07-03-1976, 42 y.o.   MRN: [id number]  Nursing Progress Note [id number]  Data: Patient presents with animated affect and pleasant mood. Patient reports a decrease in his withdrawal symptoms. Patient complaint with scheduled medications. Patient denies pain/physical complaints. Patient completed self-inventory sheet and rates depression, hopelessness, and anxiety 0,0,3 respectively. Patient rates their sleep and appetite as good/good respectively. Patient states goal for today is to "get ready to discharge". Patient is seen attending groups and visible in the milieu. Patient currently denies SI/HI/AVH. Patient reports he feels much better after getting some sleep and having the support of his partner and mom.  Action: Patient educated about and provided medication per provider's orders. Patient safety maintained with q15 min safety checks and frequent rounding. Low fall risk precautions in place. Emotional support given. 1:1 interaction and active listening provided. Patient encouraged to attend meals and groups. Patient encouraged to work on treatment plan and goals. Labs, vital signs and patient behavior monitored throughout shift.   Response: Patient remains safe on the unit at this time. Patient is interacting with peers appropriately on the unit. Will continue to support and monitor.

## 2018-04-11 NOTE — Plan of Care (Signed)
  Problem: Activity: Goal: Sleeping patterns will improve Outcome: Progressing Patient reports he has not slept x 2w PTA. States trazadone has been effective.   Problem: Education: Goal: Verbalization of understanding the information provided will improve Outcome: Completed/Met Patient verbalizes understanding of information, education provided.

## 2018-04-11 NOTE — Progress Notes (Signed)
Adult Psychoeducational Group Note  Date:  04/11/2018 Time:  9:41 AM  Group Topic/Focus:  Orientation:   The focus of this group is to educate the patient on the purpose and policies of crisis stabilization and provide a format to answer questions about their admission.  The group details unit policies and expectations of patients while admitted.  Participation Level:  Active  Participation Quality:  Appropriate  Affect:  Appropriate  Cognitive:  Alert  Insight: Appropriate  Engagement in Group:  Engaged  Modes of Intervention:  Discussion and Education  Additional Comments:     Lita Mains 04/11/2018, 9:41 AM

## 2018-04-11 NOTE — Progress Notes (Signed)
[facility] MD Progress Note  04/11/2018 12:31 PM Tyler Gross  MRN:  [id number] Subjective: Patient reports she is feeling "a lot better today", and describes improving mood and decreased level of anxiety.  States he had a good visit from his significant other and family yesterday evening and describes a positive social support system.  Today denies suicidal ideations. States "I really do feel that I was experiencing alcohol withdrawal", and expresses improving insight regarding "that I was  drinking too much". Denies medication side effects. Objective : I have discussed case with treatment team and have met with patient. 42 year old male, lives with SO, employed.  Presented due to worsening depression, severe anxiety, neurovegetative symptoms of depression, suicidal ideations, reported heavy/daily drinking.  Yesterday patient presented quite anxious/tremulous.  This is much improved today and although still vaguely anxious reports feeling "a lot better".  Subtle distal tremors noted but clearly much improved compared to yesterday.  Does not appear to be in any acute distress or discomfort at present.  Vitals are currently stable. As above, patient is expressing increased insight regarding alcohol use disorder and negative impact that alcohol has had on his physical health.  We have reviewed and using motivational/nonconfrontational techniques have reviewed benefits associated with abstinence. No disruptive or agitated behaviors on unit. Denies medication side effects. Principal Problem: MDD, GAD, Alcohol Use Disorder Diagnosis:   Patient Active Problem List   Diagnosis Date Noted  . Severe recurrent major depression without psychotic features ([place]) [F33.2] 04/09/2018  . Depressive disorder [F32.9] 01/24/2018  . Vitamin B deficiency [E53.9] 01/24/2018  . Skin ulceration, limited to breakdown of skin ([place]) [L98.491] 12/20/2017  . GAD (generalized anxiety disorder) [F41.1] 12/20/2017  . Testicular  discomfort [N50.819] 01/17/2017  . Obesity, unspecified [E66.9] 01/09/2017  . Elevated liver enzymes [R74.8] 11/08/2016  . Impaired fasting glucose [R73.01] 10/07/2016  . Potential exposure to STD [Z20.2] 07/11/2015  . Gynecomastia [N62] 01/06/2014  . ED (erectile dysfunction) [N52.9] 01/06/2014  . Estrogen excess [E28.0] 01/06/2014  . Fatigue [R53.83] 12/25/2013  . Hypogonadism male [E29.1] 06/25/2013  . OSA (obstructive sleep apnea) [G47.33] 05/19/2013  . Hypertriglyceridemia [E78.1] 04/14/2010  . Essential hypertension [I10] 04/14/2010  . PALPITATIONS [R00.2] 04/14/2010  . CHEST PAIN-UNSPECIFIED [R07.9] 04/14/2010   Total Time spent with patient: 20 minutes  Past Psychiatric History:   Past Medical History:  Past Medical History:  Diagnosis Date  . Anal lesion    nonhealing  . Depression   . ED (erectile dysfunction)   . GAD (generalized anxiety disorder)   . Gynecomastia, male    followed by dr Shellia Cleverly  . High serum estradiol   . History of kidney stones   . Hypertension   . Mixed hyperlipidemia   . OSA on CPAP    per last study 07-31-2013  severe osa  . Pre-diabetes   . Primary hypogonadism in male    endocrinologist-  dr Shellia Cleverly  . Wears glasses     Past Surgical History:  Procedure Laterality Date  . CARDIOVASCULAR STRESS TEST  10/05/2009   normal nuclear study w/ no ischemia/  normal LV function and wall motion , ef 71%  . COLONOSCOPY  last one 2004  . EVALUATION UNDER ANESTHESIA WITH ANAL FISTULECTOMY N/A 02/13/2018   Procedure: ANAL EXAM UNDER ANESTHESIA WITH BIOPSY;  Surgeon: [name], MD;  Location: [facility];  Service: General;  Laterality: N/A;  . ORCHIECTOMY Left 1990   w/ placement prosthesis (for torsion)  . URETEROLITHOTOMY  1999  Family History:  Family History  Problem Relation Age of Onset  . Cancer Mother        breast cancer: stage I  . Hypertension Mother    Family Psychiatric  History:  Social History:  Social  History   Substance and Sexual Activity  Alcohol Use Yes   Comment: Daily     Social History   Substance and Sexual Activity  Drug Use No    Social History   Socioeconomic History  . Marital status: Single    Spouse name: Not on file  . Number of children: Not on file  . Years of education: Not on file  . Highest education level: Not on file  Occupational History  . Not on file  Social Needs  . Financial resource strain: Not on file  . Food insecurity:    Worry: Not on file    Inability: Not on file  . Transportation needs:    Medical: Not on file    Non-medical: Not on file  Tobacco Use  . Smoking status: Former Smoker    Years: 10.00    Types: Cigarettes    Last attempt to quit: 02/07/2009    Years since quitting: 9.1  . Smokeless tobacco: Never Used  Substance and Sexual Activity  . Alcohol use: Yes    Comment: Daily  . Drug use: No  . Sexual activity: Yes    Partners: Male  Lifestyle  . Physical activity:    Days per week: Not on file    Minutes per session: Not on file  . Stress: Not on file  Relationships  . Social connections:    Talks on phone: Not on file    Gets together: Not on file    Attends religious service: Not on file    Active member of club or organization: Not on file    Attends meetings of clubs or organizations: Not on file    Relationship status: Not on file  Other Topics Concern  . Not on file  Social History Narrative   Lives with partner   Regular exercise: no   Caffeine use: 1 large cup of coffee daily; 2 to 3 sodas in the evening   Additional Social History:   Sleep: Fair-attributes disrupted sleep to environmental noise/roommate snoring  Appetite:  Improving  Current Medications: Current Facility-Administered Medications  Medication Dose Route Frequency Provider Last Rate Last Dose  . acetaminophen (TYLENOL) tablet 650 mg  650 mg Oral Q6H PRN Lindon Romp A, NP      . alum & mag hydroxide-simeth (MAALOX/MYLANTA)  200-200-20 MG/5ML suspension 30 mL  30 mL Oral Q4H PRN Lindon Romp A, NP      . amLODipine (NORVASC) tablet 5 mg  5 mg Oral Daily Cobos, Myer Peer, MD   5 mg at 04/11/18 0820  . atenolol (TENORMIN) tablet 100 mg  100 mg Oral Daily Cobos, Myer Peer, MD   100 mg at 04/11/18 0820  . busPIRone (BUSPAR) tablet 10 mg  10 mg Oral BID Lindon Romp A, NP   10 mg at 04/11/18 0820  . chlordiazePOXIDE (LIBRIUM) capsule 25 mg  25 mg Oral Q6H PRN Cobos, Fernando A, MD      . chlordiazePOXIDE (LIBRIUM) capsule 25 mg  25 mg Oral TID Cobos, Myer Peer, MD       Followed by  . [START ON 04/12/2018] chlordiazePOXIDE (LIBRIUM) capsule 25 mg  25 mg Oral BH-qamhs Cobos, Myer Peer, MD  Followed by  . [START ON 04/14/2018] chlordiazePOXIDE (LIBRIUM) capsule 25 mg  25 mg Oral Daily Cobos, Fernando A, MD      . DULoxetine (CYMBALTA) DR capsule 30 mg  30 mg Oral Daily Cobos, Myer Peer, MD   30 mg at 04/11/18 0821  . gemfibrozil (LOPID) tablet 600 mg  600 mg Oral BID AC Lindon Romp A, NP   600 mg at 04/11/18 1443  . hydrochlorothiazide (MICROZIDE) capsule 12.5 mg  12.5 mg Oral Daily Cobos, Fernando A, MD   12.5 mg at 04/11/18 0820  . hydrOXYzine (ATARAX/VISTARIL) tablet 25 mg  25 mg Oral Q6H PRN Cobos, Fernando A, MD      . lisinopril (PRINIVIL,ZESTRIL) tablet 20 mg  20 mg Oral Daily Cobos, Myer Peer, MD   20 mg at 04/11/18 0819  . loperamide (IMODIUM) capsule 2-4 mg  2-4 mg Oral PRN Cobos, Myer Peer, MD      . magnesium hydroxide (MILK OF MAGNESIA) suspension 30 mL  30 mL Oral Daily PRN Lindon Romp A, NP      . multivitamin with minerals tablet 1 tablet  1 tablet Oral Daily Cobos, Myer Peer, MD   1 tablet at 04/11/18 0820  . ondansetron (ZOFRAN-ODT) disintegrating tablet 4 mg  4 mg Oral Q6H PRN Cobos, Myer Peer, MD      . pantoprazole (PROTONIX) EC tablet 40 mg  40 mg Oral Daily Cobos, Myer Peer, MD   40 mg at 04/11/18 0820  . rosuvastatin (CRESTOR) tablet 10 mg  10 mg Oral QHS Lindon Romp A, NP   10 mg at  04/10/18 2244  . thiamine (B-1) injection 100 mg  100 mg Intramuscular Once Cobos, Fernando A, MD      . thiamine (VITAMIN B-1) tablet 100 mg  100 mg Oral Daily Cobos, Myer Peer, MD   100 mg at 04/11/18 0821  . traZODone (DESYREL) tablet 50 mg  50 mg Oral QHS PRN Rozetta Nunnery, NP   50 mg at 04/10/18 2244    Lab Results:  Results for orders placed or performed during the hospital encounter of 04/09/18 (from the past 48 hour(s))  Hemoglobin A1c     Status: Abnormal   Collection Time: 04/10/18  6:45 AM  Result Value Ref Range   Hgb A1c MFr Bld 6.3 (H) 4.8 - 5.6 %    Comment: (NOTE) Pre diabetes:          5.7%-6.4% Diabetes:              >6.4% Glycemic control for   <7.0% adults with diabetes    Mean Plasma Glucose 134.11 mg/dL    Comment: Performed at [facility], [place] [address], [place], [place] [address]  Lipid panel     Status: Abnormal   Collection Time: 04/10/18  6:45 AM  Result Value Ref Range   Cholesterol 191 0 - 200 mg/dL   Triglycerides 461 (H) <150 mg/dL   HDL 31 (L) >40 mg/dL   Total CHOL/HDL Ratio 6.2 RATIO   VLDL UNABLE TO CALCULATE IF TRIGLYCERIDE OVER 400 mg/dL 0 - 40 mg/dL   LDL Cholesterol UNABLE TO CALCULATE IF TRIGLYCERIDE OVER 400 mg/dL 0 - 99 mg/dL    Comment:        Total Cholesterol/HDL:CHD Risk Coronary Heart Disease Risk Table                     Men   Women  1/2 Average Risk  3.4   3.3  Average Risk       5.0   4.4  2 X Average Risk   9.6   7.1  3 X Average Risk  23.4   11.0        Use the calculated Patient Ratio above and the CHD Risk Table to determine the patient's CHD Risk.        ATP III CLASSIFICATION (LDL):  <100     mg/dL   Optimal  100-129  mg/dL   Near or Above                    Optimal  130-159  mg/dL   Borderline  160-189  mg/dL   High  >190     mg/dL   Very High Performed at [facility] [address] [place], [place] [address]   TSH     Status: None   Collection Time: 04/10/18  6:45 AM   Result Value Ref Range   TSH 3.046 0.350 - 4.500 uIU/mL    Comment: Performed by a 3rd Generation assay with a functional sensitivity of <=0.01 uIU/mL. Performed at [facility], [place] [address], [place], [place] [address]     Blood Alcohol level:  Lab Results  Component Value Date   ETH <10 02/63/7858    Metabolic Disorder Labs: Lab Results  Component Value Date   HGBA1C 6.3 (H) 04/10/2018   MPG 134.11 04/10/2018   No results found for: PROLACTIN Lab Results  Component Value Date   CHOL 191 04/10/2018   TRIG 461 (H) 04/10/2018   HDL 31 (L) 04/10/2018   CHOLHDL 6.2 04/10/2018   VLDL UNABLE TO CALCULATE IF TRIGLYCERIDE OVER 400 mg/dL 04/10/2018   LDLCALC UNABLE TO CALCULATE IF TRIGLYCERIDE OVER 400 mg/dL 04/10/2018   LDLCALC  10/04/2009    39        Total Cholesterol/HDL:CHD Risk Coronary Heart Disease Risk Table                     Men   Women  1/2 Average Risk   3.4   3.3  Average Risk       5.0   4.4  2 X Average Risk   9.6   7.1  3 X Average Risk  23.4   11.0        Use the calculated Patient Ratio above and the CHD Risk Table to determine the patient's CHD Risk.        ATP III CLASSIFICATION (LDL):  <100     mg/dL   Optimal  100-129  mg/dL   Near or Above                    Optimal  130-159  mg/dL   Borderline  160-189  mg/dL   High  >190     mg/dL   Very High    Physical Findings: AIMS: Facial and Oral Movements Muscles of Facial Expression: None, normal Lips and Perioral Area: None, normal Jaw: None, normal Tongue: None, normal,Extremity Movements Upper (arms, wrists, hands, fingers): None, normal Lower (legs, knees, ankles, toes): None, normal, Trunk Movements Neck, shoulders, hips: None, normal, Overall Severity Severity of abnormal movements (highest score from questions above): None, normal Incapacitation due to abnormal movements: None, normal Patient's awareness of abnormal movements (rate only patient's report): No  Awareness, Dental Status Current problems with teeth and/or dentures?: No Does patient usually wear dentures?: No  CIWA:  CIWA-Ar Total: 3 COWS:     Musculoskeletal: Strength & Muscle Tone: within normal limits-mild distal tremors, no psychomotor agitation or restlessness Gait & Station: normal Patient leans: N/A  Psychiatric Specialty Exam: Physical Exam  ROS no chest pain, no shortness of breath, no vomiting, no fever  Blood pressure 127/79, pulse 69, temperature 98.3 F (36.8 C), temperature source Oral, resp. rate 20, height 6' (1.829 m), weight 120.2 kg.Body mass index is 35.94 kg/m.  General Appearance: Improving grooming  Eye Contact:  Good  Speech:  Normal Rate  Volume:  Normal  Mood:  Improving mood, states he feels better today  Affect:  More reactive, less anxious  Thought Process:  Linear and Descriptions of Associations: Intact  Orientation:  Other:  Fully alert and attentive  Thought Content:  No hallucinations, no delusions expressed  Suicidal Thoughts:  No today denies any suicidal ideations, no homicidal ideations, contracts for safety on unit  Homicidal Thoughts:  No  Memory:  Recent and remote grossly intact  Judgement:  Other:  Improving  Insight:  Improving  Psychomotor Activity:  Normal  Concentration:  Concentration: Good and Attention Span: Good  Recall:  Good  Fund of Knowledge:  Good  Language:  Good  Akathisia:  Negative  Handed:  Right  AIMS (if indicated):     Assets:  Desire for Improvement Resilience Social Support Others:  Employed  ADL's:  Intact  Cognition:  WNL  Sleep:  Number of Hours: 6   Assessment -42 year old male, lives with partner, employed, presented due to worsening depression, neurovegetative symptoms, severe anxiety, suicidal ideations.  Reported daily drinking, often more than a bottle of wine per day.  Yesterday presented severely anxious ,restless ,vaguely agitated.  Today much improved, calmer, no psychomotor agitation.   Vitals stable.  Treatment Plan Summary: Daily contact with patient to assess and evaluate symptoms and progress in treatment, Medication management, Plan inpatient admission and medications as below\  Encourage group and milieu participation to work on coping skills Encourage efforts to work on sobriety/abstinence Continue Librium detox protocol to minimize symptoms of alcohol withdrawal Continue BuSpar 10 mg twice a day for anxiety Increase Cymbalta to 40 mgrs a day for anxiety, depression Continue Trazodone 50 mg nightly PRN for insomnia Continue antihypertensive management-Lisinopril, HCTZ, Tenormin, Norvasc Treatment team working on disposition Ward, MD 04/11/2018, 12:31 PM

## 2018-04-12 DIAGNOSIS — F1021 Alcohol dependence, in remission: Secondary | ICD-10-CM

## 2018-04-12 DIAGNOSIS — F1024 Alcohol dependence with alcohol-induced mood disorder: Secondary | ICD-10-CM

## 2018-04-12 MED ORDER — HYDROXYZINE HCL 25 MG PO TABS: 25.0000 mg | ORAL_TABLET | Freq: Four times a day (QID) | ORAL | 0 refills | Status: DC | PRN

## 2018-04-12 MED ORDER — TRAZODONE HCL 50 MG PO TABS: 50.0000 mg | ORAL_TABLET | Freq: Every evening | ORAL | 0 refills | Status: DC | PRN

## 2018-04-12 MED ORDER — DULOXETINE HCL 60 MG PO CPEP
60.0000 mg | ORAL_CAPSULE | Freq: Every day | ORAL | Status: DC
  Filled 2018-04-12 (×2): qty 1

## 2018-04-12 MED ORDER — DULOXETINE HCL 60 MG PO CPEP
60.0000 mg | ORAL_CAPSULE | Freq: Every day | ORAL | 2 refills | Status: DC
  Filled 2022-06-21: qty 30, 30d supply, fill #0
  Filled 2022-07-14 – 2022-07-16 (×2): qty 30, 30d supply, fill #1

## 2018-04-12 NOTE — Plan of Care (Signed)
Discharge Note  Patient verbalizes readiness for discharge. Follow up plan explained, AVS, Transition record and SRA given. Prescriptions and teaching provided. Belongings returned and signed for. Suicide safety plan completed and signed. Patient verbalizes understanding. Patient denies SI/HI and assures this Probation officer he will seek assistance should that change. Patient discharged to lobby where family was waiting for him.  Problem: Education: Goal: Knowledge of [facility] Education information/materials will improve Outcome: Adequate for Discharge Goal: Emotional status will improve Outcome: Adequate for Discharge Goal: Mental status will improve Outcome: Adequate for Discharge   Problem: Activity: Goal: Interest or engagement in activities will improve Outcome: Adequate for Discharge Goal: Sleeping patterns will improve Outcome: Adequate for Discharge   Problem: Coping: Goal: Ability to verbalize frustrations and anger appropriately will improve Outcome: Adequate for Discharge Goal: Ability to demonstrate self-control will improve Outcome: Adequate for Discharge   Problem: Health Behavior/Discharge Planning: Goal: Identification of resources available to assist in meeting health care needs will improve Outcome: Adequate for Discharge Goal: Compliance with treatment plan for underlying cause of condition will improve Outcome: Adequate for Discharge   Problem: Physical Regulation: Goal: Ability to maintain clinical measurements within normal limits will improve Outcome: Adequate for Discharge   Problem: Safety: Goal: Periods of time without injury will increase Outcome: Adequate for Discharge   Problem: Education: Goal: Utilization of techniques to improve thought processes will improve Outcome: Adequate for Discharge Goal: Knowledge of the prescribed therapeutic regimen will improve Outcome: Adequate for Discharge   Problem: Activity: Goal: Interest or engagement  in leisure activities will improve Outcome: Adequate for Discharge Goal: Imbalance in normal sleep/wake cycle will improve Outcome: Adequate for Discharge   Problem: Coping: Goal: Coping ability will improve Outcome: Adequate for Discharge Goal: Will verbalize feelings Outcome: Adequate for Discharge   Problem: Health Behavior/Discharge Planning: Goal: Ability to make decisions will improve Outcome: Adequate for Discharge Goal: Compliance with therapeutic regimen will improve Outcome: Adequate for Discharge   Problem: Role Relationship: Goal: Will demonstrate positive changes in social behaviors and relationships Outcome: Adequate for Discharge   Problem: Safety: Goal: Ability to disclose and discuss suicidal ideas will improve Outcome: Adequate for Discharge Goal: Ability to identify and utilize support systems that promote safety will improve Outcome: Adequate for Discharge   Problem: Self-Concept: Goal: Will verbalize positive feelings about self Outcome: Adequate for Discharge Goal: Level of anxiety will decrease Outcome: Adequate for Discharge   Problem: Education: Goal: Ability to make informed decisions regarding treatment will improve Outcome: Adequate for Discharge   Problem: Coping: Goal: Coping ability will improve Outcome: Adequate for Discharge   Problem: Health Behavior/Discharge Planning: Goal: Identification of resources available to assist in meeting health care needs will improve Outcome: Adequate for Discharge   Problem: Medication: Goal: Compliance with prescribed medication regimen will improve Outcome: Adequate for Discharge   Problem: Self-Concept: Goal: Ability to disclose and discuss suicidal ideas will improve Outcome: Adequate for Discharge Goal: Will verbalize positive feelings about self Outcome: Adequate for Discharge   Problem: Education: Goal: Knowledge of disease or condition will improve Outcome: Adequate for Discharge Goal:  Understanding of discharge needs will improve Outcome: Adequate for Discharge   Problem: Health Behavior/Discharge Planning: Goal: Ability to identify changes in lifestyle to reduce recurrence of condition will improve Outcome: Adequate for Discharge Goal: Identification of resources available to assist in meeting health care needs will improve Outcome: Adequate for Discharge   Problem: Physical Regulation: Goal: Complications related to the disease process, condition or treatment will  be avoided or minimized Outcome: Adequate for Discharge   Problem: Safety: Goal: Ability to remain free from injury will improve Outcome: Adequate for Discharge   Problem: Education: Goal: Knowledge of General Education information will improve Description Including pain rating scale, medication(s)/side effects and non-pharmacologic comfort measures Outcome: Adequate for Discharge   Problem: Health Behavior/Discharge Planning: Goal: Ability to manage health-related needs will improve Outcome: Adequate for Discharge   Problem: Coping: Goal: Level of anxiety will decrease Outcome: Adequate for Discharge   Problem: Safety: Goal: Ability to remain free from injury will improve Outcome: Adequate for Discharge

## 2018-04-12 NOTE — [facility] Group Notes (Signed)
[facility] Group Notes:  (Nursing/MHT/Case Management/Adjunct)  Date:  04/12/2018  Time:  1:15 PM  Type of Therapy:  Nurse Education  Participation Level:  Active  Participation Quality:  Appropriate  Affect:  Appropriate  Cognitive:  Appropriate  Insight:  Appropriate  Engagement in Group:  Engaged  Modes of Intervention:  Discussion and Education  Summary of Progress/Problems:Nurse led group: Life Skills/Identifying Needs   Baron Sane 04/12/2018, 3:41 PM

## 2018-04-12 NOTE — [facility] Group Notes (Signed)
LCSW Group Therapy Note  04/12/2018    10:00-11:00am   Type of Therapy and Topic:  Group Therapy: Anger and Coping Skills  Participation Level:  Active   Description of Group:   In this group, patients learned how to recognize the physical, cognitive, emotional, and behavioral responses they have to anger-provoking situations.  They identified how they usually or often react when angered, and learned how healthy and unhealthy coping skills work initially, but the unhealthy ones stop working.   They analyzed how their frequently-chosen coping skill is possibly beneficial and how it is possibly unhelpful.  The group discussed a variety of healthier coping skills that could help in resolving the actual issues, as well as how to go about planning for the the possibility of future similar situations.  Therapeutic Goals: 1. Patients will identify one thing that makes them angry and how they feel emotionally and physically, what their thoughts are or tend to be in those situations, and what healthy or unhealthy coping mechanism they typically use 2. Patients will identify how their coping technique works for them, as well as how it works against them. 3. Patients will explore possible new behaviors to use in future anger situations. 4. Patients will learn that anger itself is normal and cannot be eliminated, and that healthier coping skills can assist with resolving conflict rather than worsening situations.  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  Therapeutic Modalities:   Cognitive Behavioral Therapy Motivation Interviewing  Maretta Los  .

## 2018-04-12 NOTE — Progress Notes (Signed)
  [facility] Adult Case Management Discharge Plan :  Will you be returning to the same living situation after discharge:  Yes,  with significant other At discharge, do you have transportation home?: Yes,  significant other Do you have the ability to pay for your medications: Yes,  denies having any barriers  Release of information consent forms completed and in the chart;  Patient's signature needed at discharge.  Patient to Follow up at: Follow-up [facility] Follow up on 04/17/2018.   Why:  Thursday at [facility] with [facility], therapist.  Then 9/18, Wednesday, at 4:30 with Rosezella Rumpf for medication management. Please call them on Monday to confirm appointments, confirm insurance informatyion and pay $20.00 deposit that will go towards copay Contact information: [place] [address] [phone]           Next level of care provider has access to [place] and Suicide Prevention discussed: Yes,  with patient and significant other  Have you used any form of tobacco in the last 30 days? (Cigarettes, Smokeless Tobacco, Cigars, and/or Pipes): No  Has patient been referred to the Quitline?: N/A patient is not a smoker  Patient has been referred for addiction treatment: Yes  Maretta Los, LCSW 04/12/2018, 9:08 AM

## 2018-04-12 NOTE — Discharge Summary (Addendum)
Physician Discharge Summary Note  Patient:  Tyler Gross is an 42 y.o., male MRN:  [id number] DOB:  27-Sep-1975 Patient phone:  [phone] (home)  Patient address:   [place]  [place] [address],  Total Time spent with patient: 20 minutes  Date of Admission:  04/09/2018 Date of Discharge: 04/12/18  Reason for Admission:  ETOH abuse with worsening depression with SI  Principal Problem: Severe recurrent major depression without psychotic features [facility]) Discharge Diagnoses: Patient Active Problem List   Diagnosis Date Noted  . Severe recurrent major depression without psychotic features ([place]) [F33.2] 04/09/2018  . Depressive disorder [F32.9] 01/24/2018  . Vitamin B deficiency [E53.9] 01/24/2018  . Skin ulceration, limited to breakdown of skin ([place]) [L98.491] 12/20/2017  . GAD (generalized anxiety disorder) [F41.1] 12/20/2017  . Testicular discomfort [N50.819] 01/17/2017  . Obesity, unspecified [E66.9] 01/09/2017  . Elevated liver enzymes [R74.8] 11/08/2016  . Impaired fasting glucose [R73.01] 10/07/2016  . Potential exposure to STD [Z20.2] 07/11/2015  . Gynecomastia [N62] 01/06/2014  . ED (erectile dysfunction) [N52.9] 01/06/2014  . Estrogen excess [E28.0] 01/06/2014  . Fatigue [R53.83] 12/25/2013  . Hypogonadism male [E29.1] 06/25/2013  . OSA (obstructive sleep apnea) [G47.33] 05/19/2013  . Hypertriglyceridemia [E78.1] 04/14/2010  . Essential hypertension [I10] 04/14/2010  . PALPITATIONS [R00.2] 04/14/2010  . CHEST PAIN-UNSPECIFIED [R07.9] 04/14/2010    Past Psychiatric History: No prior psychiatric admissions . Reports long history of depression and anxiety Denies prior history of suicide attempts or of self cutting.Marland Kitchen Describes depression as chronic, but worsening over recent months to weeks. Describes anxiety as a tendency to worry excessively and severely. States he has been diagnosed with GAD in the past . Denies history of psychosis, denies history of  mania , denies history of violence . States he has been on Wellbutrin for years,states does was recently increased by outpatient provider , but did not tolerate higher dose well because of palpitations, increased BP.  Past Medical History:  Past Medical History:  Diagnosis Date  . Anal lesion    nonhealing  . Depression   . ED (erectile dysfunction)   . GAD (generalized anxiety disorder)   . Gynecomastia, male    followed by dr Shellia Cleverly  . High serum estradiol   . History of kidney stones   . Hypertension   . Mixed hyperlipidemia   . OSA on CPAP    per last study 07-31-2013  severe osa  . Pre-diabetes   . Primary hypogonadism in male    endocrinologist-  dr Shellia Cleverly  . Wears glasses     Past Surgical History:  Procedure Laterality Date  . CARDIOVASCULAR STRESS TEST  10/05/2009   normal nuclear study w/ no ischemia/  normal LV function and wall motion , ef 71%  . COLONOSCOPY  last one 2004  . EVALUATION UNDER ANESTHESIA WITH ANAL FISTULECTOMY N/A 02/13/2018   Procedure: ANAL EXAM UNDER ANESTHESIA WITH BIOPSY;  Surgeon: [name], MD;  Location: [facility];  Service: General;  Laterality: N/A;  . ORCHIECTOMY Left 1990   w/ placement prosthesis (for torsion)  . URETEROLITHOTOMY  1999   Family History:  Family History  Problem Relation Age of Onset  . Cancer Mother        breast cancer: stage I  . Hypertension Mother    Family Psychiatric  History: reports sister has been diagnosed with Bipolar Disorder and has attempted suicide in the past, maternal and paternal grandparents have history of alcohol abuse  Social  History:  Social History   Substance and Sexual Activity  Alcohol Use Yes   Comment: Daily     Social History   Substance and Sexual Activity  Drug Use No    Social History   Socioeconomic History  . Marital status: Single    Spouse name: Not on file  . Number of children: Not on file  . Years of education: Not on file  . Highest  education level: Not on file  Occupational History  . Not on file  Social Needs  . Financial resource strain: Not on file  . Food insecurity:    Worry: Not on file    Inability: Not on file  . Transportation needs:    Medical: Not on file    Non-medical: Not on file  Tobacco Use  . Smoking status: Former Smoker    Years: 10.00    Types: Cigarettes    Last attempt to quit: 02/07/2009    Years since quitting: 9.1  . Smokeless tobacco: Never Used  Substance and Sexual Activity  . Alcohol use: Yes    Comment: Daily  . Drug use: No  . Sexual activity: Yes    Partners: Male  Lifestyle  . Physical activity:    Days per week: Not on file    Minutes per session: Not on file  . Stress: Not on file  Relationships  . Social connections:    Talks on phone: Not on file    Gets together: Not on file    Attends religious service: Not on file    Active member of club or organization: Not on file    Attends meetings of clubs or organizations: Not on file    Relationship status: Not on file  Other Topics Concern  . Not on file  Social History Narrative   Lives with partner   Regular exercise: no   Caffeine use: 1 large cup of coffee daily; 2 to 3 sodas in the evening    Hospital Course:    04/10/18 [facility] MD Assessment: 42 year old male, lives with SO, employed.  He presented voluntarily due to worsening depression and suicidal ideations, with recent thoughts of jumping off a bridge. He describes history of chronic depression, states " I have been depressed for years ", but feels it has been worsening over recent weeks to months. States work related stressors have been a contributing factor. Endorses significant neuro-vegetative symptoms of depression as below, denies psychotic features .  In addition to depression he also endorses significant anxiety and presents quite anxious during session, although he tended to improve as session progresses. Describes anxiety as a tendency to worry  excessively. He also endorses history of alcohol use disorder, and states he has been drinking daily, often more than a bottle of wine per day.  At this time patient presents anxious, tremulous, " jittery", with some facial flushing, but vitals have been stable.   Patient remained on the [facility] unit for 3 days. The patient stabilized on medication and therapy. Patient was discharged on Cymbalta 60 mg Daily, Trazodone 50 mg QHS PRN, Vistaril 25 mg TID PRN. Patient has shown improvement with improved mood, affect, sleep, appetite, and interaction. Patient has attended group and participated. Patient has been seen in the day room interacting with peers and staff appropriately. Patient denies any SI/HI/AVH and contracts for safety. Patient agrees to follow up at [facility]. Patient is provided with prescriptions for their medications upon discharge.  Physical Findings:  AIMS: Facial and Oral Movements Muscles of Facial Expression: None, normal Lips and Perioral Area: None, normal Jaw: None, normal Tongue: None, normal,Extremity Movements Upper (arms, wrists, hands, fingers): None, normal Lower (legs, knees, ankles, toes): None, normal, Trunk Movements Neck, shoulders, hips: None, normal, Overall Severity Severity of abnormal movements (highest score from questions above): None, normal Incapacitation due to abnormal movements: None, normal Patient's awareness of abnormal movements (rate only patient's report): No Awareness, Dental Status Current problems with teeth and/or dentures?: No Does patient usually wear dentures?: No  CIWA:  CIWA-Ar Total: 0 COWS:     Musculoskeletal: Strength & Muscle Tone: within normal limits Gait & Station: normal Patient leans: N/A  Psychiatric Specialty Exam: Physical Exam  Nursing note and vitals reviewed. Constitutional: He is oriented to person, place, and time. He appears well-developed and well-nourished.  Cardiovascular: Normal rate.   Respiratory: Effort normal.  Musculoskeletal: Normal range of motion.  Neurological: He is alert and oriented to person, place, and time.  Skin: Skin is warm.    Review of Systems  Constitutional: Negative.   HENT: Negative.   Eyes: Negative.   Respiratory: Negative.   Cardiovascular: Negative.   Gastrointestinal: Negative.   Genitourinary: Negative.   Musculoskeletal: Negative.   Skin: Negative.   Neurological: Negative.   Endo/Heme/Allergies: Negative.   Psychiatric/Behavioral: Negative.     Blood pressure 127/80, pulse 78, temperature 100 F (37.8 C), temperature source Oral, resp. rate 16, height 6' (1.829 m), weight 120.2 kg.Body mass index is 35.94 kg/m.  General Appearance: Casual  Eye Contact:  Good  Speech:  Clear and Coherent and Normal Rate  Volume:  Normal  Mood:  Euthymic  Affect:  Congruent  Thought Process:  Goal Directed and Descriptions of Associations: Intact  Orientation:  Full (Time, Place, and Person)  Thought Content:  WDL  Suicidal Thoughts:  No  Homicidal Thoughts:  No  Memory:  Immediate;   Good Recent;   Good Remote;   Good  Judgement:  Fair  Insight:  Fair  Psychomotor Activity:  Normal  Concentration:  Concentration: Good and Attention Span: Good  Recall:  Good  Fund of Knowledge:  Good  Language:  Good  Akathisia:  No  Handed:  Right  AIMS (if indicated):     Assets:  Communication Skills Desire for Improvement Financial Resources/Insurance Housing Physical Health Social Support Transportation  ADL's:  Intact  Cognition:  WNL  Sleep:  Number of Hours: 6.75     Have you used any form of tobacco in the last 30 days? (Cigarettes, Smokeless Tobacco, Cigars, and/or Pipes): No  Has this patient used any form of tobacco in the last 30 days? (Cigarettes, Smokeless Tobacco, Cigars, and/or Pipes) Yes, No  Blood Alcohol level:  Lab Results  Component Value Date   ETH <10 82/42/3536    Metabolic Disorder Labs:  Lab Results   Component Value Date   HGBA1C 6.3 (H) 04/10/2018   MPG 134.11 04/10/2018   No results found for: PROLACTIN Lab Results  Component Value Date   CHOL 191 04/10/2018   TRIG 461 (H) 04/10/2018   HDL 31 (L) 04/10/2018   CHOLHDL 6.2 04/10/2018   VLDL UNABLE TO CALCULATE IF TRIGLYCERIDE OVER 400 mg/dL 04/10/2018   LDLCALC UNABLE TO CALCULATE IF TRIGLYCERIDE OVER 400 mg/dL 04/10/2018   LDLCALC  10/04/2009    39        Total Cholesterol/HDL:CHD Risk Coronary Heart Disease Risk Table  Men   Women  1/2 Average Risk   3.4   3.3  Average Risk       5.0   4.4  2 X Average Risk   9.6   7.1  3 X Average Risk  23.4   11.0        Use the calculated Patient Ratio above and the CHD Risk Table to determine the patient's CHD Risk.        ATP III CLASSIFICATION (LDL):  <100     mg/dL   Optimal  100-129  mg/dL   Near or Above                    Optimal  130-159  mg/dL   Borderline  160-189  mg/dL   High  >190     mg/dL   Very High    See Psychiatric Specialty Exam and Suicide Risk Assessment completed by Attending Physician prior to discharge.  Discharge destination:  Home  Is patient on multiple antipsychotic therapies at discharge:  No   Has Patient had three or more failed trials of antipsychotic monotherapy by history:  No  Recommended Plan for Multiple Antipsychotic Therapies: NA   Allergies as of 04/12/2018      Reactions   Atorvastatin Other (See Comments)   Muscle fatigue   Bee Venom Swelling   Sulfamethoxazole-trimethoprim Hives   Bactrim       Medication List    STOP taking these medications   buPROPion 150 MG 24 hr tablet Commonly known as:  WELLBUTRIN XL   Cranberry Extract 250 MG Tabs   FISH OIL PO   potassium chloride 10 MEQ tablet Commonly known as:  K-DUR,KLOR-CON   VITAMIN D3 PO     TAKE these medications     Indication  amLODipine 5 MG tablet Commonly known as:  NORVASC Take 1 tablet (5 mg total) by mouth daily. What changed:   when to take this  Indication:  High Blood Pressure Disorder   anastrozole 1 MG tablet Commonly known as:  ARIMIDEX Take 1 mg by mouth every morning.  Indication:  for elevated estradiol and for low testorone   ASPIRIN LOW DOSE 81 MG tablet Generic drug:  aspirin Take 81 mg by mouth every morning.  Indication:  Per PCP   atenolol 100 MG tablet Commonly known as:  TENORMIN Take 1 tablet (100 mg total) by mouth daily. What changed:  when to take this  Indication:  High Blood Pressure Disorder   busPIRone 10 MG tablet Commonly known as:  BUSPAR Take 1 tablet (10 mg total) by mouth daily. What changed:  when to take this  Indication:  Anxiety Disorder   clomiPHENE 50 MG tablet Commonly known as:  CLOMID Take 0.5 tablets (25 mg total) by mouth daily. What changed:  when to take this  Indication:  Per PCP   DULoxetine 60 MG capsule Commonly known as:  CYMBALTA Take 1 capsule (60 mg total) by mouth daily. For mood control Start taking on:  04/13/2018  Indication:  mood stability   gemfibrozil 600 MG tablet Commonly known as:  LOPID Take 1 tablet (600 mg total) by mouth daily. What changed:  when to take this  Indication:  Per PCP   hydrOXYzine 25 MG tablet Commonly known as:  ATARAX/VISTARIL Take 1 tablet (25 mg total) by mouth every 6 (six) hours as needed for anxiety.  Indication:  Feeling Anxious   lisinopril-hydrochlorothiazide 20-12.5 MG tablet Commonly known as:  PRINZIDE,ZESTORETIC Take 1 tablet by mouth daily. What changed:  when to take this  Indication:  High Blood Pressure Disorder   multivitamin with minerals Tabs tablet Take 1 tablet by mouth daily.  Indication:  Per PCP   mupirocin ointment 2 % Commonly known as:  BACTROBAN Place 1 application into the nose 2 (two) times daily.  Indication:  Per PCP   pantoprazole 40 MG tablet Commonly known as:  PROTONIX Take 1 tablet (40 mg total) by mouth daily. What changed:  when to take this  Indication:   Gastroesophageal Reflux Disease   rosuvastatin 10 MG tablet Commonly known as:  CRESTOR Take 1 tablet (10 mg total) by mouth at bedtime. TAKE ONE TABLET BY MOUTH NIGHTLY AT BEDTIME  Indication:  Elevation of Both Cholesterol and Triglycerides in Blood   traZODone 50 MG tablet Commonly known as:  DESYREL Take 1 tablet (50 mg total) by mouth at bedtime as needed for sleep.  Indication:  [facility] Follow up on 04/17/2018.   Why:  Thursday at [facility] with [facility], therapist.  Then 9/18, Wednesday, at 4:30 with Rosezella Rumpf for medication management. Please call them on Monday to confirm appointments, confirm insurance informatyion and pay $20.00 deposit that will go towards copay Contact information: [address] [place] [address] [phone]           Follow-up recommendations:  Continue activity as tolerated. Continue diet as recommended by your PCP. Ensure to keep all appointments with outpatient providers.  Comments:  Patient is instructed prior to discharge to: Take all medications as prescribed by his/her mental healthcare provider. Report any adverse effects and or reactions from the medicines to his/her outpatient provider promptly. Patient has been instructed & cautioned: To not engage in alcohol and or illegal drug use while on prescription medicines. In the event of worsening symptoms, patient is instructed to call the crisis hotline, 911 and or go to the nearest ED for appropriate evaluation and treatment of symptoms. To follow-up with his/her primary care provider for your other medical issues, concerns and or health care needs.    Signed: Lowry Ram Money, FNP 04/12/2018, 10:15 AM   Patient seen, Suicide Assessment Completed.  Disposition Plan Reviewed

## 2018-04-12 NOTE — [facility]HH Suicide Risk Assessment (Signed)
[facility] Discharge Suicide Risk Assessment   Principal Problem: Alcohol Use Disorder, Depression, Anxiety  Discharge Diagnoses:  Patient Active Problem List   Diagnosis Date Noted  . Severe recurrent major depression without psychotic features ([place]) [F33.2] 04/09/2018  . Depressive disorder [F32.9] 01/24/2018  . Vitamin B deficiency [E53.9] 01/24/2018  . Skin ulceration, limited to breakdown of skin ([place]) [L98.491] 12/20/2017  . GAD (generalized anxiety disorder) [F41.1] 12/20/2017  . Testicular discomfort [N50.819] 01/17/2017  . Obesity, unspecified [E66.9] 01/09/2017  . Elevated liver enzymes [R74.8] 11/08/2016  . Impaired fasting glucose [R73.01] 10/07/2016  . Potential exposure to STD [Z20.2] 07/11/2015  . Gynecomastia [N62] 01/06/2014  . ED (erectile dysfunction) [N52.9] 01/06/2014  . Estrogen excess [E28.0] 01/06/2014  . Fatigue [R53.83] 12/25/2013  . Hypogonadism male [E29.1] 06/25/2013  . OSA (obstructive sleep apnea) [G47.33] 05/19/2013  . Hypertriglyceridemia [E78.1] 04/14/2010  . Essential hypertension [I10] 04/14/2010  . PALPITATIONS [R00.2] 04/14/2010  . CHEST PAIN-UNSPECIFIED [R07.9] 04/14/2010    Total Time spent with patient: 30 minutes  Musculoskeletal: Strength & Muscle Tone: within normal limits no tremors, no diaphoresis, no restlessness, no visual disturbances  Gait & Station: normal Patient leans: N/A  Psychiatric Specialty Exam: ROS no headache, no visual disturbances, no diarrhea, no vomiting   Blood pressure 127/80, pulse 78, temperature 100 F (37.8 C), temperature source Oral, resp. rate 16, height 6' (1.829 m), weight 120.2 kg.Body mass index is 35.94 kg/m.  General Appearance: Well Groomed  Eye Contact::  Good  Speech:  Normal Rate409  Volume:  Normal  Mood:  reports improved mood, presents with fuller range of affect, states " I feel pretty good today"  Affect:  more reactive, smiles at times appopriately  Thought Process:  Linear and  Descriptions of Associations: Intact  Orientation:  Full (Time, Place, and Person)  Thought Content:  no hallucinations, no delusions, not internally preoccupied   Suicidal Thoughts:  No denies suicidal or self injurious ideations  Homicidal Thoughts:  No  Memory:  recent and remote grossly intact   Judgement:  Other:  improving   Insight:  improving   Psychomotor Activity:  Normal  Concentration:  Good  Recall:  Good  Fund of Knowledge:Good  Language: Good  Akathisia:  Negative  Handed:  Right  AIMS (if indicated):     Assets:  Desire for Improvement Resilience  Sleep:  Number of Hours: 6.75  Cognition: WNL  ADL's:  Intact   Mental Status Per Nursing Assessment::   On Admission:  Suicidal ideation indicated by patient, Self-harm thoughts  Demographic Factors:  42 year old male, employed, lives with partner, no children  Loss Factors: Job related stressors   Historical Factors: History of depression, anxiety, has been diagnosed with GAD in the past. Denies history of prior suicide attempts. History of alcohol use disorder   Risk Reduction Factors:   Employed, Living with another person, especially a relative, Positive social support and Positive coping skills or problem solving skills  Continued Clinical Symptoms:  Alert, attentive, well related, pleasant on approach, calm, no current symptoms of withdrawal - no tremors, no diaphoresis, no restlessness , no headache, no visual disturbances, vitals are stable. Mood improved, affect improved, more reactive, not anxious at this time, no thought disorder, no suicidal or self injurious ideations, no homicidal or violent ideations, future oriented. Plans to return to work early next week. Denies medication side effects. With his express consent I spoke with his SO, who has been visiting and who corroborates patient presents with  significant improvement and is in agreement with discharge. No disruptive or agitated behaviors on  unit, pleasant on approach.  We discussed medication options such as Campral /Acamprosate- at this time prefers not to start additional medication. He does express interest in participating in [facility] groups and attending AA regularly   Cognitive Features That Contribute To Risk:  No gross cognitive deficits noted upon discharge. Is alert , attentive, and oriented x 3  Suicide Risk:  Mild:  Suicidal ideation of limited frequency, intensity, duration, and specificity.  There are no identifiable plans, no associated intent, mild dysphoria and related symptoms, good self-control (both objective and subjective assessment), few other risk factors, and identifiable protective factors, including available and accessible social support.  Follow-up [place] Follow up on 04/17/2018.   Why:  Thursday at [facility] with [facility], therapist.  Then 9/18, Wednesday, at 4:30 with Rosezella Rumpf for medication management. Please call them on Monday to confirm appointments, confirm insurance informatyion and pay $20.00 deposit that will go towards copay Contact information: [place] [address] [phone]           Plan Of Care/Follow-up recommendations:  Activity:  as tolerated  Diet:  heart healthy diet  Tests:  NA Other:  See below  Patient is expressing readiness for discharge and is leaving unit in good spirits, no current grounds for involuntary commitment  - plans to return home. Plans to go to AA. Plans to follow up as above. He has an established PCP for medical management as needed - Dr. Zigmund Daniel .      Jenne Campus, MD 04/12/2018, 9:19 AM

## 2018-04-20 ENCOUNTER — Other Ambulatory Visit: Payer: Self-pay | Admitting: Family Medicine

## 2018-04-21 MED ORDER — LISINOPRIL-HYDROCHLOROTHIAZIDE 20-12.5 MG PO TABS: 1.0000 | ORAL_TABLET | Freq: Every day | ORAL | 0 refills | Status: DC

## 2018-06-06 ENCOUNTER — Encounter: Payer: Self-pay | Admitting: Family Medicine

## 2018-06-06 ENCOUNTER — Ambulatory Visit (INDEPENDENT_AMBULATORY_CARE_PROVIDER_SITE_OTHER): Payer: Commercial Managed Care - PPO | Admitting: Family Medicine

## 2018-06-06 VITALS — BP 130/80 | HR 73 | Temp 98.1°F | Wt 266.4 lb

## 2018-06-06 DIAGNOSIS — M545 Low back pain, unspecified: Secondary | ICD-10-CM | POA: Insufficient documentation

## 2018-06-06 DIAGNOSIS — E785 Hyperlipidemia, unspecified: Secondary | ICD-10-CM | POA: Diagnosis not present

## 2018-06-06 DIAGNOSIS — E781 Pure hyperglyceridemia: Secondary | ICD-10-CM

## 2018-06-06 DIAGNOSIS — R7309 Other abnormal glucose: Secondary | ICD-10-CM | POA: Diagnosis not present

## 2018-06-06 DIAGNOSIS — F411 Generalized anxiety disorder: Secondary | ICD-10-CM | POA: Diagnosis not present

## 2018-06-06 DIAGNOSIS — I10 Essential (primary) hypertension: Secondary | ICD-10-CM

## 2018-06-06 MED ORDER — DICLOFENAC SODIUM 75 MG PO TBEC: 75.0000 mg | DELAYED_RELEASE_TABLET | Freq: Two times a day (BID) | ORAL | 0 refills | Status: DC

## 2018-06-06 MED ORDER — CYCLOBENZAPRINE HCL 10 MG PO TABS: 10.0000 mg | ORAL_TABLET | Freq: Three times a day (TID) | ORAL | 0 refills | Status: DC | PRN

## 2018-06-06 NOTE — Assessment & Plan Note (Signed)
BP is well controlled Continue current medications

## 2018-06-06 NOTE — Assessment & Plan Note (Signed)
-  Printed HEP handout  -Rx for meloxicam and flexeril -He will let me know if not improving.

## 2018-06-06 NOTE — Assessment & Plan Note (Addendum)
-  Improved since hospitalization.  He plans to continue to follow with behavioral health.  -Encouraged to abstain from EtOH products to treat his anxiety.

## 2018-06-06 NOTE — Assessment & Plan Note (Signed)
-  Update fasting lipids -Doing well with crestor, will continue

## 2018-06-06 NOTE — Progress Notes (Signed)
Tyler Gross - 42 y.o. male MRN [id number]  Date of birth: 11/10/1975  Subjective Chief Complaint  Patient presents with  . Back Pain    HPI Tyler Gross is a 42 y.o. male with history of HTN, HLD, pre-diabetes, hypogonadism, and depression with anxiety here today with complaint of low back pain.  He was also recently hospitalized for worsening depression and anxiety with binge drinking disorder.  He had medications changed and is doing well since starting cymbalta and gabapentin.    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  Pain started on L side, now more in mid-back area.  Has some pain in legs as well.  Denies numbness/tingling.  Tends to be worse with certain movements.  Has tried ibuprofen but not much relief from this.  No dysuria, fever, chills.  Has had some increased diarrhea but reports but not abnormal for him to have episodes of diarrhea.   -HTN:  He continues to do well with current medications.  BP at home have been well controlled.  Wanting to get updated labs today.  No chest pain, shortness of breath, palpitations or increased headache..   -HLD:  Treated with crestor, doing well.  TG have been elevated in the past as well, possibly due to diet/ EtOH use.   ROS:  A comprehensive ROS was completed and negative except as noted per HPI  Allergies  Allergen Reactions  . Atorvastatin Other (See Comments)    Muscle fatigue  . Bee Venom Swelling  . Sulfamethoxazole-Trimethoprim Hives    Bactrim     Past Medical History:  Diagnosis Date  . Anal lesion    nonhealing  . Depression   . ED (erectile dysfunction)   . GAD (generalized anxiety disorder)   . Gynecomastia, male    followed by dr Shellia Cleverly  . High serum estradiol   . History of kidney stones   . Hypertension   . Mixed hyperlipidemia   . OSA on CPAP    per last study 07-31-2013  severe osa  . Pre-diabetes   . Primary hypogonadism in male    endocrinologist-  dr  Shellia Cleverly  . Wears glasses     Past Surgical History:  Procedure Laterality Date  . CARDIOVASCULAR STRESS TEST  10/05/2009   normal nuclear study w/ no ischemia/  normal LV function and wall motion , ef 71%  . COLONOSCOPY  last one 2004  . EVALUATION UNDER ANESTHESIA WITH ANAL FISTULECTOMY N/A 02/13/2018   Procedure: ANAL EXAM UNDER ANESTHESIA WITH BIOPSY;  Surgeon: [name], MD;  Location: [place];  Service: General;  Laterality: N/A;  . ORCHIECTOMY Left 1990   w/ placement prosthesis (for torsion)  . URETEROLITHOTOMY  1999    Social History   Socioeconomic History  . Marital status: Single    Spouse name: Not on file  . Number of children: Not on file  . Years of education: Not on file  . Highest education level: Not on file  Occupational History  . Not on file  Social Needs  . Financial resource strain: Not on file  . Food insecurity:    Worry: Not on file    Inability: Not on file  . Transportation needs:    Medical: Not on file    Non-medical: Not on file  Tobacco Use  . Smoking status: Former Smoker    Years: 10.00    Types: Cigarettes    Last  attempt to quit: 02/07/2009    Years since quitting: 9.3  . Smokeless tobacco: Never Used  Substance and Sexual Activity  . Alcohol use: Yes    Comment: Daily  . Drug use: No  . Sexual activity: Yes    Partners: Male  Lifestyle  . Physical activity:    Days per week: Not on file    Minutes per session: Not on file  . Stress: Not on file  Relationships  . Social connections:    Talks on phone: Not on file    Gets together: Not on file    Attends religious service: Not on file    Active member of club or organization: Not on file    Attends meetings of clubs or organizations: Not on file    Relationship status: Not on file  Other Topics Concern  . Not on file  Social History Narrative   Lives with partner   Regular exercise: no   Caffeine use: 1 large cup of coffee daily; 2 to 3 sodas in  the evening    Family History  Problem Relation Age of Onset  . Cancer Mother        breast cancer: stage I  . Hypertension Mother     Health Maintenance  Topic Date Due  . INFLUENZA VACCINE  03/06/2018  . HIV Screening  12/21/2018 (Originally 11/16/1989)  . TETANUS/TDAP  01/27/2024    ----------------------------------------------------------------------------------------------------------------------------------------------------------------------------------------------------------------- Physical Exam BP 130/80   Pulse 73   Temp 98.1 F (36.7 C)   Wt 266 lb 6.4 oz (120.8 kg)   SpO2 98%   BMI 36.13 kg/m   Physical Exam  Constitutional: He is oriented to person, place, and time. He appears well-nourished. No distress.  HENT:  Head: Normocephalic and atraumatic.  Mouth/Throat: Oropharynx is clear and moist.  Eyes: No scleral icterus.  Neck: Neck supple. No thyromegaly present.  Cardiovascular: Normal rate, regular rhythm and normal heart sounds.  Pulmonary/Chest: Effort normal and breath sounds normal.  Musculoskeletal:  ROM Of lumbar spine is normal.  Normal to inspection and palpation.  No step-off palpated.  No ttp along lumbar spine or paraspinals.  SLR is equivocal with pain to lower back, tight hamstrings.  Strength is normal.   Neurological: He is alert and oriented to person, place, and time.  Skin: Skin is warm and dry.  Psychiatric: He has a normal mood and affect. His behavior is normal.    ------------------------------------------------------------------------------------------------------------------------------------------------------------------------------------------------------------------- Assessment and Plan  GAD (generalized anxiety disorder) -Improved since hospitalization.  He plans to continue to follow with behavioral health.  -Encouraged to abstain from EtOH products to treat his anxiety.   Essential hypertension BP is well  controlled Continue current medications  Hypertriglyceridemia -Update fasting lipids -Doing well with crestor, will continue   Acute bilateral low back pain without sciatica -Printed HEP handout  -Rx for meloxicam and flexeril -He will let me know if not improving.

## 2018-06-06 NOTE — Patient Instructions (Signed)
Low Back Sprain Rehab  Ask your health care provider which exercises are safe for you. Do exercises exactly as told by your health care provider and adjust them as directed. It is normal to feel mild stretching, pulling, tightness, or discomfort as you do these exercises, but you should stop right away if you feel sudden pain or your pain gets worse. Do not begin these exercises until told by your health care provider.  Stretching and range of motion exercises  These exercises warm up your muscles and joints and improve the movement and flexibility of your back. These exercises also help to relieve pain, numbness, and tingling.  Exercise A: Lumbar rotation    1. Lie on your back on a firm surface and bend your knees.  2. Straighten your arms out to your sides so each arm forms an "L" shape with a side of your body (a 90 degree angle).  3. Slowly move both of your knees to one side of your body until you feel a stretch in your lower back. Try not to let your shoulders move off of the floor.  4. Hold for __________ seconds.  5. Tense your abdominal muscles and slowly move your knees back to the starting position.  6. Repeat this exercise on the other side of your body.  Repeat __________ times. Complete this exercise __________ times a day.  Exercise B: Prone extension on elbows    1. Lie on your abdomen on a firm surface.  2. Prop yourself up on your elbows.  3. Use your arms to help lift your chest up until you feel a gentle stretch in your abdomen and your lower back.  ? This will place some of your body weight on your elbows. If this is uncomfortable, try stacking pillows under your chest.  ? Your hips should stay down, against the surface that you are lying on. Keep your hip and back muscles relaxed.  4. Hold for __________ seconds.  5. Slowly relax your upper body and return to the starting position.  Repeat __________ times. Complete this exercise __________ times a day.  Strengthening exercises  These  exercises build strength and endurance in your back. Endurance is the ability to use your muscles for a long time, even after they get tired.  Exercise C: Pelvic tilt  1. Lie on your back on a firm surface. Bend your knees and keep your feet flat.  2. Tense your abdominal muscles. Tip your pelvis up toward the ceiling and flatten your lower back into the floor.  ? To help with this exercise, you may place a small towel under your lower back and try to push your back into the towel.  3. Hold for __________ seconds.  4. Let your muscles relax completely before you repeat this exercise.  Repeat __________ times. Complete this exercise __________ times a day.  Exercise D: Alternating arm and leg raises    1. Get on your hands and knees on a firm surface. If you are on a hard floor, you may want to use padding to cushion your knees, such as an exercise mat.  2. Line up your arms and legs. Your hands should be below your shoulders, and your knees should be below your hips.  3. Lift your left leg behind you. At the same time, raise your right arm and straighten it in front of you.  ? Do not lift your leg higher than your hip.  ? Do not lift your arm   higher than your shoulder.  ? Keep your abdominal and back muscles tight.  ? Keep your hips facing the ground.  ? Do not arch your back.  ? Keep your balance carefully, and do not hold your breath.  4. Hold for __________ seconds.  5. Slowly return to the starting position and repeat with your right leg and your left arm.  Repeat __________ times. Complete this exercise __________ times a day.  Exercise E: Abdominal set with straight leg raise    1. Lie on your back on a firm surface.  2. Bend one of your knees and keep your other leg straight.  3. Tense your abdominal muscles and lift your straight leg up, 4-6 inches (10-15 cm) off the ground.  4. Keep your abdominal muscles tight and hold for __________ seconds.  ? Do not hold your breath.  ? Do not arch your back. Keep it  flat against the ground.  5. Keep your abdominal muscles tense as you slowly lower your leg back to the starting position.  6. Repeat with your other leg.  Repeat __________ times. Complete this exercise __________ times a day.  Posture and body mechanics    Body mechanics refers to the movements and positions of your body while you do your daily activities. Posture is part of body mechanics. Good posture and healthy body mechanics can help to relieve stress in your body's tissues and joints. Good posture means that your spine is in its natural S-curve position (your spine is neutral), your shoulders are pulled back slightly, and your head is not tipped forward. The following are general guidelines for applying improved posture and body mechanics to your everyday activities.  Standing    · When standing, keep your spine neutral and your feet about hip-width apart. Keep a slight bend in your knees. Your ears, shoulders, and hips should line up.  · When you do a task in which you stand in one place for a long time, place one foot up on a stable object that is 2-4 inches (5-10 cm) high, such as a footstool. This helps keep your spine neutral.  Sitting    · When sitting, keep your spine neutral and keep your feet flat on the floor. Use a footrest, if necessary, and keep your thighs parallel to the floor. Avoid rounding your shoulders, and avoid tilting your head forward.  · When working at a desk or a computer, keep your desk at a height where your hands are slightly lower than your elbows. Slide your chair under your desk so you are close enough to maintain good posture.  · When working at a computer, place your monitor at a height where you are looking straight ahead and you do not have to tilt your head forward or downward to look at the screen.  Resting    · When lying down and resting, avoid positions that are most painful for you.  · If you have pain with activities such as sitting, bending, stooping, or squatting  (flexion-based activities), lie in a position in which your body does not bend very much. For example, avoid curling up on your side with your arms and knees near your chest (fetal position).  · If you have pain with activities such as standing for a long time or reaching with your arms (extension-based activities), lie with your spine in a neutral position and bend your knees slightly. Try the following positions:  · Lying on your side with a   pillow between your knees.  · Lying on your back with a pillow under your knees.  Lifting    · When lifting objects, keep your feet at least shoulder-width apart and tighten your abdominal muscles.  · Bend your knees and hips and keep your spine neutral. It is important to lift using the strength of your legs, not your back. Do not lock your knees straight out.  · Always ask for help to lift heavy or awkward objects.  This information is not intended to replace advice given to you by your health care provider. Make sure you discuss any questions you have with your health care provider.  Document Released: 07/23/2005 Document Revised: 03/29/2016 Document Reviewed: 05/04/2015  Elsevier Interactive Patient Education © 2018 [employer]

## 2018-06-07 LAB — COMPREHENSIVE METABOLIC PANEL
AG RATIO: 2.2 (calc) (ref 1.0–2.5)
ALT: 30 U/L (ref 9–46)
AST: 30 U/L (ref 10–40)
Albumin: 4.8 g/dL (ref 3.6–5.1)
Alkaline phosphatase (APISO): 61 U/L (ref 40–115)
BILIRUBIN TOTAL: 0.8 mg/dL (ref 0.2–1.2)
BUN: 16 mg/dL (ref 7–25)
CHLORIDE: 107 mmol/L (ref 98–110)
CO2: 24 mmol/L (ref 20–32)
Calcium: 9.8 mg/dL (ref 8.6–10.3)
Creat: 1.19 mg/dL (ref 0.60–1.35)
GLOBULIN: 2.2 g/dL (ref 1.9–3.7)
GLUCOSE: 91 mg/dL (ref 65–99)
POTASSIUM: 4 mmol/L (ref 3.5–5.3)
SODIUM: 144 mmol/L (ref 135–146)
TOTAL PROTEIN: 7 g/dL (ref 6.1–8.1)

## 2018-06-07 LAB — LIPID PANEL
Cholesterol: 172 mg/dL (ref <200)
HDL: 35 mg/dL — ABNORMAL LOW (ref >40)
LDL Cholesterol (Calc): 108 mg/dL (calc) — ABNORMAL HIGH
NON-HDL CHOLESTEROL (CALC): 137 mg/dL — AB (ref <130)
Total CHOL/HDL Ratio: 4.9 (calc) (ref <5.0)
Triglycerides: 169 mg/dL — ABNORMAL HIGH (ref <150)

## 2018-06-07 LAB — HEMOGLOBIN A1C
Hgb A1c MFr Bld: 6.8 % of total Hgb — ABNORMAL HIGH (ref <5.7)
Mean Plasma Glucose: 148 (calc)
eAG (mmol/L): 8.2 (calc)

## 2018-06-07 LAB — TSH: TSH: 0.67 m[IU]/L (ref 0.40–4.50)

## 2018-06-10 ENCOUNTER — Encounter: Payer: Self-pay | Admitting: Family Medicine

## 2018-06-11 ENCOUNTER — Other Ambulatory Visit: Payer: Self-pay | Admitting: Family Medicine

## 2018-06-11 MED ORDER — METFORMIN HCL ER 500 MG PO TB24: 500.0000 mg | ORAL_TABLET | Freq: Every day | ORAL | 1 refills | Status: DC

## 2018-06-14 ENCOUNTER — Other Ambulatory Visit: Payer: Self-pay | Admitting: Family Medicine

## 2018-06-25 ENCOUNTER — Ambulatory Visit: Payer: Commercial Managed Care - PPO | Admitting: Family Medicine

## 2018-06-26 ENCOUNTER — Ambulatory Visit (INDEPENDENT_AMBULATORY_CARE_PROVIDER_SITE_OTHER): Payer: Commercial Managed Care - PPO | Admitting: Family Medicine

## 2018-06-26 ENCOUNTER — Encounter: Payer: Self-pay | Admitting: Family Medicine

## 2018-06-26 VITALS — BP 110/72 | HR 80 | Temp 98.6°F | Wt 267.2 lb

## 2018-06-26 DIAGNOSIS — E119 Type 2 diabetes mellitus without complications: Secondary | ICD-10-CM | POA: Diagnosis not present

## 2018-06-26 DIAGNOSIS — R5383 Other fatigue: Secondary | ICD-10-CM | POA: Diagnosis not present

## 2018-06-26 DIAGNOSIS — L959 Vasculitis limited to the skin, unspecified: Secondary | ICD-10-CM | POA: Diagnosis not present

## 2018-06-26 MED ORDER — TRIAMCINOLONE ACETONIDE 0.1 % EX CREA: 1.0000 "application " | TOPICAL_CREAM | Freq: Two times a day (BID) | CUTANEOUS | 0 refills | Status: DC

## 2018-06-26 NOTE — Assessment & Plan Note (Signed)
-  Unclear etiology, using CPAP as directed -Followed by urology and testocerone levels look ok per patient.  -Update B12, bmp and CBC today.  TSH in 04/2018 wnl.

## 2018-06-26 NOTE — Assessment & Plan Note (Signed)
-  Recent dx of T2DM -having some diarrhea, unclear if this is from metformin or baseline gi issues.  -He will try to stick to metformin for now but let me know if this is not improving.

## 2018-06-26 NOTE — Progress Notes (Signed)
JOSHWA HEMRIC - 42 y.o. male MRN [id number]  Date of birth: 1976-02-08  Subjective Chief Complaint  Patient presents with  . Rash    HPI BRODERIC BARA is a 42 y.o. male with history of Depression and anxiety, HTN, T2DM, HLD, OSA and Low testosterone here today with complaint of rash.   -Rash:  Rash to L posterior leg and ankle.  Reports that this is very itchy.  Started this past weekend after walking more than he typically does while visiting the mountains of [place].  He denies swelling or pain.  Has not tried anything for treatment so far.   -T2DM:  Recently started on metformin for new diagnosis of diabetes.  Has had issues with intermittent diarrhea which worsened some when starting metformin.  Bowel movement have returned to normal over the past 2 days.    -Fatigue:  Reports increased fatigue over this past weekend during trip to the mountains.  This has improved since returning home.  Unable to keep his eyes open after sitting for just a couple of minutes.  He also noticed increased appetite that developed around the same time.  This has continued.  He reports compliance with CPAP therapy.    ROS:  A comprehensive ROS was completed and negative except as noted per HPI  Allergies  Allergen Reactions  . Atorvastatin Other (See Comments)    Muscle fatigue  . Bee Venom Swelling  . Sulfamethoxazole-Trimethoprim Hives    Bactrim     Past Medical History:  Diagnosis Date  . Anal lesion    nonhealing  . Depression   . ED (erectile dysfunction)   . GAD (generalized anxiety disorder)   . Gynecomastia, male    followed by dr Shellia Cleverly  . High serum estradiol   . History of kidney stones   . Hypertension   . Mixed hyperlipidemia   . OSA on CPAP    per last study 07-31-2013  severe osa  . Pre-diabetes   . Primary hypogonadism in male    endocrinologist-  dr Shellia Cleverly  . Wears glasses     Past Surgical History:  Procedure Laterality Date  . CARDIOVASCULAR STRESS TEST   10/05/2009   normal nuclear study w/ no ischemia/  normal LV function and wall motion , ef 71%  . COLONOSCOPY  last one 2004  . EVALUATION UNDER ANESTHESIA WITH ANAL FISTULECTOMY N/A 02/13/2018   Procedure: ANAL EXAM UNDER ANESTHESIA WITH BIOPSY;  Surgeon: [name], MD;  Location: [facility];  Service: General;  Laterality: N/A;  . ORCHIECTOMY Left 1990   w/ placement prosthesis (for torsion)  . URETEROLITHOTOMY  1999    Social History   Socioeconomic History  . Marital status: Single    Spouse name: Not on file  . Number of children: Not on file  . Years of education: Not on file  . Highest education level: Not on file  Occupational History  . Not on file  Social Needs  . Financial resource strain: Not on file  . Food insecurity:    Worry: Not on file    Inability: Not on file  . Transportation needs:    Medical: Not on file    Non-medical: Not on file  Tobacco Use  . Smoking status: Former Smoker    Years: 10.00    Types: Cigarettes    Last attempt to quit: 02/07/2009    Years since quitting: 9.3  . Smokeless tobacco: Never Used  Substance and Sexual Activity  .  Alcohol use: Yes    Comment: Daily  . Drug use: No  . Sexual activity: Yes    Partners: Male  Lifestyle  . Physical activity:    Days per week: Not on file    Minutes per session: Not on file  . Stress: Not on file  Relationships  . Social connections:    Talks on phone: Not on file    Gets together: Not on file    Attends religious service: Not on file    Active member of club or organization: Not on file    Attends meetings of clubs or organizations: Not on file    Relationship status: Not on file  Other Topics Concern  . Not on file  Social History Narrative   Lives with partner   Regular exercise: no   Caffeine use: 1 large cup of coffee daily; 2 to 3 sodas in the evening    Family History  Problem Relation Age of Onset  . Cancer Mother        breast cancer: stage I  .  Hypertension Mother     Health Maintenance  Topic Date Due  . INFLUENZA VACCINE  03/06/2018  . HIV Screening  12/21/2018 (Originally 11/16/1989)  . TETANUS/TDAP  01/27/2024    ----------------------------------------------------------------------------------------------------------------------------------------------------------------------------------------------------------------- Physical Exam BP 110/72   Pulse 80   Temp 98.6 F (37 C) (Oral)   Wt 267 lb 3.2 oz (121.2 kg)   SpO2 96%   BMI 36.24 kg/m   Physical Exam  Constitutional: He is oriented to person, place, and time. He appears well-nourished. No distress.  HENT:  Head: Normocephalic and atraumatic.  Mouth/Throat: Oropharynx is clear and moist.  Eyes: No scleral icterus.  Neck: Neck supple. No thyromegaly present.  Cardiovascular: Normal rate, regular rhythm and normal heart sounds.  Pulmonary/Chest: Effort normal and breath sounds normal.  Lymphadenopathy:    He has no cervical adenopathy.  Neurological: He is alert and oriented to person, place, and time.  Skin: Skin is warm.  Mild scaling with scattered petechiae along posterior calf and ankle area of L leg. .  Some areas of hemosiderin deposition along bilateral legs.    Psychiatric: He has a normal mood and affect. His behavior is normal.    ------------------------------------------------------------------------------------------------------------------------------------------------------------------------------------------------------------------- Assessment and Plan  Type 2 diabetes mellitus without complication, without long-term current use of insulin (HCC) -Recent dx of T2DM -having some diarrhea, unclear if this is from metformin or baseline gi issues.  -He will try to stick to metformin for now but let me know if this is not improving.   Fatigue -Unclear etiology, using CPAP as directed -Followed by urology and testocerone levels look ok per  patient.  -Update B12, bmp and CBC today.  TSH in 04/2018 wnl.   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   -Rx for triamcinolone cream -Instructed to keep legs elevated at rest.

## 2018-06-26 NOTE — Patient Instructions (Signed)
-  Try to stick with the metformin for now, if diarrhea returns let me know.  -Use cream on rash, let me know if not clearing up.  -We'll be in touch with lab results.

## 2018-06-26 NOTE — Assessment & Plan Note (Signed)
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   -Rx for triamcinolone cream -Instructed to keep legs elevated at rest.

## 2018-06-27 LAB — BASIC METABOLIC PANEL
BUN: 16 mg/dL (ref 6–23)
CO2: 27 mEq/L (ref 19–32)
Calcium: 9.6 mg/dL (ref 8.4–10.5)
Chloride: 106 mEq/L (ref 96–112)
Creatinine, Ser: 1.19 mg/dL (ref 0.40–1.50)
GFR: 70.71 mL/min (ref >60.00)
Glucose, Bld: 87 mg/dL (ref 70–99)
POTASSIUM: 4.5 meq/L (ref 3.5–5.1)
Sodium: 144 mEq/L (ref 135–145)

## 2018-06-27 LAB — CBC
HEMATOCRIT: 45.7 % (ref 39.0–52.0)
HEMOGLOBIN: 15 g/dL (ref 13.0–17.0)
MCHC: 32.9 g/dL (ref 30.0–36.0)
MCV: 86.7 fl (ref 78.0–100.0)
Platelets: 288 10*3/uL (ref 150.0–400.0)
RBC: 5.27 Mil/uL (ref 4.22–5.81)
RDW: 14.8 % (ref 11.5–15.5)
WBC: 9.7 10*3/uL (ref 4.0–10.5)

## 2018-06-27 LAB — B12 AND FOLATE PANEL
Folate: 21.2 ng/mL (ref >5.9)
Vitamin B-12: 280 pg/mL (ref 211–911)

## 2018-07-18 ENCOUNTER — Encounter: Payer: Self-pay | Admitting: Family Medicine

## 2018-08-19 ENCOUNTER — Ambulatory Visit: Payer: Self-pay | Admitting: Registered"

## 2018-08-25 ENCOUNTER — Encounter: Payer: Self-pay | Admitting: Family Medicine

## 2018-08-29 ENCOUNTER — Encounter: Payer: Self-pay | Admitting: Family Medicine

## 2018-08-29 MED ORDER — LISINOPRIL-HYDROCHLOROTHIAZIDE 20-12.5 MG PO TABS: 1.0000 | ORAL_TABLET | Freq: Every day | ORAL | 2 refills | Status: DC

## 2018-08-29 MED ORDER — METFORMIN HCL ER 500 MG PO TB24: 500.0000 mg | ORAL_TABLET | Freq: Every day | ORAL | 1 refills | Status: DC

## 2018-08-29 MED ORDER — POTASSIUM CHLORIDE ER 10 MEQ PO TBCR: 10.0000 meq | EXTENDED_RELEASE_TABLET | Freq: Every day | ORAL | 2 refills | Status: DC

## 2018-08-29 MED ORDER — ROSUVASTATIN CALCIUM 10 MG PO TABS: 10.0000 mg | ORAL_TABLET | Freq: Every day | ORAL | 0 refills | Status: DC

## 2018-08-29 MED ORDER — GEMFIBROZIL 600 MG PO TABS: 600.0000 mg | ORAL_TABLET | Freq: Two times a day (BID) | ORAL | 0 refills | Status: DC

## 2018-08-29 MED ORDER — AMLODIPINE BESYLATE 5 MG PO TABS: 5.0000 mg | ORAL_TABLET | Freq: Every morning | ORAL | 0 refills | Status: DC

## 2018-08-29 MED ORDER — ATENOLOL 100 MG PO TABS: 100.0000 mg | ORAL_TABLET | Freq: Every morning | ORAL | 0 refills | Status: DC

## 2018-09-10 ENCOUNTER — Ambulatory Visit: Payer: Commercial Managed Care - PPO | Admitting: Family Medicine

## 2018-09-10 ENCOUNTER — Encounter: Payer: Self-pay | Admitting: Family Medicine

## 2018-09-10 VITALS — BP 132/76 | HR 82 | Temp 98.1°F | Ht 72.0 in | Wt 265.0 lb

## 2018-09-10 DIAGNOSIS — F411 Generalized anxiety disorder: Secondary | ICD-10-CM

## 2018-09-10 DIAGNOSIS — E119 Type 2 diabetes mellitus without complications: Secondary | ICD-10-CM | POA: Diagnosis not present

## 2018-09-10 DIAGNOSIS — E538 Deficiency of other specified B group vitamins: Secondary | ICD-10-CM | POA: Diagnosis not present

## 2018-09-10 DIAGNOSIS — F1024 Alcohol dependence with alcohol-induced mood disorder: Secondary | ICD-10-CM

## 2018-09-10 DIAGNOSIS — G4733 Obstructive sleep apnea (adult) (pediatric): Secondary | ICD-10-CM | POA: Diagnosis not present

## 2018-09-10 LAB — VITAMIN B12: Vitamin B-12: 285 pg/mL (ref 211–911)

## 2018-09-10 LAB — HEMOGLOBIN A1C: HEMOGLOBIN A1C: 6.5 % (ref 4.6–6.5)

## 2018-09-10 MED ORDER — DICLOFENAC SODIUM 75 MG PO TBEC: 75.0000 mg | DELAYED_RELEASE_TABLET | Freq: Two times a day (BID) | ORAL | 3 refills | Status: DC

## 2018-09-10 NOTE — Patient Instructions (Signed)
Great to see you! Continue current medications I will see you back in about 3 months.

## 2018-09-10 NOTE — Assessment & Plan Note (Signed)
-  He has done well with CPAP however current machine is not functioning properly.  Will provide orders for new machine.

## 2018-09-10 NOTE — Progress Notes (Signed)
Tyler Gross - 43 y.o. male MRN [id number]  Date of birth: [birth year]  Subjective Chief Complaint  Patient presents with  . Follow-up    wants to discuss sleep study and labs    HPI Tyler Gross is a 43 y.o. male with history of HTN, T2DM, anxiety and OSA here today for follow up of the following.   -T2DM:  History of T2DM, has been on metformin xr for about 3 months now.  He is doing fairly well with this, has some occasional diarrhea but no worse than baseline IBS symptoms.  He has not really made lifestyle changes and has replaced EtOH with food.  He denies increased fatigue, thirst or urination.   -OSA:  History of severe OSA uses CPAP.  Last sleep study completed 07/2013.  Current settings are pressure of 10 cm H2O.  He reports that his machine is not functioning very well and cuts off frequently during use.  Also has leaks around hose connection.  He requests rx for new machine.  Reports current settings have worked well when machine is functioning.    -Anxiety:  Current treatment with cymbalta , doing fairly well with this.  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  He feels like he is carrying a lot of tension in his neck and shoulders.  This has improved with diclofenac in the past, he is requesting a refill.    ROS:  A comprehensive ROS was completed and negative except as noted per HPI  Allergies  Allergen Reactions  . Atorvastatin Other (See Comments)    Muscle fatigue  . Bee Venom Swelling  . Sulfamethoxazole-Trimethoprim Hives    Bactrim     Past Medical History:  Diagnosis Date  . Anal lesion    nonhealing  . Depression   . ED (erectile dysfunction)   . GAD (generalized anxiety disorder)   . Gynecomastia, male    followed by dr Shellia Cleverly  . High serum estradiol   . History of kidney stones   . Hypertension   . Mixed  hyperlipidemia   . OSA on CPAP    per last study 07-31-2013  severe osa  . Pre-diabetes   . Primary hypogonadism in male    endocrinologist-  dr Shellia Cleverly  . Wears glasses     Past Surgical History:  Procedure Laterality Date  . CARDIOVASCULAR STRESS TEST  10/05/2009   normal nuclear study w/ no ischemia/  normal LV function and wall motion , ef 71%  . COLONOSCOPY  last one 2004  . EVALUATION UNDER ANESTHESIA WITH ANAL FISTULECTOMY N/A 02/13/2018   Procedure: ANAL EXAM UNDER ANESTHESIA WITH BIOPSY;  Surgeon: [name], MD;  Location: [facility];  Service: General;  Laterality: N/A;  . ORCHIECTOMY Left 1990   w/ placement prosthesis (for torsion)  . URETEROLITHOTOMY  1999    Social History   Socioeconomic History  . Marital status: Single    Spouse name: Not on file  . Number of children: Not on file  . Years of education: Not on file  . Highest education level: Not on file  Occupational History  . Not on file  Social Needs  . Financial resource strain: Not on file  . Food insecurity:    Worry: Not on file    Inability: Not on file  . Transportation needs:    Medical:  Not on file    Non-medical: Not on file  Tobacco Use  . Smoking status: Former Smoker    Years: 10.00    Types: Cigarettes    Last attempt to quit: 02/07/2009    Years since quitting: 9.5  . Smokeless tobacco: Never Used  Substance and Sexual Activity  . Alcohol use: Yes    Comment: Daily  . Drug use: No  . Sexual activity: Yes    Partners: Male  Lifestyle  . Physical activity:    Days per week: Not on file    Minutes per session: Not on file  . Stress: Not on file  Relationships  . Social connections:    Talks on phone: Not on file    Gets together: Not on file    Attends religious service: Not on file    Active member of club or organization: Not on file    Attends meetings of clubs or organizations: Not on file    Relationship status: Not on file  Other Topics Concern  .  Not on file  Social History Narrative   Lives with partner   Regular exercise: no   Caffeine use: 1 large cup of coffee daily; 2 to 3 sodas in the evening    Family History  Problem Relation Age of Onset  . Cancer Mother        breast cancer: stage I  . Hypertension Mother     Health Maintenance  Topic Date Due  . PNEUMOCOCCAL POLYSACCHARIDE VACCINE AGE 60-64 HIGH RISK  11/16/1976  . FOOT EXAM  11/16/1984  . OPHTHALMOLOGY EXAM  11/16/1984  . HIV Screening  12/21/2018 (Originally 11/16/1989)  . HEMOGLOBIN A1C  12/05/2018  . TETANUS/TDAP  01/27/2024  . INFLUENZA VACCINE  Completed    ----------------------------------------------------------------------------------------------------------------------------------------------------------------------------------------------------------------- Physical Exam BP 132/76   Pulse 82   Temp 98.1 F (36.7 C) (Oral)   Ht 6' (1.829 m)   Wt 265 lb (120.2 kg)   SpO2 98%   BMI 35.94 kg/m   Physical Exam Constitutional:      Appearance: Normal appearance.  HENT:     Head: Normocephalic and atraumatic.  Eyes:     General: No scleral icterus. Neck:     Musculoskeletal: Neck supple.  Cardiovascular:     Rate and Rhythm: Normal rate and regular rhythm.  Pulmonary:     Effort: Pulmonary effort is normal.     Breath sounds: Normal breath sounds.  Skin:    General: Skin is warm and dry.     Findings: No rash.  Neurological:     General: No focal deficit present.     Mental Status: He is alert.  Psychiatric:        Mood and Affect: Mood normal.        Behavior: Behavior normal.     ------------------------------------------------------------------------------------------------------------------------------------------------------------------------------------------------------------------- Assessment and Plan  Type 2 diabetes mellitus without complication, without long-term current use of insulin ([place]) -Update a1c -Encouraged  to work on dietary changes.  -Continue metformin   OSA (obstructive sleep apnea) -He has done well with CPAP however current machine is not functioning properly.  Will provide orders for new machine.   Alcohol dependence with alcohol-induced mood disorder ([facility]) -In remission, 5 months sober.   GAD (generalized anxiety disorder) -Some muscle tension due to several factors but otherwise stable with cymbalta.

## 2018-09-10 NOTE — Assessment & Plan Note (Signed)
-  Update a1c -Encouraged to work on dietary changes.  -Continue metformin

## 2018-09-10 NOTE — Assessment & Plan Note (Signed)
-  In remission, 5 months sober.

## 2018-09-10 NOTE — Assessment & Plan Note (Signed)
-  Some muscle tension due to several factors but otherwise stable with cymbalta.

## 2018-09-15 ENCOUNTER — Encounter: Payer: Self-pay | Admitting: Family Medicine

## 2018-10-28 ENCOUNTER — Ambulatory Visit (INDEPENDENT_AMBULATORY_CARE_PROVIDER_SITE_OTHER): Payer: 59 | Admitting: Family Medicine

## 2018-10-28 ENCOUNTER — Encounter: Payer: Self-pay | Admitting: Family Medicine

## 2018-10-28 ENCOUNTER — Other Ambulatory Visit: Payer: Self-pay | Admitting: *Deleted

## 2018-10-28 ENCOUNTER — Other Ambulatory Visit: Payer: Self-pay

## 2018-10-28 DIAGNOSIS — G4733 Obstructive sleep apnea (adult) (pediatric): Secondary | ICD-10-CM

## 2018-10-28 DIAGNOSIS — K602 Anal fissure, unspecified: Secondary | ICD-10-CM | POA: Insufficient documentation

## 2018-10-28 DIAGNOSIS — K629 Disease of anus and rectum, unspecified: Secondary | ICD-10-CM | POA: Insufficient documentation

## 2018-10-28 MED ORDER — NIFEDIPINE POWD: 0 refills | Status: DC

## 2018-10-28 MED ORDER — GEMFIBROZIL 600 MG PO TABS: 600.0000 mg | ORAL_TABLET | Freq: Two times a day (BID) | ORAL | 0 refills | Status: DC

## 2018-10-28 NOTE — Assessment & Plan Note (Signed)
Increased AHI, increase starting pressure to 6cmH20 with max pressure 12cmH20.

## 2018-10-28 NOTE — Progress Notes (Signed)
Patient states original Rx can not be compounded at [facility]- sent to [facility] per patient request.

## 2018-10-28 NOTE — Assessment & Plan Note (Signed)
Fissure as well as hemorrhoid.  Hemorrhoid appears to be resolving.  May continue sitz bath daily.  Rx for nifedipine ointment with lidocaine to be compounded.  Call or f/u if not improving.

## 2018-10-28 NOTE — Progress Notes (Signed)
Tyler Gross - 43 y.o. male MRN [id number]  Date of birth: [birth year]  Subjective Chief Complaint  Patient presents with  . Pain    anal fissures/hemorrhoid- external   ? onset 4 days, pain , OTC /warm baths    HPI Tyler Gross is a 43 y.o. male here today with complaint of rectal pain.  Onset was about 4 days ago.  Has noticed enlarged area next to anus.  Has been using sitz bath with mild improvement.  He has tried OTC suppositories as well.  He has not noticed blood in his stool or with wiping.  He denies fever or chills.  He does admit to frequent diarrhea.    Also received report from [facility] DME company regarding CPAP date.  Having increased AHI from 8-20 events pre night.  Recommend adjusting settings.  ROS:  A comprehensive ROS was completed and negative except as noted per HPI  Allergies  Allergen Reactions  . Atorvastatin Other (See Comments)    Muscle fatigue  . Bee Venom Swelling  . Sulfamethoxazole-Trimethoprim Hives    Bactrim     Past Medical History:  Diagnosis Date  . Anal lesion    nonhealing  . Depression   . ED (erectile dysfunction)   . GAD (generalized anxiety disorder)   . Gynecomastia, male    followed by dr Shellia Cleverly  . High serum estradiol   . History of kidney stones   . Hypertension   . Mixed hyperlipidemia   . OSA on CPAP    per last study 07-31-2013  severe osa  . Pre-diabetes   . Primary hypogonadism in male    endocrinologist-  dr Shellia Cleverly  . Wears glasses     Past Surgical History:  Procedure Laterality Date  . CARDIOVASCULAR STRESS TEST  10/05/2009   normal nuclear study w/ no ischemia/  normal LV function and wall motion , ef 71%  . COLONOSCOPY  last one 2004  . EVALUATION UNDER ANESTHESIA WITH ANAL FISTULECTOMY N/A 02/13/2018   Procedure: ANAL EXAM UNDER ANESTHESIA WITH BIOPSY;  Surgeon: [name], MD;  Location: [facility];  Service: General;  Laterality: N/A;  . ORCHIECTOMY Left 1990   w/ placement  prosthesis (for torsion)  . URETEROLITHOTOMY  1999    Social History   Socioeconomic History  . Marital status: Single    Spouse name: Not on file  . Number of children: Not on file  . Years of education: Not on file  . Highest education level: Not on file  Occupational History  . Not on file  Social Needs  . Financial resource strain: Not on file  . Food insecurity:    Worry: Not on file    Inability: Not on file  . Transportation needs:    Medical: Not on file    Non-medical: Not on file  Tobacco Use  . Smoking status: Former Smoker    Years: 10.00    Types: Cigarettes    Last attempt to quit: 02/07/2009    Years since quitting: 9.7  . Smokeless tobacco: Never Used  Substance and Sexual Activity  . Alcohol use: Yes    Comment: Daily  . Drug use: No  . Sexual activity: Yes    Partners: Male  Lifestyle  . Physical activity:    Days per week: Not on file    Minutes per session: Not on file  . Stress: Not on file  Relationships  . Social connections:    Talks  on phone: Not on file    Gets together: Not on file    Attends religious service: Not on file    Active member of club or organization: Not on file    Attends meetings of clubs or organizations: Not on file    Relationship status: Not on file  Other Topics Concern  . Not on file  Social History Narrative   Lives with partner   Regular exercise: no   Caffeine use: 1 large cup of coffee daily; 2 to 3 sodas in the evening    Family History  Problem Relation Age of Onset  . Cancer Mother        breast cancer: stage I  . Hypertension Mother     Health Maintenance  Topic Date Due  . PNEUMOCOCCAL POLYSACCHARIDE VACCINE AGE 78-64 HIGH RISK  11/16/1976  . FOOT EXAM  11/16/1984  . OPHTHALMOLOGY EXAM  11/16/1984  . HIV Screening  12/21/2018 (Originally 11/16/1989)  . HEMOGLOBIN A1C  03/11/2019  . TETANUS/TDAP  01/27/2024  . INFLUENZA VACCINE  Completed     ----------------------------------------------------------------------------------------------------------------------------------------------------------------------------------------------------------------- Physical Exam BP 126/74   Pulse 78   Temp 98.3 F (36.8 C) (Oral)   Ht 6' (1.829 m)   Wt 258 lb 9.6 oz (117.3 kg)   BMI 35.07 kg/m   Physical Exam Constitutional:      Appearance: Normal appearance.  HENT:     Head: Normocephalic and atraumatic.  Eyes:     General: No scleral icterus. Cardiovascular:     Rate and Rhythm: Normal rate and regular rhythm.  Pulmonary:     Effort: Pulmonary effort is normal.     Breath sounds: Normal breath sounds.  Genitourinary:    Comments: External hemorrhoid, mildly thrombosed with mild tenderness.  DRE without any palpable lesions internally.  Small fissure at ~6 o'clock position without bleeding.  Neurological:     General: No focal deficit present.     Mental Status: He is alert.  Psychiatric:        Mood and Affect: Mood normal.        Behavior: Behavior normal.     ------------------------------------------------------------------------------------------------------------------------------------------------------------------------------------------------------------------- Assessment and Plan  OSA (obstructive sleep apnea) Increased AHI, increase starting pressure to 6cmH20 with max pressure 12cmH20.    Anal fissure Fissure as well as hemorrhoid.  Hemorrhoid appears to be resolving.  May continue sitz bath daily.  Rx for nifedipine ointment with lidocaine to be compounded.  Call or f/u if not improving.

## 2018-10-28 NOTE — Patient Instructions (Signed)
I have sent medication to be compounded to  [facility], [address], [place] [address] Phones: [phone]  Hemorrhoids Hemorrhoids are swollen veins that may develop:  In the butt (rectum). These are called internal hemorrhoids.  Around the opening of the butt (anus). These are called external hemorrhoids. Hemorrhoids can cause pain, itching, or bleeding. Most of the time, they do not cause serious problems. They usually get better with diet changes, lifestyle changes, and other home treatments. What are the causes? This condition may be caused by:  Having trouble pooping (constipation).  Pushing hard (straining) to poop.  Watery poop (diarrhea).  Pregnancy.  Being very overweight (obese).  Sitting for long periods of time.  Heavy lifting or other activity that causes you to strain.  Anal sex.  Riding a bike for a long period of time. What are the signs or symptoms? Symptoms of this condition include:  Pain.  Itching or soreness in the butt.  Bleeding from the butt.  Leaking poop.  Swelling in the area.  One or more lumps around the opening of your butt. How is this diagnosed? A doctor can often diagnose this condition by looking at the affected area. The doctor may also:  Do an exam that involves feeling the area with a gloved hand (digital rectal exam).  Examine the area inside your butt using a small tube (anoscope).  Order blood tests. This may be done if you have lost a lot of blood.  Have you get a test that involves looking inside the colon using a flexible tube with a camera on the end (sigmoidoscopy or colonoscopy). How is this treated? This condition can usually be treated at home. Your doctor may tell you to change what you eat, make lifestyle changes, or try home treatments. If these do not help, procedures can be done to remove the hemorrhoids or make them smaller.  These may involve:  Placing rubber bands at the base of the hemorrhoids to cut off their blood supply.  Injecting medicine into the hemorrhoids to shrink them.  Shining a type of light energy onto the hemorrhoids to cause them to fall off.  Doing surgery to remove the hemorrhoids or cut off their blood supply. Follow these instructions at home: Eating and drinking   Eat foods that have a lot of fiber in them. These include whole grains, beans, nuts, fruits, and vegetables.  Ask your doctor about taking products that have added fiber (fibersupplements).  Reduce the amount of fat in your diet. You can do this by: ? Eating low-fat dairy products. ? Eating less red meat. ? Avoiding processed foods.  Drink enough fluid to keep your pee (urine) pale yellow. Managing pain and swelling   Take a warm-water bath (sitz bath) for 20 minutes to ease pain. Do this 3-4 times a day. You may do this in a bathtub or using a portable sitz bath that fits over the toilet.  If told, put ice on the painful area. It may be helpful to use ice between your warm baths. ? Put ice in a plastic bag. ? Place a towel between your skin and the bag. ? Leave the ice on for 20 minutes, 2-3 times a day. General instructions  Take over-the-counter and prescription medicines only as told by your doctor. ? Medicated creams and medicines may be used as told.  Exercise often. Ask your doctor how much and what kind of  exercise is best for you.  Go to the bathroom when you have the urge to poop. Do not wait.  Avoid pushing too hard when you poop.  Keep your butt dry and clean. Use wet toilet paper or moist towelettes after pooping.  Do not sit on the toilet for a long time.  Keep all follow-up visits as told by your doctor. This is important. Contact a doctor if you:  Have pain and swelling that do not get better with treatment or medicine.  Have trouble pooping.  Cannot poop.  Have pain or swelling  outside the area of the hemorrhoids. Get help right away if you have:  Bleeding that will not stop. Summary  Hemorrhoids are swollen veins in the butt or around the opening of the butt.  They can cause pain, itching, or bleeding.  Eat foods that have a lot of fiber in them. These include whole grains, beans, nuts, fruits, and vegetables.  Take a warm-water bath (sitz bath) for 20 minutes to ease pain. Do this 3-4 times a day. This information is not intended to replace advice given to you by your health care provider. Make sure you discuss any questions you have with your health care provider. Document Released: 05/01/2008 Document Revised: 12/12/2017 Document Reviewed: 12/12/2017 Elsevier Interactive Patient Education  2019 [place] Fissure, Adult  An anal fissure is a small tear or crack in the tissue around the opening of the butt (anus). Bleeding from the tear or crack usually stops on its own within a few minutes. The bleeding may happen every time you poop (have a bowel movement) until the tear or crack heals. What are the causes? This condition is usually caused by passing a large or hard poop (stool). Other causes include:  Trouble pooping (constipation).  Passing watery poop (diarrhea).  Inflammatory bowel disease (Crohn's disease or ulcerative colitis).  Childbirth.  Infections.  Anal sex. What are the signs or symptoms? Symptoms of this condition include:  Bleeding from the butt.  Small amounts of blood on your poop. The blood coats the outside of the poop. It is not mixed with the poop.  Small amounts of blood on the toilet paper or in the toilet after you poop.  Pain when passing poop.  Itching or irritation around the opening of the butt. How is this diagnosed? This condition may be diagnosed based on a physical exam. Your doctor may:  Check your butt. A tear can often be seen by checking the area with care.  Check your butt using a short  tube (anoscope). The light in the tube will show any problems in your butt. How is this treated? Treatment for this condition may include:  Treating problems that make it hard for you to pass poop. You may be told to: ? Eat more fiber. ? Drink more fluid. ? Take fiber supplements. ? Take medicines that make poop soft.  Taking sitz baths. This may help to heal the tear.  Using creams and ointments. If your condition gets worse, other treatments may be needed such as:  A shot near the tear or crack (botulinum injection).  Surgery to repair the tear or crack. Follow these instructions at home: Eating and drinking   Avoid bananas and dairy products. These foods can make it hard to poop.  Drink enough fluid to keep your pee (urine) pale yellow.  Eat foods that have a lot of fiber in them, such as: ? Beans. ? Whole grains. ?  Fresh fruits. ? Fresh vegetables. General instructions   Take over-the-counter and prescription medicines only as told by your doctor.  Use creams or ointments only as told by your doctor.  Keep the butt area as clean and dry as you can.  Take a warm water bath (sitz bath) as told by your doctor. Do not use soap.  Keep all follow-up visits as told by your doctor. This is important. Contact a doctor if:  You have more bleeding.  You have a fever.  You have watery poop that is mixed with blood.  You have pain.  Your problem gets worse, not better. Summary  An anal fissure is a small tear or crack in the skin around the opening of the butt (anus).  This condition is usually caused by passing a large or hard poop (stool).  Treatment includes treating the problems that make it hard for you to pass poop.  Follow your doctor's instructions about caring for your condition at home.  Keep all follow-up visits as told by your doctor. This is important. This information is not intended to replace advice given to you by your health care provider.  Make sure you discuss any questions you have with your health care provider. Document Released: 03/21/2011 Document Revised: 01/02/2018 Document Reviewed: 01/02/2018 Elsevier Interactive Patient Education  2019 [employer].

## 2018-10-29 DIAGNOSIS — G4733 Obstructive sleep apnea (adult) (pediatric): Secondary | ICD-10-CM | POA: Diagnosis not present

## 2018-11-03 ENCOUNTER — Encounter: Payer: Self-pay | Admitting: Family Medicine

## 2018-11-03 DIAGNOSIS — E291 Testicular hypofunction: Secondary | ICD-10-CM

## 2018-11-03 NOTE — Telephone Encounter (Signed)
1. Settings should be able to be adjusted remotely.  Please check with DME company if they received new settings.   2. Please enter new urology referral for hypogonadism for Dr. Alyson Ingles at [facility] urology.   3.Glad to hear hemorrhoid is doing better, continue with current treatment.

## 2018-11-10 ENCOUNTER — Other Ambulatory Visit: Payer: Self-pay | Admitting: Family Medicine

## 2018-11-10 ENCOUNTER — Encounter: Payer: Self-pay | Admitting: Family Medicine

## 2018-11-10 MED ORDER — ROSUVASTATIN CALCIUM 10 MG PO TABS: 10.0000 mg | ORAL_TABLET | Freq: Every day | ORAL | 0 refills | Status: DC

## 2018-11-10 MED ORDER — ATENOLOL 100 MG PO TABS: 100.0000 mg | ORAL_TABLET | Freq: Every morning | ORAL | 0 refills | Status: DC

## 2018-11-10 MED ORDER — AMLODIPINE BESYLATE 5 MG PO TABS: 5.0000 mg | ORAL_TABLET | Freq: Every morning | ORAL | 0 refills | Status: DC

## 2018-11-11 NOTE — Telephone Encounter (Signed)
Please fax referral over to urology since it appears they haven't received it yet.  Thanks!

## 2018-11-13 DIAGNOSIS — E291 Testicular hypofunction: Secondary | ICD-10-CM | POA: Diagnosis not present

## 2018-11-24 DIAGNOSIS — E291 Testicular hypofunction: Secondary | ICD-10-CM | POA: Diagnosis not present

## 2018-11-27 ENCOUNTER — Telehealth: Payer: Self-pay

## 2018-11-27 NOTE — Telephone Encounter (Signed)
Chart notes from [facility] received, given to Dr. Cruzita Lederer for review and sent to scan.

## 2018-11-29 DIAGNOSIS — G4733 Obstructive sleep apnea (adult) (pediatric): Secondary | ICD-10-CM | POA: Diagnosis not present

## 2018-12-07 ENCOUNTER — Other Ambulatory Visit: Payer: Self-pay | Admitting: Family Medicine

## 2018-12-08 MED ORDER — GEMFIBROZIL 600 MG PO TABS: 600.0000 mg | ORAL_TABLET | Freq: Two times a day (BID) | ORAL | 0 refills | Status: DC

## 2018-12-09 ENCOUNTER — Ambulatory Visit: Payer: Self-pay | Admitting: Family Medicine

## 2018-12-19 ENCOUNTER — Other Ambulatory Visit: Payer: Self-pay | Admitting: Family Medicine

## 2018-12-19 ENCOUNTER — Encounter: Payer: Self-pay | Admitting: Family Medicine

## 2018-12-19 MED ORDER — LISINOPRIL-HYDROCHLOROTHIAZIDE 20-25 MG PO TABS: 1.0000 | ORAL_TABLET | Freq: Every day | ORAL | 3 refills | Status: DC

## 2018-12-19 NOTE — Telephone Encounter (Signed)
Rx updated to lisinopril/hctz 20/25mg 

## 2018-12-29 DIAGNOSIS — G4733 Obstructive sleep apnea (adult) (pediatric): Secondary | ICD-10-CM | POA: Diagnosis not present

## 2019-01-05 ENCOUNTER — Encounter: Payer: Self-pay | Admitting: Family Medicine

## 2019-01-05 ENCOUNTER — Other Ambulatory Visit: Payer: Self-pay | Admitting: Family Medicine

## 2019-01-05 MED ORDER — GEMFIBROZIL 600 MG PO TABS: 600.0000 mg | ORAL_TABLET | Freq: Two times a day (BID) | ORAL | 3 refills | Status: DC

## 2019-01-06 ENCOUNTER — Other Ambulatory Visit: Payer: Self-pay | Admitting: Family Medicine

## 2019-01-06 MED ORDER — METFORMIN HCL 500 MG PO TABS: 500.0000 mg | ORAL_TABLET | Freq: Two times a day (BID) | ORAL | 3 refills | Status: DC

## 2019-01-26 ENCOUNTER — Other Ambulatory Visit: Payer: Self-pay | Admitting: Family Medicine

## 2019-01-26 MED ORDER — ROSUVASTATIN CALCIUM 10 MG PO TABS: 10.0000 mg | ORAL_TABLET | Freq: Every day | ORAL | 0 refills | Status: DC

## 2019-02-11 ENCOUNTER — Ambulatory Visit: Payer: Self-pay | Admitting: Family Medicine

## 2019-02-16 ENCOUNTER — Telehealth: Payer: Self-pay

## 2019-02-16 NOTE — Telephone Encounter (Signed)
Questions for Screening COVID-19  Symptom onset: None, Gave # [id number], Pt repeated # back to me.  Travel or Contacts: No  During this illness, did/does the patient experience any of the following symptoms? Fever >100.52F []   Yes [x]   No []   Unknown Subjective fever (felt feverish) []   Yes [x]   No []   Unknown Chills []   Yes [x]   No []   Unknown Muscle aches (myalgia) []   Yes [x]   No []   Unknown Runny nose (rhinorrhea) []   Yes [x]   No []   Unknown Sore throat []   Yes [x]   No []   Unknown Cough (new onset or worsening of chronic cough) []   Yes [x]   No []   Unknown Shortness of breath (dyspnea) []   Yes [x]   No []   Unknown Nausea or vomiting []   Yes [x]   No []   Unknown Headache []   Yes [x]   No []   Unknown Abdominal pain  []   Yes [x]   No []   Unknown Diarrhea (?3 loose/looser than normal stools/24hr period) []   Yes [x]   No []   Unknown Other, specify:  Patient risk factors: Smoker? []   Current []   Former [x]   Never If male, currently pregnant? []   Yes [x]   No  Patient Active Problem List   Diagnosis Date Noted  . Anal fissure 10/28/2018  . Type 2 diabetes mellitus without complication, without long-term current use of insulin ([place]) 06/26/2018  . Alcohol dependence with alcohol-induced mood disorder ([place])   . Severe recurrent major depression without psychotic features ([place]) 04/09/2018  . Depressive disorder 01/24/2018  . Vitamin B deficiency 01/24/2018  . GAD (generalized anxiety disorder) 12/20/2017  . Testicular discomfort 01/17/2017  . Obesity, unspecified 01/09/2017  . Elevated liver enzymes 11/08/2016  . Gynecomastia 01/06/2014  . ED (erectile dysfunction) 01/06/2014  . Estrogen excess 01/06/2014  . Hypogonadism male 06/25/2013  . OSA (obstructive sleep apnea) 05/19/2013  . Hypertriglyceridemia 04/14/2010  . Essential hypertension 04/14/2010  . PALPITATIONS 04/14/2010  . CHEST PAIN-UNSPECIFIED 04/14/2010    Plan:  []   High risk for COVID-19 with red flags go to ED (with  CP, SOB, weak/lightheaded, or fever > 101.5). Call ahead.  []   High risk for COVID-19 but stable. Inform provider and coordinate time for [facility] visit.   [x]   No red flags but URI signs or symptoms okay for [facility] visit.

## 2019-02-17 ENCOUNTER — Ambulatory Visit (INDEPENDENT_AMBULATORY_CARE_PROVIDER_SITE_OTHER): Payer: 59 | Admitting: Family Medicine

## 2019-02-17 ENCOUNTER — Encounter: Payer: Self-pay | Admitting: Family Medicine

## 2019-02-17 VITALS — BP 120/76 | HR 71 | Temp 98.3°F | Resp 18 | Ht 72.0 in | Wt 230.0 lb

## 2019-02-17 DIAGNOSIS — I10 Essential (primary) hypertension: Secondary | ICD-10-CM

## 2019-02-17 DIAGNOSIS — F411 Generalized anxiety disorder: Secondary | ICD-10-CM

## 2019-02-17 DIAGNOSIS — E781 Pure hyperglyceridemia: Secondary | ICD-10-CM

## 2019-02-17 DIAGNOSIS — I872 Venous insufficiency (chronic) (peripheral): Secondary | ICD-10-CM | POA: Insufficient documentation

## 2019-02-17 DIAGNOSIS — G4733 Obstructive sleep apnea (adult) (pediatric): Secondary | ICD-10-CM

## 2019-02-17 DIAGNOSIS — Z23 Encounter for immunization: Secondary | ICD-10-CM | POA: Diagnosis not present

## 2019-02-17 DIAGNOSIS — R197 Diarrhea, unspecified: Secondary | ICD-10-CM | POA: Insufficient documentation

## 2019-02-17 DIAGNOSIS — E119 Type 2 diabetes mellitus without complications: Secondary | ICD-10-CM

## 2019-02-17 LAB — COMPREHENSIVE METABOLIC PANEL
ALT: 19 U/L (ref 0–53)
AST: 18 U/L (ref 0–37)
Albumin: 4.9 g/dL (ref 3.5–5.2)
Alkaline Phosphatase: 59 U/L (ref 39–117)
BUN: 17 mg/dL (ref 6–23)
CO2: 26 mEq/L (ref 19–32)
Calcium: 9.2 mg/dL (ref 8.4–10.5)
Chloride: 105 mEq/L (ref 96–112)
Creatinine, Ser: 1.19 mg/dL (ref 0.40–1.50)
GFR: 66.33 mL/min (ref >60.00)
Glucose, Bld: 113 mg/dL — ABNORMAL HIGH (ref 70–99)
Potassium: 4.2 mEq/L (ref 3.5–5.1)
Sodium: 140 mEq/L (ref 135–145)
Total Bilirubin: 0.7 mg/dL (ref 0.2–1.2)
Total Protein: 7.2 g/dL (ref 6.0–8.3)

## 2019-02-17 LAB — LIPID PANEL
Cholesterol: 113 mg/dL (ref 0–200)
HDL: 36.9 mg/dL — ABNORMAL LOW (ref >39.00)
LDL Cholesterol: 59 mg/dL (ref 0–99)
NonHDL: 75.72
Total CHOL/HDL Ratio: 3
Triglycerides: 86 mg/dL (ref 0.0–149.0)
VLDL: 17.2 mg/dL (ref 0.0–40.0)

## 2019-02-17 LAB — HEMOGLOBIN A1C: Hgb A1c MFr Bld: 5.6 % (ref 4.6–6.5)

## 2019-02-17 MED ORDER — TRIAMCINOLONE ACETONIDE 0.1 % EX CREA: 1.0000 "application " | TOPICAL_CREAM | Freq: Two times a day (BID) | CUTANEOUS | 0 refills | Status: DC

## 2019-02-17 MED ORDER — AMLODIPINE BESYLATE 5 MG PO TABS: 5.0000 mg | ORAL_TABLET | Freq: Every morning | ORAL | 1 refills | Status: DC

## 2019-02-17 NOTE — Assessment & Plan Note (Signed)
-  Chronic, Likely related to IBS, metformin may be contributing as well.  Would be interested in referral to GI but is switching insurances.  He will let me know when new insurance is active.

## 2019-02-17 NOTE — Progress Notes (Signed)
Tyler Gross - 43 y.o. male MRN [id number]  Date of birth: August 25, 1975  Subjective Chief Complaint  Patient presents with  . Follow-up    HTN/A1-C & new issue w/R LL ( shin) area lateral side, bump/mass?     HPI Tyler Gross is a 43 y.o. male wit history of HTN, HLD, T2DM, MDD with anxiety and OSA here today for follow up chronic medical conditions listed.  He has concern of "bump" on R shin today. Area is non painful.  Noticed incidentally while looking at leg one day.  More noticeable with standing.    -HTN:  Current management with amlodipine and lisinopril/HCTZ.  He continues to do well with current medications. Compliant with CPAP.  Denies chest pain, shortness of breath, palpitations,headache or vision change.   -T2DM:  Current treatment with metformin, changed from metformin ER due to recall.  Having increased diarrhea since change.  Also has IBS.  He has not been checking blood sugars recently but is working on weight loss.  Weight down from 258-->230lbs.  Denies polyuria/polydipsia.    -HLD:  Tolerating crestor well.  He denies side effects including myalgias and GI upset with this medication.   -Depression with anxiety: Treated with duloxetine, denies any worsening at this time and is tolerating medication well.    ROS:  A comprehensive ROS was completed and negative except as noted per HPI  Allergies  Allergen Reactions  . Atorvastatin Other (See Comments)    Muscle fatigue  . Bee Venom Swelling  . Sulfamethoxazole-Trimethoprim Hives    Bactrim     Past Medical History:  Diagnosis Date  . Anal lesion    nonhealing  . Depression   . ED (erectile dysfunction)   . GAD (generalized anxiety disorder)   . Gynecomastia, male    followed by dr Shellia Cleverly  . High serum estradiol   . History of kidney stones   . Hypertension   . Mixed hyperlipidemia   . OSA on CPAP    per last study 07-31-2013  severe osa  . Pre-diabetes   . Primary hypogonadism in male    endocrinologist-  dr Shellia Cleverly  . Wears glasses     Past Surgical History:  Procedure Laterality Date  . CARDIOVASCULAR STRESS TEST  10/05/2009   normal nuclear study w/ no ischemia/  normal LV function and wall motion , ef 71%  . COLONOSCOPY  last one 2004  . EVALUATION UNDER ANESTHESIA WITH ANAL FISTULECTOMY N/A 02/13/2018   Procedure: ANAL EXAM UNDER ANESTHESIA WITH BIOPSY;  Surgeon: [name], MD;  Location: [facility];  Service: General;  Laterality: N/A;  . ORCHIECTOMY Left 1990   w/ placement prosthesis (for torsion)  . URETEROLITHOTOMY  1999    Social History   Socioeconomic History  . Marital status: Single    Spouse name: Not on file  . Number of children: Not on file  . Years of education: Not on file  . Highest education level: Not on file  Occupational History  . Not on file  Social Needs  . Financial resource strain: Not on file  . Food insecurity    Worry: Not on file    Inability: Not on file  . Transportation needs    Medical: Not on file    Non-medical: Not on file  Tobacco Use  . Smoking status: Former Smoker    Years: 10.00    Types: Cigarettes    Quit date: 02/07/2009    Years since  quitting: 10.0  . Smokeless tobacco: Never Used  Substance and Sexual Activity  . Alcohol use: Yes    Comment: Daily  . Drug use: No  . Sexual activity: Yes    Partners: Male  Lifestyle  . Physical activity    Days per week: Not on file    Minutes per session: Not on file  . Stress: Not on file  Relationships  . Social Herbalist on phone: Not on file    Gets together: Not on file    Attends religious service: Not on file    Active member of club or organization: Not on file    Attends meetings of clubs or organizations: Not on file    Relationship status: Not on file  Other Topics Concern  . Not on file  Social History Narrative   Lives with partner   Regular exercise: no   Caffeine use: 1 large cup of coffee daily; 2 to 3  sodas in the evening    Family History  Problem Relation Age of Onset  . Cancer Mother        breast cancer: stage I  . Hypertension Mother     Health Maintenance  Topic Date Due  . PNEUMOCOCCAL POLYSACCHARIDE VACCINE AGE 33-64 HIGH RISK  11/16/1976  . FOOT EXAM  11/16/1984  . OPHTHALMOLOGY EXAM  11/16/1984  . INFLUENZA VACCINE  03/07/2019  . HEMOGLOBIN A1C  03/11/2019  . TETANUS/TDAP  01/27/2024  . HIV Screening  Completed    ----------------------------------------------------------------------------------------------------------------------------------------------------------------------------------------------------------------- Physical Exam There were no vitals taken for this visit.  Physical Exam Constitutional:      Appearance: Normal appearance.  HENT:     Head: Normocephalic and atraumatic.  Eyes:     General: No scleral icterus. Neck:     Musculoskeletal: Neck supple.  Cardiovascular:     Rate and Rhythm: Normal rate and regular rhythm.     Pulses: Normal pulses.     Heart sounds: Normal heart sounds.  Pulmonary:     Effort: Pulmonary effort is normal.     Breath sounds: Normal breath sounds.  Musculoskeletal:     Right lower leg: No edema.     Left lower leg: No edema.  Skin:    General: Skin is warm and dry.  Neurological:     General: No focal deficit present.     Mental Status: He is alert.  Psychiatric:        Mood and Affect: Mood normal.        Behavior: Behavior normal.     ------------------------------------------------------------------------------------------------------------------------------------------------------------------------------------------------------------------- Assessment and Plan  Type 2 diabetes mellitus without complication, without long-term current use of insulin ([place]) -Having some increased diarrhea with change to metformin, hopefully diabetes is much better controlled with weight loss.  Discussed if well  controlled we can consider trial off of this.  -Encourage to continue with healthy eating habits and weight loss.   Essential hypertension -BP well controlled, continue current medication.  No dosage change at this time.   OSA (obstructive sleep apnea) Tolerating cpap well, continue.   GAD (generalized anxiety disorder) -Stable, continue duloxetine.   Hypertriglyceridemia -Improved since cessation of EtOH and improvement of blood sugars.  Update lipid panel.   Venous insufficiency -Area on R leg consistent with varicosity.  Recommend continued weight loss and compression stocking if on his feet long term.    Diarrhea -Chronic, Likely related to IBS, metformin may be contributing as well.  Would be interested in referral  to GI but is switching insurances.  He will let me know when new insurance is active.

## 2019-02-17 NOTE — Assessment & Plan Note (Signed)
-  Having some increased diarrhea with change to metformin, hopefully diabetes is much better controlled with weight loss.  Discussed if well controlled we can consider trial off of this.  -Encourage to continue with healthy eating habits and weight loss.

## 2019-02-17 NOTE — Assessment & Plan Note (Signed)
-  BP well controlled, continue current medication.  No dosage change at this time.

## 2019-02-17 NOTE — Assessment & Plan Note (Signed)
-  Stable, continue duloxetine.

## 2019-02-17 NOTE — Patient Instructions (Signed)

## 2019-02-17 NOTE — Assessment & Plan Note (Signed)
-  Area on R leg consistent with varicosity.  Recommend continued weight loss and compression stocking if on his feet long term.

## 2019-02-17 NOTE — Assessment & Plan Note (Signed)
-  Improved since cessation of EtOH and improvement of blood sugars.  Update lipid panel.

## 2019-02-17 NOTE — Assessment & Plan Note (Signed)
Tolerating cpap well, continue.

## 2019-03-12 ENCOUNTER — Encounter: Payer: Self-pay | Admitting: Family Medicine

## 2019-03-16 DIAGNOSIS — F3340 Major depressive disorder, recurrent, in remission, unspecified: Secondary | ICD-10-CM | POA: Diagnosis not present

## 2019-03-16 DIAGNOSIS — F4010 Social phobia, unspecified: Secondary | ICD-10-CM | POA: Diagnosis not present

## 2019-03-16 DIAGNOSIS — F4310 Post-traumatic stress disorder, unspecified: Secondary | ICD-10-CM | POA: Diagnosis not present

## 2019-03-23 ENCOUNTER — Other Ambulatory Visit: Payer: Self-pay | Admitting: Family Medicine

## 2019-03-29 ENCOUNTER — Other Ambulatory Visit: Payer: Self-pay | Admitting: Family Medicine

## 2019-04-02 ENCOUNTER — Telehealth: Payer: Self-pay | Admitting: Family Medicine

## 2019-04-02 NOTE — Telephone Encounter (Signed)

## 2019-04-03 ENCOUNTER — Other Ambulatory Visit: Payer: Self-pay

## 2019-04-03 ENCOUNTER — Ambulatory Visit: Payer: 59 | Admitting: Family Medicine

## 2019-04-03 ENCOUNTER — Encounter: Payer: Self-pay | Admitting: Family Medicine

## 2019-04-03 DIAGNOSIS — M62838 Other muscle spasm: Secondary | ICD-10-CM | POA: Diagnosis not present

## 2019-04-03 MED ORDER — NAPROXEN 500 MG PO TABS: 500.0000 mg | ORAL_TABLET | Freq: Two times a day (BID) | ORAL | 0 refills | Status: DC

## 2019-04-03 MED ORDER — METHOCARBAMOL 750 MG PO TABS: 750.0000 mg | ORAL_TABLET | Freq: Three times a day (TID) | ORAL | 0 refills | Status: DC

## 2019-04-03 NOTE — Progress Notes (Signed)
Tyler Gross - 43 y.o. male MRN [id number]  Date of birth: [birth year]  Subjective Chief Complaint  Patient presents with   Shoulder Pain    pt is c/o of left shoulder and neck pain/1 mo/took otc--not helping    HPI Tyler Gross is a 43 y.o. male here today with complaint of L shoulder pain.  He reports that symptoms started about a month ago.  He was moving some boxes at his mother in laws house and the following day started having pain.  Pain is located adjacent to the neck and radiates to L shoulder area.  He denies weakness, numbness or tingling associated with this.  He has tried several remedies including Ibuprofen, flexeril, muscle rubs and chiropractor without significant improvement.  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   ROS:  A comprehensive ROS was completed and negative except as noted per HPI  Allergies  Allergen Reactions   Atorvastatin Other (See Comments)    Muscle fatigue   Bee Venom Swelling   Sulfamethoxazole-Trimethoprim Hives    Bactrim     Past Medical History:  Diagnosis Date   Anal lesion    nonhealing   Depression    ED (erectile dysfunction)    GAD (generalized anxiety disorder)    Gynecomastia, male    followed by dr Shellia Cleverly   High serum estradiol    History of kidney stones    Hypertension    Mixed hyperlipidemia    OSA on CPAP    per last study 07-31-2013  severe osa   Pre-diabetes    Primary hypogonadism in male    endocrinologist-  dr Shellia Cleverly   Wears glasses     Past Surgical History:  Procedure Laterality Date   CARDIOVASCULAR STRESS TEST  10/05/2009   normal nuclear study w/ no ischemia/  normal LV function and wall motion , ef 71%   COLONOSCOPY  last one 2004   EVALUATION UNDER ANESTHESIA WITH ANAL FISTULECTOMY N/A 02/13/2018   Procedure: ANAL EXAM UNDER ANESTHESIA WITH BIOPSY;  Surgeon: [name], MD;  Location: [facility];   Service: General;  Laterality: N/A;   ORCHIECTOMY Left 1990   w/ placement prosthesis (for torsion)   URETEROLITHOTOMY  1999    Social History   Socioeconomic History   Marital status: Single    Spouse name: Not on file   Number of children: Not on file   Years of education: Not on file   Highest education level: Not on file  Occupational History   Not on file  Social Needs   Financial resource strain: Not on file   Food insecurity    Worry: Not on file    Inability: Not on file   Transportation needs    Medical: Not on file    Non-medical: Not on file  Tobacco Use   Smoking status: Former Smoker    Years: 10.00    Types: Cigarettes    Quit date: 02/07/2009    Years since quitting: 10.1   Smokeless tobacco: Never Used  Substance and Sexual Activity   Alcohol use: Yes    Comment: Daily   Drug use: No   Sexual activity: Yes    Partners: Male  Lifestyle   Physical activity    Days per week: Not on file    Minutes per session: Not on file   Stress: Not on file  Relationships   Social connections  Talks on phone: Not on file    Gets together: Not on file    Attends religious service: Not on file    Active member of club or organization: Not on file    Attends meetings of clubs or organizations: Not on file    Relationship status: Not on file  Other Topics Concern   Not on file  Social History Narrative   Lives with partner   Regular exercise: no   Caffeine use: 1 large cup of coffee daily; 2 to 3 sodas in the evening    Family History  Problem Relation Age of Onset   Cancer Mother        breast cancer: stage I   Hypertension Mother     Health Maintenance  Topic Date Due   FOOT EXAM  11/16/1984   OPHTHALMOLOGY EXAM  11/16/1984   INFLUENZA VACCINE  03/07/2019   HEMOGLOBIN A1C  08/20/2019   TETANUS/TDAP  01/27/2024   PNEUMOCOCCAL POLYSACCHARIDE VACCINE AGE 56-64 HIGH RISK  Completed   HIV Screening  Completed     ----------------------------------------------------------------------------------------------------------------------------------------------------------------------------------------------------------------- Physical Exam BP 104/70    Pulse 68    Temp (!) 96.9 F (36.1 C) (Tympanic)    Ht 6' (1.829 m)    Wt 227 lb (103 kg)    SpO2 98%    BMI 30.79 kg/m   Physical Exam Constitutional:      Appearance: Normal appearance.  HENT:     Head: Normocephalic and atraumatic.     Mouth/Throat:     Mouth: Mucous membranes are moist.  Eyes:     General: No scleral icterus. Neck:     Musculoskeletal: Normal range of motion.     Comments: TTP along levator scapulae on the L.  Cardiovascular:     Rate and Rhythm: Normal rate and regular rhythm.  Pulmonary:     Effort: Pulmonary effort is normal.     Breath sounds: Normal breath sounds.  Musculoskeletal:     Comments: L shoulder normal to inspection and palpation.  ROM of shoulder is normal.  Strength is normal.  No signs of impingement.    Skin:    General: Skin is warm and dry.     Findings: No rash.  Neurological:     General: No focal deficit present.     Mental Status: He is alert.     Motor: No weakness.     ------------------------------------------------------------------------------------------------------------------------------------------------------------------------------------------------------------------- Assessment and Plan  Muscle spasm Based on exam he seems to have spasm of levator scapulae.  Neural impingement could also be contributing given radiation to arm.  Recommend trial of steroid, however he declines a this time.  Will start naproxen 500mg  bid and give trial of robaxin.  Recommend trying portable TENS to area.   If not improving will refer to sports medicine to consider trigger point injection

## 2019-04-03 NOTE — Patient Instructions (Signed)
Continue home stretches Try naproxen and robaxin over the weekend.  Try laying on tennis ball or place between yourself and a wall.  Pick up a TENS unit Let me know how things are going next week.

## 2019-04-04 ENCOUNTER — Other Ambulatory Visit: Payer: Self-pay | Admitting: Family Medicine

## 2019-04-04 MED ORDER — METHYLPREDNISOLONE 4 MG PO TBPK: ORAL_TABLET | ORAL | 0 refills | Status: DC

## 2019-04-05 ENCOUNTER — Encounter: Payer: Self-pay | Admitting: Family Medicine

## 2019-04-05 DIAGNOSIS — M62838 Other muscle spasm: Secondary | ICD-10-CM | POA: Insufficient documentation

## 2019-04-05 NOTE — Assessment & Plan Note (Signed)
Based on exam he seems to have spasm of levator scapulae.  Neural impingement could also be contributing given radiation to arm.  Recommend trial of steroid, however he declines a this time.  Will start naproxen 500mg  bid and give trial of robaxin.  Recommend trying portable TENS to area.   If not improving will refer to sports medicine to consider trigger point injection

## 2019-04-14 ENCOUNTER — Other Ambulatory Visit: Payer: Self-pay | Admitting: Family Medicine

## 2019-04-29 ENCOUNTER — Ambulatory Visit: Payer: 59 | Admitting: Family Medicine

## 2019-04-29 ENCOUNTER — Encounter: Payer: Self-pay | Admitting: Family Medicine

## 2019-04-29 ENCOUNTER — Other Ambulatory Visit: Payer: Self-pay

## 2019-04-29 VITALS — BP 110/70 | HR 74 | Temp 98.6°F | Ht 72.0 in | Wt 229.6 lb

## 2019-04-29 DIAGNOSIS — M25512 Pain in left shoulder: Secondary | ICD-10-CM | POA: Insufficient documentation

## 2019-04-29 DIAGNOSIS — K629 Disease of anus and rectum, unspecified: Secondary | ICD-10-CM

## 2019-04-29 DIAGNOSIS — K6289 Other specified diseases of anus and rectum: Secondary | ICD-10-CM | POA: Diagnosis not present

## 2019-04-29 NOTE — Assessment & Plan Note (Signed)
-  Referral back to surgery for follow up of this area as lesion remains bothersome.  -He will continue nifedipine gel for now as well.

## 2019-04-29 NOTE — Progress Notes (Signed)
Tyler Gross - 43 y.o. male MRN [id number]  Date of birth: [birth year]  Subjective Chief Complaint  Patient presents with  . Pain    pain after BM/ 2 weeks/ little bleeding/ left shoulder pain first 2 fingers tingling     HPI Tyler Gross is a 43 y.o. male here today with complaint of rectal pain and L shoulder pain.   -Rectal/anal pain:  Has had rectal pain for several months.  Previous evaluation of anal lesion by Dr. 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  Area continues to be bothersome.  Pain is worse after bowel movements and last for several hours.   He does have some bleeding sometimes. He does get relief from compounded nifedipine cream but never fully resolves. Bowel movements have been normal recently.  He denies any recent receptive anal intercourse.    -Shoulder pain:  Shoulder pain has improved but not fully resolved.  He also has noticed some occasional tingling into thumb and first finger on L hand.  He denies weakness of the arm.   ROS:  A comprehensive ROS was completed and negative except as noted per HPI    Allergies  Allergen Reactions  . Atorvastatin Other (See Comments)    Muscle fatigue  . Bee Venom Swelling  . Sulfamethoxazole-Trimethoprim Hives    Bactrim     Past Medical History:  Diagnosis Date  . Anal lesion    nonhealing  . Depression   . ED (erectile dysfunction)   . GAD (generalized anxiety disorder)   . Gynecomastia, male    followed by dr Shellia Cleverly  . High serum estradiol   . History of kidney stones   . Hypertension   . Mixed hyperlipidemia   . OSA on CPAP    per last study 07-31-2013  severe osa  . Pre-diabetes   . Primary hypogonadism in male    endocrinologist-  dr Shellia Cleverly  . Wears glasses     Past Surgical History:  Procedure Laterality Date  . CARDIOVASCULAR STRESS TEST  10/05/2009   normal nuclear study w/ no ischemia/  normal LV function and wall motion , ef 71%  . COLONOSCOPY  last one 2004   . EVALUATION UNDER ANESTHESIA WITH ANAL FISTULECTOMY N/A 02/13/2018   Procedure: ANAL EXAM UNDER ANESTHESIA WITH BIOPSY;  Surgeon: [name], MD;  Location: [facility];  Service: General;  Laterality: N/A;  . ORCHIECTOMY Left 1990   w/ placement prosthesis (for torsion)  . URETEROLITHOTOMY  1999    Social History   Socioeconomic History  . Marital status: Single    Spouse name: Not on file  . Number of children: Not on file  . Years of education: Not on file  . Highest education level: Not on file  Occupational History  . Not on file  Social Needs  . Financial resource strain: Not on file  . Food insecurity    Worry: Not on file    Inability: Not on file  . Transportation needs    Medical: Not on file    Non-medical: Not on file  Tobacco Use  . Smoking status: Former Smoker    Years: 10.00    Types: Cigarettes    Quit date: 02/07/2009    Years since quitting: 10.2  . Smokeless tobacco: Never Used  Substance and Sexual Activity  . Alcohol use: Yes    Comment: Daily  . Drug use: No  . Sexual activity: Yes  Partners: Male  Lifestyle  . Physical activity    Days per week: Not on file    Minutes per session: Not on file  . Stress: Not on file  Relationships  . Social Herbalist on phone: Not on file    Gets together: Not on file    Attends religious service: Not on file    Active member of club or organization: Not on file    Attends meetings of clubs or organizations: Not on file    Relationship status: Not on file  Other Topics Concern  . Not on file  Social History Narrative   Lives with partner   Regular exercise: no   Caffeine use: 1 large cup of coffee daily; 2 to 3 sodas in the evening    Family History  Problem Relation Age of Onset  . Cancer Mother        breast cancer: stage I  . Hypertension Mother     Health Maintenance  Topic Date Due  . FOOT EXAM  11/16/1984  . OPHTHALMOLOGY EXAM  11/16/1984  . INFLUENZA  VACCINE  03/07/2019  . HEMOGLOBIN A1C  08/20/2019  . TETANUS/TDAP  01/27/2024  . PNEUMOCOCCAL POLYSACCHARIDE VACCINE AGE 52-64 HIGH RISK  Completed  . HIV Screening  Completed    ----------------------------------------------------------------------------------------------------------------------------------------------------------------------------------------------------------------- Physical Exam BP 110/70   Pulse 74   Temp 98.6 F (37 C) (Oral)   Ht 6' (1.829 m)   Wt 229 lb 9.6 oz (104.1 kg)   SpO2 98%   BMI 31.14 kg/m   Physical Exam Constitutional:      Appearance: Normal appearance.  HENT:     Head: Normocephalic and atraumatic.  Eyes:     General: No scleral icterus. Neck:     Musculoskeletal: Neck supple.  Cardiovascular:     Rate and Rhythm: Normal rate and regular rhythm.  Pulmonary:     Effort: Pulmonary effort is normal.     Breath sounds: Normal breath sounds.  Genitourinary:    Comments: Lesion remains at ~6 o'clock area.  Anoscopy exam completed and unremarkable.  No masses noted.   Neurological:     General: No focal deficit present.     Mental Status: He is alert.  Psychiatric:        Mood and Affect: Mood normal.        Behavior: Behavior normal.     ------------------------------------------------------------------------------------------------------------------------------------------------------------------------------------------------------------------- Assessment and Plan  Anal lesion -Referral back to surgery for follow up of this area as lesion remains bothersome.  -He will continue nifedipine gel for now as well.   Acute pain of left shoulder -Discussed referral to sports med vs PT.  He would like to try sports med initially.  Trigger point injection may help with continue levator spasm.

## 2019-04-29 NOTE — Assessment & Plan Note (Signed)
-  Discussed referral to sports med vs PT.  He would like to try sports med initially.  Trigger point injection may help with continue levator spasm.

## 2019-04-29 NOTE — Patient Instructions (Signed)
Continue to use compounded cream I have entered referral to return to surgeon and sports med.

## 2019-05-01 DIAGNOSIS — G4733 Obstructive sleep apnea (adult) (pediatric): Secondary | ICD-10-CM | POA: Diagnosis not present

## 2019-05-12 ENCOUNTER — Other Ambulatory Visit: Payer: Self-pay

## 2019-05-12 ENCOUNTER — Ambulatory Visit: Payer: 59 | Admitting: Family Medicine

## 2019-05-12 ENCOUNTER — Encounter: Payer: Self-pay | Admitting: Family Medicine

## 2019-05-12 VITALS — BP 126/72 | HR 71 | Ht 72.0 in | Wt 229.0 lb

## 2019-05-12 DIAGNOSIS — M5412 Radiculopathy, cervical region: Secondary | ICD-10-CM | POA: Diagnosis not present

## 2019-05-12 DIAGNOSIS — E291 Testicular hypofunction: Secondary | ICD-10-CM | POA: Diagnosis not present

## 2019-05-12 NOTE — Patient Instructions (Signed)
Nice to meet you Please try heat on the upper back  Please try physical therapy. They will give you a call.   Please send me a message in MyChart with any questions or updates.  Please see me back in 4 weeks.   --Dr. Raeford Razor

## 2019-05-12 NOTE — Assessment & Plan Note (Signed)
Symptoms seem radicular in nature.  Could have a component of spasm in the left upper back that could have been his initial pain that has since resolved.  Does have changes in the hand which could represent radiculopathy versus carpal tunnel.  Has done medications and massage up to this point. -Counseled on home exercise therapy and supportive care. -Referral to physical therapy. -If no improvement can consider a carpal tunnel injection

## 2019-05-12 NOTE — Progress Notes (Signed)
Tyler Gross - 43 y.o. male MRN [id number]  Date of birth: [birth year]  SUBJECTIVE:  Including CC & ROS.  Chief Complaint  Patient presents with  . Shoulder Pain    left shoulder    Tyler Gross is a 43 y.o. male that is presenting with left shoulder and arm pain.  His symptoms have been ongoing for about 2 months.  He initially felt significant pain in the left upper back in the superior aspect of the scapula.  He has been through a muscle relaxer and steroid and finally that pain seems to have improved.  He also tried massage.  Now he experiences some pain down the arm and into his hand.  He is having some altered sensation that he experiences.  This is intermittent in nature.  He feels it in the thumb and index finger of the hand.  He felt his symptoms initially presented after putting boxes up in the garage after he had to move.  Denies any history of similar symptoms or surgery.    Review of Systems  Constitutional: Negative for fever.  HENT: Negative for congestion.   Respiratory: Negative for cough.   Cardiovascular: Negative for chest pain.  Gastrointestinal: Negative for abdominal pain.  Musculoskeletal: Positive for back pain.  Skin: Negative for color change.  Neurological: Negative for weakness.  Hematological: Negative for adenopathy.    HISTORY: Past Medical, Surgical, Social, and Family History Reviewed & Updated per EMR.   Pertinent Historical Findings include:  Past Medical History:  Diagnosis Date  . Anal lesion    nonhealing  . Depression   . ED (erectile dysfunction)   . GAD (generalized anxiety disorder)   . Gynecomastia, male    followed by dr Shellia Cleverly  . High serum estradiol   . History of kidney stones   . Hypertension   . Mixed hyperlipidemia   . OSA on CPAP    per last study 07-31-2013  severe osa  . Pre-diabetes   . Primary hypogonadism in male    endocrinologist-  dr Shellia Cleverly  . Wears glasses     Past Surgical History:  Procedure  Laterality Date  . CARDIOVASCULAR STRESS TEST  10/05/2009   normal nuclear study w/ no ischemia/  normal LV function and wall motion , ef 71%  . COLONOSCOPY  last one 2004  . EVALUATION UNDER ANESTHESIA WITH ANAL FISTULECTOMY N/A 02/13/2018   Procedure: ANAL EXAM UNDER ANESTHESIA WITH BIOPSY;  Surgeon: [name], MD;  Location: [facility];  Service: General;  Laterality: N/A;  . ORCHIECTOMY Left 1990   w/ placement prosthesis (for torsion)  . URETEROLITHOTOMY  1999    Allergies  Allergen Reactions  . Atorvastatin Other (See Comments)    Muscle fatigue  . Bee Venom Swelling  . Sulfamethoxazole-Trimethoprim Hives    Bactrim     Family History  Problem Relation Age of Onset  . Cancer Mother        breast cancer: stage I  . Hypertension Mother      Social History   Socioeconomic History  . Marital status: Single    Spouse name: Not on file  . Number of children: Not on file  . Years of education: Not on file  . Highest education level: Not on file  Occupational History  . Not on file  Social Needs  . Financial resource strain: Not on file  . Food insecurity    Worry: Not on file    Inability: Not  on file  . Transportation needs    Medical: Not on file    Non-medical: Not on file  Tobacco Use  . Smoking status: Former Smoker    Years: 10.00    Types: Cigarettes    Quit date: 02/07/2009    Years since quitting: 10.2  . Smokeless tobacco: Never Used  Substance and Sexual Activity  . Alcohol use: Yes    Comment: Daily  . Drug use: No  . Sexual activity: Yes    Partners: Male  Lifestyle  . Physical activity    Days per week: Not on file    Minutes per session: Not on file  . Stress: Not on file  Relationships  . Social Herbalist on phone: Not on file    Gets together: Not on file    Attends religious service: Not on file    Active member of club or organization: Not on file    Attends meetings of clubs or organizations: Not on  file    Relationship status: Not on file  . Intimate partner violence    Fear of current or ex partner: Not on file    Emotionally abused: Not on file    Physically abused: Not on file    Forced sexual activity: Not on file  Other Topics Concern  . Not on file  Social History Narrative   Lives with partner   Regular exercise: no   Caffeine use: 1 large cup of coffee daily; 2 to 3 sodas in the evening     PHYSICAL EXAM:  VS: BP 126/72   Pulse 71   Ht 6' (1.829 m)   Wt 229 lb (103.9 kg)   BMI 31.06 kg/m  Physical Exam Gen: NAD, alert, cooperative with exam, well-appearing ENT: normal lips, normal nasal mucosa,  Eye: normal EOM, normal conjunctiva and lids CV:  no edema, +2 pedal pulses   Resp: no accessory muscle use, non-labored,  Skin: no rashes, no areas of induration  Neuro: normal tone, normal sensation to touch Psych:  normal insight, alert and oriented MSK:  Neck: No tenderness to palpation over the left trapezius. Normal strength resistance with shrug. Normal lateral rotation Normal flexion and extension. Negative Spurling's test. No winging of the scapula. Left arm: Normal shoulder internal and external rotation. No pain with external rotation and abduction. Normal empty can testing. Left hand: No signs of atrophy. Normal strength resistance with finger abduction abduction. Normal pincer grasp. Normal grip strength. Negative Tinel's at the wrist. Neurovascular intact     ASSESSMENT & PLAN:   Cervical radiculopathy Symptoms seem radicular in nature.  Could have a component of spasm in the left upper back that could have been his initial pain that has since resolved.  Does have changes in the hand which could represent radiculopathy versus carpal tunnel.  Has done medications and massage up to this point. -Counseled on home exercise therapy and supportive care. -Referral to physical therapy. -If no improvement can consider a carpal tunnel injection

## 2019-05-14 ENCOUNTER — Other Ambulatory Visit: Payer: Self-pay | Admitting: Family Medicine

## 2019-05-22 DIAGNOSIS — N522 Drug-induced erectile dysfunction: Secondary | ICD-10-CM | POA: Diagnosis not present

## 2019-05-22 DIAGNOSIS — E291 Testicular hypofunction: Secondary | ICD-10-CM | POA: Diagnosis not present

## 2019-05-31 DIAGNOSIS — G4733 Obstructive sleep apnea (adult) (pediatric): Secondary | ICD-10-CM | POA: Diagnosis not present

## 2019-06-10 DIAGNOSIS — K602 Anal fissure, unspecified: Secondary | ICD-10-CM | POA: Diagnosis not present

## 2019-06-15 ENCOUNTER — Other Ambulatory Visit: Payer: Self-pay | Admitting: Family Medicine

## 2019-07-01 DIAGNOSIS — G4733 Obstructive sleep apnea (adult) (pediatric): Secondary | ICD-10-CM | POA: Diagnosis not present

## 2019-07-23 DIAGNOSIS — F4310 Post-traumatic stress disorder, unspecified: Secondary | ICD-10-CM | POA: Diagnosis not present

## 2019-07-23 DIAGNOSIS — F3340 Major depressive disorder, recurrent, in remission, unspecified: Secondary | ICD-10-CM | POA: Diagnosis not present

## 2019-07-23 DIAGNOSIS — F4010 Social phobia, unspecified: Secondary | ICD-10-CM | POA: Diagnosis not present

## 2019-08-10 ENCOUNTER — Other Ambulatory Visit: Payer: Self-pay

## 2019-08-10 ENCOUNTER — Other Ambulatory Visit: Payer: Self-pay | Admitting: Family Medicine

## 2019-08-10 DIAGNOSIS — I10 Essential (primary) hypertension: Secondary | ICD-10-CM

## 2019-08-11 ENCOUNTER — Other Ambulatory Visit: Payer: Self-pay | Admitting: Family Medicine

## 2019-08-11 DIAGNOSIS — I10 Essential (primary) hypertension: Secondary | ICD-10-CM

## 2019-08-18 ENCOUNTER — Other Ambulatory Visit: Payer: Self-pay

## 2019-08-19 ENCOUNTER — Encounter: Payer: Self-pay | Admitting: Gastroenterology

## 2019-08-19 ENCOUNTER — Encounter: Payer: Self-pay | Admitting: Family Medicine

## 2019-08-19 ENCOUNTER — Ambulatory Visit: Payer: 59 | Admitting: Family Medicine

## 2019-08-19 VITALS — BP 126/82 | HR 69 | Temp 97.0°F | Ht 73.0 in | Wt 255.4 lb

## 2019-08-19 DIAGNOSIS — L700 Acne vulgaris: Secondary | ICD-10-CM | POA: Diagnosis not present

## 2019-08-19 DIAGNOSIS — R197 Diarrhea, unspecified: Secondary | ICD-10-CM

## 2019-08-19 DIAGNOSIS — I10 Essential (primary) hypertension: Secondary | ICD-10-CM

## 2019-08-19 DIAGNOSIS — E781 Pure hyperglyceridemia: Secondary | ICD-10-CM

## 2019-08-19 DIAGNOSIS — K602 Anal fissure, unspecified: Secondary | ICD-10-CM | POA: Diagnosis not present

## 2019-08-19 DIAGNOSIS — E119 Type 2 diabetes mellitus without complications: Secondary | ICD-10-CM

## 2019-08-19 DIAGNOSIS — L709 Acne, unspecified: Secondary | ICD-10-CM | POA: Insufficient documentation

## 2019-08-19 LAB — LIPID PANEL
Cholesterol: 143 mg/dL (ref 0–200)
HDL: 42.8 mg/dL (ref >39.00)
LDL Cholesterol: 77 mg/dL (ref 0–99)
NonHDL: 100.38
Total CHOL/HDL Ratio: 3
Triglycerides: 119 mg/dL (ref 0.0–149.0)
VLDL: 23.8 mg/dL (ref 0.0–40.0)

## 2019-08-19 LAB — HEMOGLOBIN A1C: Hgb A1c MFr Bld: 6.1 % (ref 4.6–6.5)

## 2019-08-19 MED ORDER — DOXYCYCLINE HYCLATE 100 MG PO TABS: 100.0000 mg | ORAL_TABLET | Freq: Every day | ORAL | 0 refills | Status: DC

## 2019-08-19 MED ORDER — NIFEDIPINE POWD: 1 refills | Status: DC

## 2019-08-19 NOTE — Assessment & Plan Note (Signed)
BP is well controlled, continue current medication.   Follow low salt diet.  Counseled on weight loss .

## 2019-08-19 NOTE — Assessment & Plan Note (Signed)
He has done well with daily doxycycline in the past for management of this, will restart.

## 2019-08-19 NOTE — Assessment & Plan Note (Signed)
Nifedipine ointment renewed

## 2019-08-19 NOTE — Progress Notes (Signed)
Tyler Gross - 44 y.o. male MRN [id number]  Date of birth: 10-Dec-1975  Subjective Chief Complaint  Patient presents with  . Acne    c/o back acne very itchy and irritated come and go becoming worse x 1 month, rectal beeling x 6 month come and go last time noticed 08/09/19.    HPI Tyler Gross is a 44 y.o. male here today with complaint of rash on back and continued GI issues.  He is also following up for HTN and DM.  -Rash:  Reports acne like rash on his back.  Has had off and on for several years.  Worse if sweating/hot.  Treated with doxycycline daily in the past and this worked well for him.    -GI problem:  Recurrent issues with diarrhea and occasional constipation.  He also has had problems with anal fissures and rectal bleeding.  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  He reports having colonoscopy in the past with findings of possible Crohn's disease.  He is on metformin for diabetes but stopping this did not really improve his symptoms.    -HTN:  Current tx with lisinopril/hctz, amlodipine and atenolol.  He is doing well with current medications.  He denies side effects of symptoms of hypotension.  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  He denies chest pain, shortness of breath, palpitations, headache or vision changes.   -DM:  Current management with metformin XR 500mg  daily.  He is doing well with this.  He has gained some weight over the past couple of months.  He walks a lot at work.  He denies any symptoms related to his diabetes.   ROS:  A comprehensive ROS was completed and negative except as noted per HPI  Allergies  Allergen Reactions  . Atorvastatin Other (See Comments)    Muscle fatigue  . Bee Venom Swelling  . Sulfamethoxazole-Trimethoprim Hives    Bactrim     Past Medical History:  Diagnosis Date  . Anal lesion    nonhealing  . Depression   . ED  (erectile dysfunction)   . GAD (generalized anxiety disorder)   . Gynecomastia, male    followed by dr Shellia Cleverly  . High serum estradiol   . History of kidney stones   . Hypertension   . Mixed hyperlipidemia   . OSA on CPAP    per last study 07-31-2013  severe osa  . Pre-diabetes   . Primary hypogonadism in male    endocrinologist-  dr Shellia Cleverly  . Wears glasses     Past Surgical History:  Procedure Laterality Date  . CARDIOVASCULAR STRESS TEST  10/05/2009   normal nuclear study w/ no ischemia/  normal LV function and wall motion , ef 71%  . COLONOSCOPY  last one 2004  . EVALUATION UNDER ANESTHESIA WITH ANAL FISTULECTOMY N/A 02/13/2018   Procedure: ANAL EXAM UNDER ANESTHESIA WITH BIOPSY;  Surgeon: [name], MD;  Location: [facility];  Service: General;  Laterality: N/A;  . ORCHIECTOMY Left 1990   w/ placement prosthesis (for torsion)  . URETEROLITHOTOMY  1999    Social History   Socioeconomic History  . Marital status: Single    Spouse name: Not on file  . Number of children: Not on file  . Years of education: Not on file  . Highest education level: Not on file  Occupational History  .  Not on file  Tobacco Use  . Smoking status: Former Smoker    Years: 10.00    Types: Cigarettes    Quit date: 02/07/2009    Years since quitting: 10.5  . Smokeless tobacco: Never Used  Substance and Sexual Activity  . Alcohol use: Yes    Comment: Daily  . Drug use: No  . Sexual activity: Yes    Partners: Male  Other Topics Concern  . Not on file  Social History Narrative   Lives with partner   Regular exercise: no   Caffeine use: 1 large cup of coffee daily; 2 to 3 sodas in the evening   Social Determinants of Health   Financial Resource Strain:   . Difficulty of Paying Living Expenses: Not on file  Food Insecurity:   . Worried About Charity fundraiser in the Last Year: Not on file  . Ran Out of Food in the Last Year: Not on file  Transportation Needs:   .  Lack of Transportation (Medical): Not on file  . Lack of Transportation (Non-Medical): Not on file  Physical Activity:   . Days of Exercise per Week: Not on file  . Minutes of Exercise per Session: Not on file  Stress:   . Feeling of Stress : Not on file  Social Connections:   . Frequency of Communication with Friends and Family: Not on file  . Frequency of Social Gatherings with Friends and Family: Not on file  . Attends Religious Services: Not on file  . Active Member of Clubs or Organizations: Not on file  . Attends Archivist Meetings: Not on file  . Marital Status: Not on file    Family History  Problem Relation Age of Onset  . Cancer Mother        breast cancer: stage I  . Hypertension Mother     Health Maintenance  Topic Date Due  . FOOT EXAM  11/16/1984  . OPHTHALMOLOGY EXAM  11/16/1984  . HEMOGLOBIN A1C  08/20/2019  . TETANUS/TDAP  01/27/2024  . INFLUENZA VACCINE  Completed  . PNEUMOCOCCAL POLYSACCHARIDE VACCINE AGE 98-64 HIGH RISK  Completed  . HIV Screening  Completed    ----------------------------------------------------------------------------------------------------------------------------------------------------------------------------------------------------------------- Physical Exam BP 126/82   Pulse 69   Temp (!) 97 F (36.1 C) (Tympanic)   Ht 6\' 1"  (1.854 m)   Wt 255 lb 6.4 oz (115.8 kg)   SpO2 98%   BMI 33.70 kg/m   Physical Exam Constitutional:      Appearance: Normal appearance.  HENT:     Head: Normocephalic and atraumatic.     Mouth/Throat:     Mouth: Mucous membranes are moist.  Eyes:     General: No scleral icterus. Cardiovascular:     Rate and Rhythm: Normal rate and regular rhythm.  Pulmonary:     Effort: Pulmonary effort is normal.     Breath sounds: Normal breath sounds.  Musculoskeletal:     Cervical back: Neck supple.  Skin:    General: Skin is warm and dry.     Comments: Several inflamed comedones/pustules on  back and upper shoulders.   Neurological:     General: No focal deficit present.     Mental Status: He is alert.  Psychiatric:        Behavior: Behavior normal.     ------------------------------------------------------------------------------------------------------------------------------------------------------------------------------------------------------------------- Assessment and Plan  Essential hypertension BP is well controlled, continue current medication.   Follow low salt diet.  Counseled on weight loss .  Type 2 diabetes mellitus without complication, without long-term current use of insulin ([place]) Doing well with metformin, update a1c today.   Diarrhea Pattern seems consistent with IBS mostly diarrhea predominant.  However having recurrent ano-rectal problems and questionable history of Crohn's will refer to GI.    Anal fissure Nifedipine ointment renewed  Acne He has done well with daily doxycycline in the past for management of this, will restart.     This visit occurred during the SARS-CoV-2 public health emergency.  Safety protocols were in place, including screening questions prior to the visit, additional usage of staff PPE, and extensive cleaning of exam room while observing appropriate contact time as indicated for disinfecting solutions.

## 2019-08-19 NOTE — Assessment & Plan Note (Signed)
Doing well with metformin, update a1c today.

## 2019-08-19 NOTE — Assessment & Plan Note (Signed)
Pattern seems consistent with IBS mostly diarrhea predominant.  However having recurrent ano-rectal problems and questionable history of Crohn's will refer to GI.

## 2019-08-19 NOTE — Patient Instructions (Signed)
You should hear from GI soon

## 2019-09-07 ENCOUNTER — Other Ambulatory Visit: Payer: Self-pay | Admitting: Family Medicine

## 2019-09-07 NOTE — Telephone Encounter (Signed)
Last fill for Klor-con  05/14/19  #90/1 Last fill for Crestor  03/23/19  #60/3 Last OV 08/19/19

## 2019-09-14 ENCOUNTER — Ambulatory Visit: Payer: 59 | Admitting: Gastroenterology

## 2019-09-14 ENCOUNTER — Other Ambulatory Visit: Payer: Self-pay

## 2019-09-14 ENCOUNTER — Encounter: Payer: Self-pay | Admitting: Gastroenterology

## 2019-09-14 VITALS — BP 124/78 | HR 75 | Temp 97.1°F | Ht 72.0 in | Wt 256.2 lb

## 2019-09-14 DIAGNOSIS — K921 Melena: Secondary | ICD-10-CM | POA: Diagnosis not present

## 2019-09-14 DIAGNOSIS — K5900 Constipation, unspecified: Secondary | ICD-10-CM | POA: Diagnosis not present

## 2019-09-14 DIAGNOSIS — R12 Heartburn: Secondary | ICD-10-CM | POA: Diagnosis not present

## 2019-09-14 DIAGNOSIS — K602 Anal fissure, unspecified: Secondary | ICD-10-CM | POA: Diagnosis not present

## 2019-09-14 DIAGNOSIS — Z01818 Encounter for other preprocedural examination: Secondary | ICD-10-CM

## 2019-09-14 DIAGNOSIS — K219 Gastro-esophageal reflux disease without esophagitis: Secondary | ICD-10-CM | POA: Diagnosis not present

## 2019-09-14 DIAGNOSIS — R1310 Dysphagia, unspecified: Secondary | ICD-10-CM

## 2019-09-14 MED ORDER — CLENPIQ 10-3.5-12 MG-GM -GM/160ML PO SOLN: 1.0000 | Freq: Once | ORAL | 0 refills | Status: AC

## 2019-09-14 NOTE — Progress Notes (Signed)
Chief Complaint: Anal fissure, constipation, change in bowel habits  Referring Provider:     Luetta Nutting, DO   HPI:    Tyler Gross is a 44 y.o. male with a history of GAD, diabetes, IBS, OSA (on CPAP), hypertension, referred to the [facility] for evaluation of anal fissure and change in bowel habits.  He provides a good history of his previous GI work-up as follows: In 2003/2004 developed abdominal pain and was admitted with SBO, treated with rest only (no NGT or surgery).  Colonoscopy at that time in [place], [place] with suspicion for Crohn's Disease.  Treated with Remicade (only 3 infusions) and steroids.  Symptoms improved, and followed up with a different GI, and was felt this was not Crohn's Disease and was diagnosed with Microscopic Colitis on a second colonoscopy.  Symptoms eventually resolved.  No colonoscopy since then.  Diagnosed with anal fissure and 2019.  Referred to Dr. [name] at [facility], with EUA in 02/2018 n/f anal fissure.  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  Was treated with topical nifedipine with lidocaine with clinical improvement.  More recently, has again developed constipation and hematochezia, along with dyschezia. Constipation now over the last 2+ months, and causes dyschezia and hematochezia. +straining. Colace BID, but still with hard, pellet like stools. BRB on tissue paper and outside of stool.  Has restarted nifedipine with lidocaine with improvement in pain, but hematochezoia remains.   Separately, prior hx of HB- treated with pantoprazole with improvement and has since weaned off. No rare sxs.  Does have intermittent pill dysphagia.  No history of food impactions.  No prior EGD.    Past Medical History:  Diagnosis Date  . Alcoholism ([place])    sober since 04/2018  . Anal fissure   . Anal lesion    nonhealing  . Anxiety   .  Depression   . ED (erectile dysfunction)   . GAD (generalized anxiety disorder)   . Gynecomastia, male    followed by dr Shellia Cleverly  . High serum estradiol   . History of kidney stones   . History of small bowel obstruction   . Hypertension   . IBS (irritable bowel syndrome)   . Mixed hyperlipidemia   . OSA on CPAP    per last study 07-31-2013  severe osa  . Pre-diabetes   . Primary hypogonadism in male    endocrinologist-  dr Shellia Cleverly  . Wears glasses      Past Surgical History:  Procedure Laterality Date  . CARDIOVASCULAR STRESS TEST  10/05/2009   normal nuclear study w/ no ischemia/  normal LV function and wall motion , ef 71%  . COLONOSCOPY  last one 2004  . EVALUATION UNDER ANESTHESIA WITH ANAL FISTULECTOMY N/A 02/13/2018   Procedure: ANAL EXAM UNDER ANESTHESIA WITH BIOPSY;  Surgeon: [name], MD;  Location: [facility];  Service: General;  Laterality: N/A;  . ORCHIECTOMY Left 1990   w/ placement prosthesis (for torsion)  . URETEROLITHOTOMY  1999   Family History  Problem Relation Age of Onset  . Cancer Mother        breast cancer: stage I  . Hypertension Mother   . Irritable bowel syndrome Mother   . Heart disease Maternal Grandfather   . Diabetes Paternal Grandmother   . Colon cancer Maternal Aunt  great aunt  . Esophageal cancer Neg Hx    Social History   Tobacco Use  . Smoking status: Former Smoker    Years: 10.00    Types: Cigarettes    Quit date: 02/07/2009    Years since quitting: 10.6  . Smokeless tobacco: Never Used  Substance Use Topics  . Alcohol use: Not Currently    Comment: sober 04/2018  . Drug use: No   Current Outpatient Medications  Medication Sig Dispense Refill  . amLODipine (NORVASC) 5 MG tablet TAKE ONE TABLET BY MOUTH EVERY MORNING 90 tablet 0  . anastrozole (ARIMIDEX) 1 MG tablet Take 1 mg by mouth every morning.     Marland Kitchen atenolol (TENORMIN) 100 MG tablet TAKE ONE TABLET BY MOUTH EVERY MORNING 90 tablet 2  .  Calcium Carbonate-Vitamin D (CALCIUM-VITAMIN D) 600-125 MG-UNIT TABS 1 tablet daily.    . clomiPHENE (CLOMID) 50 MG tablet Take 0.5 tablets (25 mg total) by mouth daily. (Patient taking differently: Take 25 mg by mouth every Monday, Wednesday, and Friday. ) 15 tablet 11  . docusate sodium (COLACE) 100 MG capsule Take 200 mg by mouth 2 (two) times daily.    Marland Kitchen doxycycline (VIBRA-TABS) 100 MG tablet Take 1 tablet (100 mg total) by mouth daily. 30 tablet 0  . DULoxetine (CYMBALTA) 60 MG capsule Take 1 capsule (60 mg total) by mouth daily. For mood control 30 capsule 0  . gabapentin (NEURONTIN) 600 MG tablet Take 600 mg by mouth 2 (two) times daily as needed.    Marland Kitchen gemfibrozil (LOPID) 600 MG tablet TAKE ONE TABLET BY MOUTH TWICE A DAY BEFORE A MEAL 180 tablet 2  . lisinopril-hydrochlorothiazide (ZESTORETIC) 20-12.5 MG tablet TAKE 1 TABLET BY MOUTH DAILY 90 tablet 1  . metFORMIN (GLUCOPHAGE) 500 MG tablet Take 500 mg by mouth 2 (two) times daily with a meal.    . Multiple Vitamin (MULTIVITAMIN WITH MINERALS) TABS tablet Take 1 tablet by mouth daily.    Marland Kitchen NIFEdipine POWD Please compound into a 2% ointment with 2% lidocaine.  Apply rectally qid until resolution of symptoms. 100 g 1  . Omega-3 Fatty Acids (FISH OIL) 1000 MG CPDR Take by mouth.    . potassium chloride (KLOR-CON) 10 MEQ tablet TAKE ONE TABLET BY MOUTH DAILY 60 tablet 0  . Probiotic Product (PROBIOTIC PO) Take 1 tablet by mouth daily.    . rosuvastatin (CRESTOR) 10 MG tablet TAKE ONE TABLET BY MOUTH EVERY NIGHT AT BEDTIME 60 tablet 2   No current facility-administered medications for this visit.   Allergies  Allergen Reactions  . Atorvastatin Other (See Comments)    Muscle fatigue  . Bee Venom Swelling  . Sulfamethoxazole-Trimethoprim Hives    Bactrim      Review of Systems: All systems reviewed and negative except where noted in HPI.     Physical Exam:    Wt Readings from Last 3 Encounters:  09/14/19 256 lb 4 oz (116.2 kg)    08/19/19 255 lb 6.4 oz (115.8 kg)  05/12/19 229 lb (103.9 kg)    BP 124/78   Pulse 75   Temp (!) 97.1 F (36.2 C)   Ht 6' (1.829 m)   Wt 256 lb 4 oz (116.2 kg)   BMI 34.75 kg/m  Constitutional:  Pleasant, in no acute distress. Psychiatric: Normal mood and affect. Behavior is normal. EENT: Pupils normal.  Conjunctivae are normal. No scleral icterus. Neck supple. No cervical LAD. Cardiovascular: Normal rate, regular rhythm. No edema Pulmonary/chest: Effort  normal and breath sounds normal. No wheezing, rales or rhonchi. Abdominal: Soft, nondistended, nontender. Bowel sounds active throughout. There are no masses palpable. No hepatomegaly. Neurological: Alert and oriented to person place and time. Skin: Skin is warm and dry. No rashes noted. Rectal: Exam deferred by patient to time of colonoscopy.    ASSESSMENT AND PLAN;   1) Anal fissure 2) Constipation 3) Hematochezia -Colonoscopy to evaluate for more proximal luminal/mucosal pathology -Previous concern for Crohn's Disease in 2000 10/2002, treated with Remicade and steroids.  Subsequent colonoscopy with Microscopic Colitis.  More recently with constipation.  Low suspicion for underlying IBD, but certainly evaluate at time of colonoscopy.  Additionally, evaluate for rectal spasm, rectal stenosis -Perianal/rectal exam under anesthesia time of colonoscopy -Resume topical therapy for now -Resume stool softener  4) Heartburn 5) Dysphagia -EGD with esophageal dilation as appropriate -Evaluate for erosive esophagitis, LES laxity, hiatal hernia -Can plan for duodenal biopsies at the time of EGD given prior history of diarrhea to rule out additional small bowel pathology  6) History of AIN 1 -Perianal exam at time of colonoscopy as above.  If ongoing concern for AIN 1, plan to send to Dr. Dema Severin at [facility]  The indications, risks, and benefits of EGD and colonoscopy were explained to the patient in detail. Risks  include but are not limited to bleeding, perforation, adverse reaction to medications, and cardiopulmonary compromise. Sequelae include but are not limited to the possibility of surgery, hositalization, and mortality. The patient verbalized understanding and wished to proceed. All questions answered, referred to scheduler and bowel prep ordered. Further recommendations pending results of the exam.    Lavena Bullion, DO, FACG  09/14/2019, 9:42 AM   Luetta Nutting, DO

## 2019-09-14 NOTE — Patient Instructions (Signed)
If you are age 45 or older, your body mass index should be between 23-30. Your Body mass index is 34.75 kg/m. If this is out of the aforementioned range listed, please consider follow up with your Primary Care Provider.  If you are age 24 or younger, your body mass index should be between 19-25. Your Body mass index is 34.75 kg/m. If this is out of the aformentioned range listed, please consider follow up with your Primary Care Provider.    You have been scheduled for a colonoscopy. Please follow written instructions given to you at your visit today.  Please pick up your prep supplies at the pharmacy within the next 1-3 days. If you use inhalers (even only as needed), please bring them with you on the day of your procedure. Your physician has requested that you go to www.startemmi.com and enter the access code given to you at your visit today. This web site gives a general overview about your procedure. However, you should still follow specific instructions given to you by our office regarding your preparation for the procedure.  It was a pleasure to see you today!  Vito Cirigliano, D.O.

## 2019-10-05 ENCOUNTER — Other Ambulatory Visit: Payer: Self-pay | Admitting: Gastroenterology

## 2019-10-05 ENCOUNTER — Ambulatory Visit (INDEPENDENT_AMBULATORY_CARE_PROVIDER_SITE_OTHER): Payer: 59

## 2019-10-05 DIAGNOSIS — Z1159 Encounter for screening for other viral diseases: Secondary | ICD-10-CM | POA: Diagnosis not present

## 2019-10-06 LAB — SARS CORONAVIRUS 2 (TAT 6-24 HRS): SARS Coronavirus 2: NEGATIVE

## 2019-10-07 ENCOUNTER — Other Ambulatory Visit: Payer: Self-pay | Admitting: Family Medicine

## 2019-10-08 ENCOUNTER — Other Ambulatory Visit: Payer: Self-pay

## 2019-10-08 ENCOUNTER — Encounter: Payer: Self-pay | Admitting: Gastroenterology

## 2019-10-08 ENCOUNTER — Ambulatory Visit (AMBULATORY_SURGERY_CENTER): Payer: 59 | Admitting: Gastroenterology

## 2019-10-08 VITALS — BP 132/72 | HR 66 | Temp 97.3°F | Resp 16 | Ht 72.0 in | Wt 256.0 lb

## 2019-10-08 DIAGNOSIS — K921 Melena: Secondary | ICD-10-CM | POA: Diagnosis not present

## 2019-10-08 DIAGNOSIS — K2970 Gastritis, unspecified, without bleeding: Secondary | ICD-10-CM

## 2019-10-08 DIAGNOSIS — K5730 Diverticulosis of large intestine without perforation or abscess without bleeding: Secondary | ICD-10-CM

## 2019-10-08 DIAGNOSIS — K641 Second degree hemorrhoids: Secondary | ICD-10-CM

## 2019-10-08 DIAGNOSIS — R1310 Dysphagia, unspecified: Secondary | ICD-10-CM | POA: Diagnosis not present

## 2019-10-08 DIAGNOSIS — K5900 Constipation, unspecified: Secondary | ICD-10-CM

## 2019-10-08 DIAGNOSIS — K2100 Gastro-esophageal reflux disease with esophagitis, without bleeding: Secondary | ICD-10-CM | POA: Diagnosis not present

## 2019-10-08 DIAGNOSIS — K209 Esophagitis, unspecified without bleeding: Secondary | ICD-10-CM | POA: Diagnosis not present

## 2019-10-08 DIAGNOSIS — Z1211 Encounter for screening for malignant neoplasm of colon: Secondary | ICD-10-CM | POA: Diagnosis not present

## 2019-10-08 DIAGNOSIS — K3189 Other diseases of stomach and duodenum: Secondary | ICD-10-CM

## 2019-10-08 DIAGNOSIS — K2950 Unspecified chronic gastritis without bleeding: Secondary | ICD-10-CM | POA: Diagnosis not present

## 2019-10-08 DIAGNOSIS — K200 Eosinophilic esophagitis: Secondary | ICD-10-CM

## 2019-10-08 DIAGNOSIS — K219 Gastro-esophageal reflux disease without esophagitis: Secondary | ICD-10-CM | POA: Diagnosis not present

## 2019-10-08 MED ORDER — SODIUM CHLORIDE 0.9 % IV SOLN: 500.0000 mL | Freq: Once | INTRAVENOUS | Status: DC

## 2019-10-08 MED ORDER — PANTOPRAZOLE SODIUM 40 MG PO TBEC: 40.0000 mg | DELAYED_RELEASE_TABLET | Freq: Two times a day (BID) | ORAL | 3 refills | Status: DC

## 2019-10-08 NOTE — Progress Notes (Signed)
To PACU, VSS. Report to [email or web address] 

## 2019-10-08 NOTE — Op Note (Signed)
[facility] Patient Name: Tyler Gross Procedure Date: 10/08/2019 8:04 AM MRN: [id number] Endoscopist: Gerrit Heck , MD Age: 44 Referring MD:  Date of Birth: 26-Feb-1976 Gender: Male Account #: [id number] Procedure:                Upper GI endoscopy Indications:              Dysphagia, Heartburn, Suspected esophageal reflux Medicines:                Monitored Anesthesia Care Procedure:                Pre-Anesthesia Assessment:                           - Prior to the procedure, a History and Physical                            was performed, and patient medications and                            allergies were reviewed. The patient's tolerance of                            previous anesthesia was also reviewed. The risks                            and benefits of the procedure and the sedation                            options and risks were discussed with the patient.                            All questions were answered, and informed consent                            was obtained. Prior Anticoagulants: The patient has                            taken no previous anticoagulant or antiplatelet                            agents. ASA Grade Assessment: II - A patient with                            mild systemic disease. After reviewing the risks                            and benefits, the patient was deemed in                            satisfactory condition to undergo the procedure.                           After obtaining informed consent, the endoscope was  passed under direct vision. Throughout the                            procedure, the patient's blood pressure, pulse, and                            oxygen saturations were monitored continuously. The                            Endoscope was introduced through the mouth, and                            advanced to the second part of duodenum. The upper                            GI  endoscopy was accomplished without difficulty.                            The patient tolerated the procedure well. Scope In: Scope Out: Findings:                 Mucosal changes including feline appearance and                            longitudinal furrows were found in the middle third                            of the esophagus and in the lower third of the                            esophagus. The scope was withdrawn. Dilation was                            performed with a Maloney dilator with mild                            resistance at 57 Fr. The dilation site was examined                            following endoscope reinsertion and showed no                            bleeding, mucosal tear or perforation. Biopsies                            were then obtained from the proximal and distal                            esophagus with cold forceps for histology of                            suspected eosinophilic esophagitis. Estimated blood  loss was minimal.                           LA Grade B (one or more mucosal breaks greater than                            5 mm, not extending between the tops of two mucosal                            folds) esophagitis with no bleeding was found in                            the lower third of the esophagus.                           Localized moderate inflammation characterized by                            congestion (edema), erosions and erythema was found                            in the gastric antrum and in the prepyloric region                            of the stomach. Biopsies were taken with a cold                            forceps for Helicobacter pylori testing. Estimated                            blood loss was minimal.                           The gastric fundus and gastric body were normal.                            Biopsies were taken with a cold forceps for                             Helicobacter pylori testing. Estimated blood loss                            was minimal.                           The duodenal bulb, first portion of the duodenum                            and second portion of the duodenum were normal. Complications:            No immediate complications. Estimated Blood Loss:     Estimated blood loss was minimal. Impression:               - Esophageal mucosal changes suspicious for  eosinophilic esophagitis. Biopsied. Dilated.                           - LA Grade B reflux esophagitis with no bleeding.                           - Gastritis. Biopsied.                           - Normal gastric fundus and gastric body. Biopsied.                           - Normal duodenal bulb, first portion of the                            duodenum and second portion of the duodenum. Recommendation:           - Patient has a contact number available for                            emergencies. The signs and symptoms of potential                            delayed complications were discussed with the                            patient. Return to normal activities tomorrow.                            Written discharge instructions were provided to the                            patient.                           - Resume previous diet.                           - Continue present medications.                           - Await pathology results.                           - Increase Protonix (pantoprazole) to 40 mg PO BID                            for 8 weeks to promote mucosal healing.                           - Return to GI clinic at appointment to be                            scheduled.                           -  Will discuss the role and timing of repeat EGD                            based on biopsy results and response to therapy.                           - Colonoscopy today. Gerrit Heck, MD 10/08/2019 8:48:22 AM

## 2019-10-08 NOTE — Progress Notes (Signed)
Temp by JB  Vitals by CW 

## 2019-10-08 NOTE — Progress Notes (Signed)
Called to room to assist during endoscopic procedure.  Patient ID and intended procedure confirmed with present staff. Received instructions for my participation in the procedure from the performing physician.  

## 2019-10-08 NOTE — Patient Instructions (Signed)
Information on gastritis and diverticulosis given to you today.  Await pathology results.  Increase Protonix to 40 mg by mouth twice a day for 8 weeks to promote mucosal healing.  Return to GI clinic at appointment to be scheduled.  Repeat colonoscopy in 10 years.  Use fiber, for example Fibercon, Citrucel, Konsyl or Metamucil 1-2 times daily to try and bulk stools.  YOU HAD AN ENDOSCOPIC PROCEDURE TODAY AT [facility]:   Refer to the procedure report that was given to you for any specific questions about what was found during the examination.  If the procedure report does not answer your questions, please call your gastroenterologist to clarify.  If you requested that your care partner not be given the details of your procedure findings, then the procedure report has been included in a sealed envelope for you to review at your convenience later.  YOU SHOULD EXPECT: Some feelings of bloating in the abdomen. Passage of more gas than usual.  Walking can help get rid of the air that was put into your GI tract during the procedure and reduce the bloating. If you had a lower endoscopy (such as a colonoscopy or flexible sigmoidoscopy) you may notice spotting of blood in your stool or on the toilet paper. If you underwent a bowel prep for your procedure, you may not have a normal bowel movement for a few days.  Please Note:  You might notice some irritation and congestion in your nose or some drainage.  This is from the oxygen used during your procedure.  There is no need for concern and it should clear up in a day or so.  SYMPTOMS TO REPORT IMMEDIATELY:   Following lower endoscopy (colonoscopy or flexible sigmoidoscopy):  Excessive amounts of blood in the stool  Significant tenderness or worsening of abdominal pains  Swelling of the abdomen that is new, acute  Fever of 100F or higher   Following upper endoscopy (EGD)  Vomiting of blood or coffee ground material  New chest  pain or pain under the shoulder blades  Painful or persistently difficult swallowing  New shortness of breath  Fever of 100F or higher  Black, tarry-looking stools  For urgent or emergent issues, a gastroenterologist can be reached at any hour by calling [phone]. Do not use MyChart messaging for urgent concerns.    DIET:  We do recommend a small meal at first, but then you may proceed to your regular diet.  Drink plenty of fluids but you should avoid alcoholic beverages for 24 hours.  ACTIVITY:  You should plan to take it easy for the rest of today and you should NOT DRIVE or use heavy machinery until tomorrow (because of the sedation medicines used during the test).    FOLLOW UP: Our staff will call the number listed on your records 48-72 hours following your procedure to check on you and address any questions or concerns that you may have regarding the information given to you following your procedure. If we do not reach you, we will leave a message.  We will attempt to reach you two times.  During this call, we will ask if you have developed any symptoms of COVID 19. If you develop any symptoms (ie: fever, flu-like symptoms, shortness of breath, cough etc.) before then, please call [phone].  If you test positive for Covid 19 in the 2 weeks post procedure, please call and report this information to [place].    If any biopsies were taken  you will be contacted by phone or by letter within the next 1-3 weeks.  Please call us at [phone] if you have not heard about the biopsies in 3 weeks.    SIGNATURES/CONFIDENTIALITY: You and/or your care partner have signed paperwork which will be entered into your electronic medical record.  These signatures attest to the fact that that the information above on your After Visit Summary has been reviewed and is understood.  Full responsibility of the confidentiality of this discharge information lies with you and/or your care-partner.YOU HAD AN  ENDOSCOPIC PROCEDURE TODAY AT [facility]:   Refer to the procedure report that was given to you for any specific questions about what was found during the examination.  If the procedure report does not answer your questions, please call your gastroenterologist to clarify.  If you requested that your care partner not be given the details of your procedure findings, then the procedure report has been included in a sealed envelope for you to review at your convenience later.  YOU SHOULD EXPECT: Some feelings of bloating in the abdomen. Passage of more gas than usual.  Walking can help get rid of the air that was put into your GI tract during the procedure and reduce the bloating. If you had a lower endoscopy (such as a colonoscopy or flexible sigmoidoscopy) you may notice spotting of blood in your stool or on the toilet paper. If you underwent a bowel prep for your procedure, you may not have a normal bowel movement for a few days.  Please Note:  You might notice some irritation and congestion in your nose or some drainage.  This is from the oxygen used during your procedure.  There is no need for concern and it should clear up in a day or so.  SYMPTOMS TO REPORT IMMEDIATELY:   Following lower endoscopy (colonoscopy or flexible sigmoidoscopy):  Excessive amounts of blood in the stool  Significant tenderness or worsening of abdominal pains  Swelling of the abdomen that is new, acute  Fever of 100F or higher   Following upper endoscopy (EGD)  Vomiting of blood or coffee ground material  New chest pain or pain under the shoulder blades  Painful or persistently difficult swallowing  New shortness of breath  Fever of 100F or higher  Black, tarry-looking stools  For urgent or emergent issues, a gastroenterologist can be reached at any hour by calling [phone]. Do not use MyChart messaging for urgent concerns.    DIET:  We do recommend a small meal at first, but then you  may proceed to your regular diet.  Drink plenty of fluids but you should avoid alcoholic beverages for 24 hours.  ACTIVITY:  You should plan to take it easy for the rest of today and you should NOT DRIVE or use heavy machinery until tomorrow (because of the sedation medicines used during the test).    FOLLOW UP: Our staff will call the number listed on your records 48-72 hours following your procedure to check on you and address any questions or concerns that you may have regarding the information given to you following your procedure. If we do not reach you, we will leave a message.  We will attempt to reach you two times.  During this call, we will ask if you have developed any symptoms of COVID 19. If you develop any symptoms (ie: fever, flu-like symptoms, shortness of breath, cough etc.) before then, please call [phone].  If you test  positive for Covid 19 in the 2 weeks post procedure, please call and report this information to [place].    If any biopsies were taken you will be contacted by phone or by letter within the next 1-3 weeks.  Please call us at [phone] if you have not heard about the biopsies in 3 weeks.    SIGNATURES/CONFIDENTIALITY: You and/or your care partner have signed paperwork which will be entered into your electronic medical record.  These signatures attest to the fact that that the information above on your After Visit Summary has been reviewed and is understood.  Full responsibility of the confidentiality of this discharge information lies with you and/or your care-partner.

## 2019-10-08 NOTE — Op Note (Signed)
[place] [facility] Patient Name: Tyler Gross Procedure Date: 10/08/2019 8:03 AM MRN: [id number] Endoscopist: Gerrit Heck , MD Age: 44 Referring MD:  Date of Birth: 09-17-1975 Gender: Male Account #: [id number] Procedure:                Colonoscopy Indications:              Hematochezia, Constipation Medicines:                Monitored Anesthesia Care Procedure:                Pre-Anesthesia Assessment:                           - Prior to the procedure, a History and Physical                            was performed, and patient medications and                            allergies were reviewed. The patient's tolerance of                            previous anesthesia was also reviewed. The risks                            and benefits of the procedure and the sedation                            options and risks were discussed with the patient.                            All questions were answered, and informed consent                            was obtained. Prior Anticoagulants: The patient has                            taken no previous anticoagulant or antiplatelet                            agents. ASA Grade Assessment: II - A patient with                            mild systemic disease. After reviewing the risks                            and benefits, the patient was deemed in                            satisfactory condition to undergo the procedure.                           After obtaining informed consent, the colonoscope  was passed under direct vision. Throughout the                            procedure, the patient's blood pressure, pulse, and                            oxygen saturations were monitored continuously. The                            Colonoscope was introduced through the anus and                            advanced to the the terminal ileum. The colonoscopy                            was performed without difficulty.  The patient                            tolerated the procedure well. The quality of the                            bowel preparation was excellent. The terminal                            ileum, ileocecal valve, appendiceal orifice, and                            rectum were photographed. Scope In: 8:22:59 AM Scope Out: 8:36:05 AM Scope Withdrawal Time: 0 hours 10 minutes 43 seconds  Total Procedure Duration: 0 hours 13 minutes 6 seconds  Findings:                 Hemorrhoids were found on perianal exam.                           A few small-mouthed diverticula were found in the                            sigmoid colon.                           Non-bleeding internal hemorrhoids were found during                            retroflexion. The hemorrhoids were medium-sized and                            Grade II (internal hemorrhoids that prolapse but                            reduce spontaneously).                           The exam was otherwise normal throughout the  remainder of the colon.                           The terminal ileum appeared normal. Complications:            No immediate complications. Estimated Blood Loss:     Estimated blood loss: none. Impression:               - Hemorrhoids found on perianal exam.                           - Diverticulosis in the sigmoid colon.                           - Non-bleeding internal hemorrhoids.                           - The examined portion of the ileum was normal.                           - No specimens collected. Recommendation:           - Patient has a contact number available for                            emergencies. The signs and symptoms of potential                            delayed complications were discussed with the                            patient. Return to normal activities tomorrow.                            Written discharge instructions were provided to the                             patient.                           - Resume previous diet.                           - Continue present medications.                           - Repeat colonoscopy in 10 years for screening                            purposes.                           - Return to GI office at appointment to be                            scheduled.                           -  Use fiber, for example Citrucel, Fibercon, Konsyl                            or Metamucil.                           - Internal hemorrhoids were noted on this study and                            may be amenable to hemorrhoid band ligation. If you                            are interested in further treatment of these                            hemorrhoids with band ligation, please contact my                            clinic to set up an appointment for evaluation and                            treatment. Gerrit Heck, MD 10/08/2019 8:51:22 AM

## 2019-10-09 ENCOUNTER — Telehealth: Payer: Self-pay

## 2019-10-09 NOTE — Telephone Encounter (Signed)
Left message for patient to call back to the office;  4 wk f/u post EGD/colon-dysphagia/heartburn/suspected esophageal reflux/hematochezia/constipation/[place]

## 2019-10-12 ENCOUNTER — Telehealth: Payer: Self-pay | Admitting: *Deleted

## 2019-10-12 NOTE — Telephone Encounter (Signed)
Called and spoke with patient-patient has been scheduled for a f/u OV on 04/09/2021at 8:40 am with Dr. Bryan Lemma at [facility];  Patient advised to call back to the office at [phone] should questions/concerns arise;  Patient verbalized understanding of information/instructions;

## 2019-10-12 NOTE — Telephone Encounter (Signed)
  Follow up Call-  Call back number 10/08/2019  Post procedure Call Back phone  # [phone]  Permission to leave phone message Yes  Some recent data might be hidden     Patient questions:  Do you have a fever, pain , or abdominal swelling? No. Pain Score  0 *  Have you tolerated food without any problems? Yes.    Have you been able to return to your normal activities? Yes.    Do you have any questions about your discharge instructions: Diet   No. Medications  No. Follow up visit  No.  Do you have questions or concerns about your Care? No.  Actions: * If pain score is 4 or above: No action needed, pain <4  1. Have you developed a fever since your procedure? NO  2.   Have you had an respiratory symptoms (SOB or cough) since your procedure? NO  3.   Have you tested positive for COVID 19 since your procedure NO  4.   Have you had any family members/close contacts diagnosed with the COVID 19 since your procedure?  NO   If yes to any of these questions please route to Joylene John, RN and Alphonsa Gin, RN.

## 2019-10-12 NOTE — Telephone Encounter (Signed)
Pt returning your call

## 2019-10-15 ENCOUNTER — Ambulatory Visit: Payer: 59 | Admitting: Gastroenterology

## 2019-10-19 ENCOUNTER — Telehealth: Payer: Self-pay | Admitting: Gastroenterology

## 2019-10-19 NOTE — Telephone Encounter (Signed)
Patient returned your call about results, please call patient one more time.   

## 2019-10-20 NOTE — Telephone Encounter (Signed)
LMOM for patient to call back.

## 2019-10-21 ENCOUNTER — Other Ambulatory Visit: Payer: Self-pay | Admitting: Gastroenterology

## 2019-10-21 DIAGNOSIS — L738 Other specified follicular disorders: Secondary | ICD-10-CM | POA: Diagnosis not present

## 2019-10-21 DIAGNOSIS — D2262 Melanocytic nevi of left upper limb, including shoulder: Secondary | ICD-10-CM | POA: Diagnosis not present

## 2019-10-21 DIAGNOSIS — K2970 Gastritis, unspecified, without bleeding: Secondary | ICD-10-CM

## 2019-10-21 DIAGNOSIS — D2261 Melanocytic nevi of right upper limb, including shoulder: Secondary | ICD-10-CM | POA: Diagnosis not present

## 2019-10-21 DIAGNOSIS — D2372 Other benign neoplasm of skin of left lower limb, including hip: Secondary | ICD-10-CM | POA: Diagnosis not present

## 2019-10-21 DIAGNOSIS — K2100 Gastro-esophageal reflux disease with esophagitis, without bleeding: Secondary | ICD-10-CM

## 2019-10-21 DIAGNOSIS — D225 Melanocytic nevi of trunk: Secondary | ICD-10-CM | POA: Diagnosis not present

## 2019-10-21 DIAGNOSIS — D1723 Benign lipomatous neoplasm of skin and subcutaneous tissue of right leg: Secondary | ICD-10-CM | POA: Diagnosis not present

## 2019-10-21 DIAGNOSIS — L814 Other melanin hyperpigmentation: Secondary | ICD-10-CM | POA: Diagnosis not present

## 2019-10-21 DIAGNOSIS — L821 Other seborrheic keratosis: Secondary | ICD-10-CM | POA: Diagnosis not present

## 2019-10-21 DIAGNOSIS — D1801 Hemangioma of skin and subcutaneous tissue: Secondary | ICD-10-CM | POA: Diagnosis not present

## 2019-11-09 ENCOUNTER — Other Ambulatory Visit: Payer: Self-pay | Admitting: Family Medicine

## 2019-11-09 DIAGNOSIS — I10 Essential (primary) hypertension: Secondary | ICD-10-CM

## 2019-11-10 ENCOUNTER — Telehealth: Payer: Self-pay

## 2019-11-10 NOTE — Telephone Encounter (Signed)
Spoke with patient who is a previous patient of Dr. Zigmund Daniel who was calling for a refill on amlodipine 5 mg tabs. Per patient he would like to transfer with his primary doctor from [facility] to [facility] in [place]. Advised patient to call to and scheduled appointment with his primary and have him to refill the Rx since medication have been refilled by Dr. Zigmund Daniel in the past and he is no longer at our office. Please send to Dr. Zigmund Daniel for refill is possible.

## 2019-11-10 NOTE — Telephone Encounter (Signed)
Pt contacted the office requesting med refill for amlodipine. Unable to fill med refill. He has never been seen in our clinic. Pt has an New patient appt to est care with Dr. Zigmund Daniel on 11/20/19. Please authorize #[id number] rx to preferred pharmacy. Pt informed to contact your office for an update on rx med refill. Thanks.

## 2019-11-11 ENCOUNTER — Other Ambulatory Visit: Payer: Self-pay | Admitting: Behavioral Health

## 2019-11-11 DIAGNOSIS — I10 Essential (primary) hypertension: Secondary | ICD-10-CM

## 2019-11-11 MED ORDER — AMLODIPINE BESYLATE 5 MG PO TABS: 5.0000 mg | ORAL_TABLET | Freq: Every morning | ORAL | 0 refills | Status: DC

## 2019-11-11 NOTE — Telephone Encounter (Signed)
Called patient and informed him [facility] will call in refill for 30 days until he can establish care with Dr. Zigmund Daniel here at [facility].  Please verbalized understanding.

## 2019-11-11 NOTE — Telephone Encounter (Signed)
Unable to send rx for pt. Never seen in our practice. Pls advise, thanks.

## 2019-11-13 ENCOUNTER — Ambulatory Visit: Payer: 59 | Admitting: Gastroenterology

## 2019-11-19 ENCOUNTER — Encounter: Payer: Self-pay | Admitting: Gastroenterology

## 2019-11-19 ENCOUNTER — Ambulatory Visit: Payer: 59 | Admitting: Gastroenterology

## 2019-11-19 ENCOUNTER — Other Ambulatory Visit: Payer: Self-pay

## 2019-11-19 VITALS — BP 126/74 | HR 78 | Temp 96.8°F | Wt 261.5 lb

## 2019-11-19 DIAGNOSIS — K200 Eosinophilic esophagitis: Secondary | ICD-10-CM

## 2019-11-19 DIAGNOSIS — K219 Gastro-esophageal reflux disease without esophagitis: Secondary | ICD-10-CM

## 2019-11-19 DIAGNOSIS — K5900 Constipation, unspecified: Secondary | ICD-10-CM | POA: Diagnosis not present

## 2019-11-19 DIAGNOSIS — K641 Second degree hemorrhoids: Secondary | ICD-10-CM

## 2019-11-19 DIAGNOSIS — K2970 Gastritis, unspecified, without bleeding: Secondary | ICD-10-CM

## 2019-11-19 NOTE — Progress Notes (Signed)
P  Chief Complaint:    Procedure follow-up, symptomatic hemorrhoids  GI History: 44 y.o. male with a history of GAD, diabetes, IBS, OSA (on CPAP), hypertension, initially seen in the GI clinic on 09/14/2019 for evaluation of anal fissure, hematochezia, and change in bowel habits/constipation, along with chronic reflux and intermittent pill dysphagia.  Treated with Colace  bid, nifedipine w/ lidocaine at home with some improvement, but ongoing hematochezia.  -Colonoscopy (10/08/2019, Dr. Bryan Lemma): Sigmoid diverticulosis, internal hemorrhoids, otherwise normal with normal TI.  Repeat 10 years -EGD (10/08/2019, Dr. Bryan Lemma): Longitudinal furrows, felinization (biopsy with 20 eos lower/15 upper per hpf.  Favor  EoE based on endoscopic appearance), empiric 54 Fr Maloney dilation, LA Grade B esophagitis, non-H. pylori gastritis.  Repeat EGD in 8 weeks  Previous GI work-up as follows: In 2003/2004 developed abdominal pain and was admitted with SBO, treated with rest only (no NGT or surgery).  Colonoscopy at that time in [place], [place] with suspicion for Crohn's Disease.  Treated with Remicade (only 3 infusions) and steroids.  Symptoms improved, and followed up with a different GI, and was felt this was not Crohn's Disease and was diagnosed with Microscopic Colitis on a second colonoscopy.  Symptoms eventually resolved.    Diagnosed with anal fissure and 2019.  Referred to Dr. [name] at [facility], with EUA in 02/2018 n/f anal fissure.  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  Was treated with topical nifedipine with lidocaine with clinical improvement.  Separately, prior hx of HB- treated with pantoprazole with improvement and has since weaned off. No rare sxs.  Does have intermittent pill dysphagia.  No history of food impactions.  No prior EGD.  EGD in 10/2019 with longitudinal furrows of utilization with  elevated eosinophils (20 lower, 15 upper), biopsy, favoring EOE, or perhaps reflux overlap.  Treated with high-dose PPI with plan for repeat EGD in 8 weeks.  HPI:     Patient is a 44 y.o. male presenting to the Gastroenterology Clinic for follow-up.   Constipation much improved since increasing dietary fiber/fiber supplement along with Colace BID. Still scant BRB on tissue paper.   Reflux well controlled on pantoprazole BID. Actually reflux sxs stopped after quitting EtOH 04/2018. Pill dysphagia improved since EGD with dil and starting Rx. Planning repeat EGD to eval for histologic/endoscopic response. He may delay the timing due to upciming Urologic procedure.   Twin brother and mother with EoE.   Wants to schedule appt for hemorrhoid banding.     Review of systems:     No chest pain, no SOB, no fevers, no urinary sx   Past Medical History:  Diagnosis Date  . Alcoholism ([place])    sober since 04/2018  . Anal fissure   . Anal lesion    nonhealing  . Anxiety   . Depression   . Diabetes mellitus without complication ([place])   . ED (erectile dysfunction)   . GAD (generalized anxiety disorder)   . GERD (gastroesophageal reflux disease)   . Gynecomastia, male    followed by dr Shellia Cleverly  . High serum estradiol   . History of kidney stones   . History of small bowel obstruction   . Hypertension   . IBS (irritable bowel syndrome)   . Mixed hyperlipidemia   . OSA on CPAP    per last study 07-31-2013  severe osa  . Primary hypogonadism in male    endocrinologist-  dr Shellia Cleverly  .  Sleep apnea   . Wears glasses     Patient's surgical history, family medical history, social history, medications and allergies were all reviewed in Epic    Current Outpatient Medications  Medication Sig Dispense Refill  . amLODipine (NORVASC) 5 MG tablet Take 1 tablet (5 mg total) by mouth every morning. 30 tablet 0  . anastrozole (ARIMIDEX) 1 MG tablet Take 1 mg by mouth every morning.     Marland Kitchen atenolol  (TENORMIN) 100 MG tablet TAKE ONE TABLET BY MOUTH EVERY MORNING 90 tablet 2  . Calcium Carbonate-Vitamin D (CALCIUM-VITAMIN D) 600-125 MG-UNIT TABS 1 tablet daily.    . clomiPHENE (CLOMID) 50 MG tablet Take 0.5 tablets (25 mg total) by mouth daily. (Patient taking differently: Take 25 mg by mouth every Monday, Wednesday, and Friday. ) 15 tablet 11  . docusate sodium (COLACE) 100 MG capsule Take 200 mg by mouth 2 (two) times daily.    Marland Kitchen doxycycline (VIBRA-TABS) 100 MG tablet Take 100 mg by mouth 2 (two) times daily.    . DULoxetine (CYMBALTA) 60 MG capsule Take 1 capsule (60 mg total) by mouth daily. For mood control 30 capsule 0  . gabapentin (NEURONTIN) 600 MG tablet Take 600 mg by mouth 2 (two) times daily as needed.    Marland Kitchen gemfibrozil (LOPID) 600 MG tablet TAKE ONE TABLET BY MOUTH TWICE A DAY BEFORE A MEAL 180 tablet 2  . lisinopril-hydrochlorothiazide (ZESTORETIC) 20-12.5 MG tablet TAKE 1 TABLET BY MOUTH DAILY 90 tablet 1  . metFORMIN (GLUCOPHAGE) 500 MG tablet Take 500 mg by mouth 2 (two) times daily with a meal.    . Multiple Vitamin (MULTIVITAMIN WITH MINERALS) TABS tablet Take 1 tablet by mouth daily.    . Omega-3 Fatty Acids (FISH OIL) 1000 MG CPDR Take by mouth.    . pantoprazole (PROTONIX) 40 MG tablet Take 1 tablet (40 mg total) by mouth 2 (two) times daily. 90 tablet 3  . potassium chloride (KLOR-CON) 10 MEQ tablet TAKE ONE TABLET BY MOUTH DAILY 60 tablet 0  . Probiotic Product (PROBIOTIC PO) Take 1 tablet by mouth daily.    . rosuvastatin (CRESTOR) 10 MG tablet TAKE ONE TABLET BY MOUTH EVERY NIGHT AT BEDTIME 60 tablet 2   No current facility-administered medications for this visit.    Physical Exam:     BP 126/74   Pulse 78   Temp (!) 96.8 F (36 C)   Wt 261 lb 8 oz (118.6 kg)   BMI 35.47 kg/m   GENERAL:  Pleasant male in NAD PSYCH: : Cooperative, normal affect Musculoskeletal:  Normal muscle tone, normal strength NEURO: Alert and oriented x 3, no focal neurologic  deficits   IMPRESSION and PLAN:    1) Esophageal eosinophilia 2) GERD  - Repeat EGD with bxs (to schedule pending Urology eval with possoible upcomong Urology procedure) - Resume PPI  3) Internal hemorrhoids - Schedule hemorrhoid banidng  4) Constipation - Constipation improved -Continue current therapy along with high-fiber diet/fiber supplementation  The indications, risks, and benefits of EGD were explained to the patient in detail. Risks include but are not limited to bleeding, perforation, adverse reaction to medications, and cardiopulmonary compromise. Sequelae include but are not limited to the possibility of surgery, hositalization, and mortality. The patient verbalized understanding and wished to proceed. All questions answered, referred to scheduler. Further recommendations pending results of the exam.          Lavena Bullion ,DO, FACG 11/19/2019, 9:44 AM

## 2019-11-19 NOTE — Patient Instructions (Signed)
It has been recommended to you by your physician that you have a(n) EGD completed. Per your request, we did not schedule the procedure(s) today. Please contact our office at [phone] should you decide to have the procedure completed. You will be scheduled for a pre-visit and procedure at that time.  It was a pleasure to see you today!  Vito Cirigliano, D.O.

## 2019-11-20 ENCOUNTER — Encounter: Payer: Self-pay | Admitting: Family Medicine

## 2019-11-20 ENCOUNTER — Ambulatory Visit: Payer: 59 | Admitting: Family Medicine

## 2019-11-20 VITALS — BP 124/74 | HR 83 | Temp 98.3°F | Ht 73.0 in | Wt 263.0 lb

## 2019-11-20 DIAGNOSIS — F332 Major depressive disorder, recurrent severe without psychotic features: Secondary | ICD-10-CM

## 2019-11-20 DIAGNOSIS — I10 Essential (primary) hypertension: Secondary | ICD-10-CM

## 2019-11-20 DIAGNOSIS — F1024 Alcohol dependence with alcohol-induced mood disorder: Secondary | ICD-10-CM

## 2019-11-20 DIAGNOSIS — E119 Type 2 diabetes mellitus without complications: Secondary | ICD-10-CM | POA: Diagnosis not present

## 2019-11-20 MED ORDER — LISINOPRIL-HYDROCHLOROTHIAZIDE 20-12.5 MG PO TABS: 1.0000 | ORAL_TABLET | Freq: Every day | ORAL | 2 refills | Status: DC

## 2019-11-20 MED ORDER — POTASSIUM CHLORIDE CRYS ER 10 MEQ PO TBCR: 10.0000 meq | EXTENDED_RELEASE_TABLET | Freq: Every day | ORAL | 2 refills | Status: DC

## 2019-11-20 MED ORDER — ROSUVASTATIN CALCIUM 10 MG PO TABS: 10.0000 mg | ORAL_TABLET | Freq: Every day | ORAL | 2 refills | Status: DC

## 2019-11-20 MED ORDER — AMLODIPINE BESYLATE 5 MG PO TABS: 5.0000 mg | ORAL_TABLET | Freq: Every morning | ORAL | 2 refills | Status: DC

## 2019-11-20 NOTE — Patient Instructions (Signed)
Great to see you today! We'll be in touch with lab results and recommendations.  We'll plan to follow up in about 3 months.

## 2019-11-20 NOTE — Progress Notes (Deleted)
Tyler Gross - 44 y.o. male MRN [id number]  Date of birth: 30-Jun-1976  Subjective No chief complaint on file.   HPI  Allergies  Allergen Reactions  . Atorvastatin Other (See Comments)    Muscle fatigue  . Bee Venom Swelling  . Sulfamethoxazole-Trimethoprim Hives    Bactrim     Past Medical History:  Diagnosis Date  . Alcoholism ([place])    sober since 04/2018  . Anal fissure   . Anal lesion    nonhealing  . Anxiety   . Depression   . Diabetes mellitus without complication ([place])   . ED (erectile dysfunction)   . GAD (generalized anxiety disorder)   . GERD (gastroesophageal reflux disease)   . Gynecomastia, male    followed by dr Shellia Cleverly  . High serum estradiol   . History of kidney stones   . History of small bowel obstruction   . Hypertension   . IBS (irritable bowel syndrome)   . Mixed hyperlipidemia   . OSA on CPAP    per last study 07-31-2013  severe osa  . Primary hypogonadism in male    endocrinologist-  dr Shellia Cleverly  . Sleep apnea   . Wears glasses     Past Surgical History:  Procedure Laterality Date  . CARDIOVASCULAR STRESS TEST  10/05/2009   normal nuclear study w/ no ischemia/  normal LV function and wall motion , ef 71%  . COLONOSCOPY  last one 2004  . EVALUATION UNDER ANESTHESIA WITH ANAL FISTULECTOMY N/A 02/13/2018   Procedure: ANAL EXAM UNDER ANESTHESIA WITH BIOPSY;  Surgeon: [name], MD;  Location: [facility];  Service: General;  Laterality: N/A;  . ORCHIECTOMY Left 1990   w/ placement prosthesis (for torsion)  . URETEROLITHOTOMY  1999    Social History   Socioeconomic History  . Marital status: Single    Spouse name: Not on file  . Number of children: Not on file  . Years of education: Not on file  . Highest education level: Not on file  Occupational History  . Occupation: Therapist, sports   Tobacco Use  . Smoking status: Former Smoker    Years: 10.00    Types: Cigarettes    Quit date: 02/07/2009    Years since quitting:  10.7  . Smokeless tobacco: Never Used  Substance and Sexual Activity  . Alcohol use: Not Currently    Comment: sober 04/09/2018  . Drug use: No  . Sexual activity: Yes    Partners: Male  Other Topics Concern  . Not on file  Social History Narrative   Lives with partner   Regular exercise: no   Caffeine use: 1 large cup of coffee daily; 2 to 3 sodas in the evening   Social Determinants of Health   Financial Resource Strain:   . Difficulty of Paying Living Expenses:   Food Insecurity:   . Worried About Charity fundraiser in the Last Year:   . Arboriculturist in the Last Year:   Transportation Needs:   . Film/video editor (Medical):   Marland Kitchen Lack of Transportation (Non-Medical):   Physical Activity:   . Days of Exercise per Week:   . Minutes of Exercise per Session:   Stress:   . Feeling of Stress :   Social Connections:   . Frequency of Communication with Friends and Family:   . Frequency of Social Gatherings with Friends and Family:   . Attends Religious Services:   . Active Member of  Clubs or Organizations:   . Attends Archivist Meetings:   Marland Kitchen Marital Status:     Family History  Problem Relation Age of Onset  . Cancer Mother        breast cancer: stage I  . Hypertension Mother   . Irritable bowel syndrome Mother   . Heart disease Maternal Grandfather   . Diabetes Paternal Grandmother   . Colon cancer Maternal Aunt        great aunt  . Esophageal cancer Neg Hx   . Rectal cancer Neg Hx   . Stomach cancer Neg Hx     Health Maintenance  Topic Date Due  . FOOT EXAM  Never done  . OPHTHALMOLOGY EXAM  Never done  . HEMOGLOBIN A1C  02/16/2020  . INFLUENZA VACCINE  03/06/2020  . TETANUS/TDAP  01/27/2024  . PNEUMOCOCCAL POLYSACCHARIDE VACCINE AGE 41-64 HIGH RISK  Completed  . HIV Screening  Completed      ----------------------------------------------------------------------------------------------------------------------------------------------------------------------------------------------------------------- Physical Exam BP 124/74   Pulse 83   Temp 98.3 F (36.8 C) (Oral)   Ht 6\' 1"  (1.854 m)   Wt 263 lb (119.3 kg)   BMI 34.70 kg/m   Physical Exam  ------------------------------------------------------------------------------------------------------------------------------------------------------------------------------------------------------------------- Assessment and Plan  No problem-specific Assessment & Plan notes found for this encounter.   Meds ordered this encounter  Medications  . amLODipine (NORVASC) 5 MG tablet    Sig: Take 1 tablet (5 mg total) by mouth every morning.    Dispense:  90 tablet    Refill:  2  . lisinopril-hydrochlorothiazide (ZESTORETIC) 20-12.5 MG tablet    Sig: Take 1 tablet by mouth daily.    Dispense:  90 tablet    Refill:  2  . rosuvastatin (CRESTOR) 10 MG tablet    Sig: Take 1 tablet (10 mg total) by mouth at bedtime.    Dispense:  90 tablet    Refill:  2  . potassium chloride (KLOR-CON) 10 MEQ tablet    Sig: Take 1 tablet (10 mEq total) by mouth daily.    Dispense:  90 tablet    Refill:  2    Return in about 3 months (around 02/19/2020) for HTN/DM.    This visit occurred during the SARS-CoV-2 public health emergency.  Safety protocols were in place, including screening questions prior to the visit, additional usage of staff PPE, and extensive cleaning of exam room while observing appropriate contact time as indicated for disinfecting solutions.

## 2019-11-21 LAB — BASIC METABOLIC PANEL
BUN: 16 mg/dL (ref 7–25)
CO2: 27 mmol/L (ref 20–32)
Calcium: 9.3 mg/dL (ref 8.6–10.3)
Chloride: 106 mmol/L (ref 98–110)
Creat: 1.19 mg/dL (ref 0.60–1.35)
Glucose, Bld: 139 mg/dL (ref 65–139)
Potassium: 4.1 mmol/L (ref 3.5–5.3)
Sodium: 142 mmol/L (ref 135–146)

## 2019-11-21 LAB — HEMOGLOBIN A1C
Hgb A1c MFr Bld: 6.1 % of total Hgb — ABNORMAL HIGH (ref <5.7)
Mean Plasma Glucose: 128 (calc)
eAG (mmol/L): 7.1 (calc)

## 2019-11-23 ENCOUNTER — Encounter: Payer: Self-pay | Admitting: Family Medicine

## 2019-11-23 NOTE — Assessment & Plan Note (Signed)
Most recent A1c of  Lab Results  Component Value Date   HGBA1C 6.1 (H) 11/20/2019   indicates diabetes is well controlled.  he  will continue metformin.  Counseled on healthy, low carb diet and recommend frequent activity to help with maintaining good control of blood sugars.

## 2019-11-23 NOTE — Assessment & Plan Note (Signed)
Blood pressure is at goal at for age and co-morbidities.  I recommend he continue amlodipine and lisinopril/hctz.  In addition they were instructed to follow a low sodium diet with regular exercise to help to maintain adequate control of blood pressure.

## 2019-11-23 NOTE — Assessment & Plan Note (Signed)
Stable at this time. He will continue to follow with psychiatry.

## 2019-11-23 NOTE — Progress Notes (Signed)
Tyler Gross - 44 y.o. male MRN [id number]  Date of birth: 03/16/1976  Subjective Chief Complaint  Patient presents with  . Hypertension    HPI Tyler Gross is a 44 y.o. male with history of HTN, HLD, hypogonadism, T2DM, depression with anxiety here today for follow up.   -HTN: Current management with amlodipine and lisinopil/hctz.  Doing well with current medications.  Denies side effects.  He has not had any chest pain, shortness of breath, palpitations, headache or vision changes.   -HLD: Managed with crestor and lopid .  He is tolerating this well and denies side effects.    -T2DM:  Diabetes has been well controlled with dietary changes.  He is taking metformin as well. He is not monitoring blood sugars.  He has gained some weight back and is concerned that a1c may be higher.    -Depression and anxiety:  Seeing psychiatry for management of duloxetine and gabapentin.  Stable at this time with current medications.   . ROS:  A comprehensive ROS was completed and negative except as noted per HPI Allergies  Allergen Reactions  . Atorvastatin Other (See Comments)    Muscle fatigue  . Bee Venom Swelling  . Sulfamethoxazole-Trimethoprim Hives    Bactrim     Past Medical History:  Diagnosis Date  . Alcoholism ([place])    sober since 04/2018  . Anal fissure   . Anal lesion    nonhealing  . Anxiety   . Depression   . Diabetes mellitus without complication ([place])   . ED (erectile dysfunction)   . GAD (generalized anxiety disorder)   . GERD (gastroesophageal reflux disease)   . Gynecomastia, male    followed by dr Shellia Cleverly  . High serum estradiol   . History of kidney stones   . History of small bowel obstruction   . Hypertension   . IBS (irritable bowel syndrome)   . Mixed hyperlipidemia   . OSA on CPAP    per last study 07-31-2013  severe osa  . Primary hypogonadism in male    endocrinologist-  dr Shellia Cleverly  . Sleep apnea   . Wears glasses     Past Surgical  History:  Procedure Laterality Date  . CARDIOVASCULAR STRESS TEST  10/05/2009   normal nuclear study w/ no ischemia/  normal LV function and wall motion , ef 71%  . COLONOSCOPY  last one 2004  . EVALUATION UNDER ANESTHESIA WITH ANAL FISTULECTOMY N/A 02/13/2018   Procedure: ANAL EXAM UNDER ANESTHESIA WITH BIOPSY;  Surgeon: [name], MD;  Location: [facility];  Service: General;  Laterality: N/A;  . ORCHIECTOMY Left 1990   w/ placement prosthesis (for torsion)  . URETEROLITHOTOMY  1999    Social History   Socioeconomic History  . Marital status: Single    Spouse name: Not on file  . Number of children: Not on file  . Years of education: Not on file  . Highest education level: Not on file  Occupational History  . Occupation: Therapist, sports   Tobacco Use  . Smoking status: Former Smoker    Years: 10.00    Types: Cigarettes    Quit date: 02/07/2009    Years since quitting: 10.7  . Smokeless tobacco: Never Used  Substance and Sexual Activity  . Alcohol use: Not Currently    Comment: sober 04/09/2018  . Drug use: No  . Sexual activity: Yes    Partners: Male  Other Topics Concern  . Not on file  Social History Narrative   Lives with partner   Regular exercise: no   Caffeine use: 1 large cup of coffee daily; 2 to 3 sodas in the evening   Social Determinants of Health   Financial Resource Strain:   . Difficulty of Paying Living Expenses:   Food Insecurity:   . Worried About Charity fundraiser in the Last Year:   . Arboriculturist in the Last Year:   Transportation Needs:   . Film/video editor (Medical):   Marland Kitchen Lack of Transportation (Non-Medical):   Physical Activity:   . Days of Exercise per Week:   . Minutes of Exercise per Session:   Stress:   . Feeling of Stress :   Social Connections:   . Frequency of Communication with Friends and Family:   . Frequency of Social Gatherings with Friends and Family:   . Attends Religious Services:   . Active Member of  Clubs or Organizations:   . Attends Archivist Meetings:   Marland Kitchen Marital Status:     Family History  Problem Relation Age of Onset  . Cancer Mother        breast cancer: stage I  . Hypertension Mother   . Irritable bowel syndrome Mother   . Heart disease Maternal Grandfather   . Diabetes Paternal Grandmother   . Colon cancer Maternal Aunt        great aunt  . Esophageal cancer Neg Hx   . Rectal cancer Neg Hx   . Stomach cancer Neg Hx     Health Maintenance  Topic Date Due  . FOOT EXAM  Never done  . OPHTHALMOLOGY EXAM  Never done  . COVID-19 Vaccine (1) Never done  . INFLUENZA VACCINE  03/06/2020  . HEMOGLOBIN A1C  05/21/2020  . TETANUS/TDAP  01/27/2024  . PNEUMOCOCCAL POLYSACCHARIDE VACCINE AGE 76-64 HIGH RISK  Completed  . HIV Screening  Completed     ----------------------------------------------------------------------------------------------------------------------------------------------------------------------------------------------------------------- Physical Exam BP 124/74   Pulse 83   Temp 98.3 F (36.8 C) (Oral)   Ht 6\' 1"  (1.854 m)   Wt 263 lb (119.3 kg)   BMI 34.70 kg/m   Physical Exam Constitutional:      Appearance: Normal appearance.  HENT:     Head: Normocephalic and atraumatic.     Mouth/Throat:     Mouth: Mucous membranes are moist.  Eyes:     General: No scleral icterus. Cardiovascular:     Rate and Rhythm: Normal rate and regular rhythm.  Pulmonary:     Effort: Pulmonary effort is normal.     Breath sounds: Normal breath sounds.  Musculoskeletal:     Cervical back: Neck supple.  Skin:    General: Skin is warm and dry.  Neurological:     General: No focal deficit present.     Mental Status: He is alert.  Psychiatric:        Mood and Affect: Mood normal.        Behavior: Behavior normal.      ------------------------------------------------------------------------------------------------------------------------------------------------------------------------------------------------------------------- Assessment and Plan  Essential hypertension Blood pressure is at goal at for age and co-morbidities.  I recommend he continue amlodipine and lisinopril/hctz.  In addition they were instructed to follow a low sodium diet with regular exercise to help to maintain adequate control of blood pressure.    Type 2 diabetes mellitus without complication, without long-term current use of insulin (HCC) Most recent A1c of  Lab Results  Component Value Date   HGBA1C  6.1 (H) 11/20/2019   indicates diabetes is well controlled.  he  will continue metformin.  Counseled on healthy, low carb diet and recommend frequent activity to help with maintaining good control of blood sugars.    Severe recurrent major depression without psychotic features ([place]) Stable at this time. He will continue to follow with psychiatry.    Alcohol dependence with alcohol-induced mood disorder ([place]) He has done well in maintaining his sobriety.  No EtOH 05/2018   Meds ordered this encounter  Medications  . amLODipine (NORVASC) 5 MG tablet    Sig: Take 1 tablet (5 mg total) by mouth every morning.    Dispense:  90 tablet    Refill:  2  . lisinopril-hydrochlorothiazide (ZESTORETIC) 20-12.5 MG tablet    Sig: Take 1 tablet by mouth daily.    Dispense:  90 tablet    Refill:  2  . rosuvastatin (CRESTOR) 10 MG tablet    Sig: Take 1 tablet (10 mg total) by mouth at bedtime.    Dispense:  90 tablet    Refill:  2  . potassium chloride (KLOR-CON) 10 MEQ tablet    Sig: Take 1 tablet (10 mEq total) by mouth daily.    Dispense:  90 tablet    Refill:  2    Return in about 3 months (around 02/19/2020) for HTN/DM.    This visit occurred during the SARS-CoV-2 public health emergency.  Safety protocols were in place,  including screening questions prior to the visit, additional usage of staff PPE, and extensive cleaning of exam room while observing appropriate contact time as indicated for disinfecting solutions.

## 2019-11-23 NOTE — Assessment & Plan Note (Signed)
He has done well in maintaining his sobriety.  No EtOH 05/2018

## 2019-12-01 ENCOUNTER — Other Ambulatory Visit: Payer: Self-pay | Admitting: Family Medicine

## 2019-12-01 NOTE — Telephone Encounter (Signed)
Please help with refill request, Dr. Zigmund Daniel pt at new location.

## 2019-12-07 ENCOUNTER — Other Ambulatory Visit: Payer: Self-pay

## 2019-12-07 ENCOUNTER — Other Ambulatory Visit: Payer: Self-pay | Admitting: Family Medicine

## 2019-12-07 ENCOUNTER — Ambulatory Visit: Payer: 59 | Admitting: Gastroenterology

## 2019-12-07 ENCOUNTER — Encounter: Payer: Self-pay | Admitting: Gastroenterology

## 2019-12-07 VITALS — BP 110/76 | HR 74 | Temp 97.1°F | Ht 72.0 in | Wt 261.1 lb

## 2019-12-07 DIAGNOSIS — K921 Melena: Secondary | ICD-10-CM | POA: Diagnosis not present

## 2019-12-07 DIAGNOSIS — K641 Second degree hemorrhoids: Secondary | ICD-10-CM

## 2019-12-07 DIAGNOSIS — K649 Unspecified hemorrhoids: Secondary | ICD-10-CM

## 2019-12-07 NOTE — Progress Notes (Signed)
P  Chief Complaint:    Symptomatic Internal Hemorrhoids; Hemorrhoid Band Ligation  GI History: 44 y.o.malewith a history of GAD, diabetes, IBS, OSA (on CPAP), hypertension, initially seen in the GI clinic on 09/14/2019 for evaluation of anal fissure, hematochezia, and change in bowel habits/constipation, along with chronic reflux and intermittent pill dysphagia.  Anal fissure resolved with nifedipine with lidocaine, but due to ongoing hematochezia, evaluated colonoscopy, which was notable for internal hemorrhoids as below.  Reflux well controlled on pantoprazole BID. Actually reflux sxs stopped after quitting EtOH 04/2018. Pill dysphagia improved since EGD with dil and starting Rx.  EGD with esophageal eosinophilia as below, favoring EOE.  Planning repeat EGD to eval for histologic/endoscopic response.    Interestingly, twin brother and mother with EoE.  -Colonoscopy (10/08/2019, Dr. Bryan Lemma): Sigmoid diverticulosis, internal hemorrhoids, otherwise normal with normal TI.  Repeat 10 years -EGD (10/08/2019, Dr. Bryan Lemma): Longitudinal furrows, felinization (biopsy with 20 eos lower/15 upper per hpf.  Favor  EoE based on endoscopic appearance), empiric 54 Fr Maloney dilation, LA Grade B esophagitis, non-H. pylori gastritis.  Repeat EGD in 8 weeks  Previous GI work-up as follows: In 2003/2004 developed abdominal pain and was admitted with SBO, treated with rest only (no NGT or surgery). Colonoscopy at that time in [place], [place] with suspicion for Crohn's Disease. Treated with Remicade (only 3 infusions) and steroids. Symptoms improved, and followed up with a different GI, and was felt this was not Crohn's Disease and was diagnosed with Microscopic Colitis on a second colonoscopy. Symptoms eventually resolved.   Diagnosed with anal fissure and 2019. Referred to Dr. [name] at [facility], with EUAin 7/2019n/fanal fissure. 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 Was treated with topical nifedipine with lidocaine with clinical improvement.   HPI:    Patient is a 44 y.o. malewith a history of symptomatic internal hemorrhoids presenting to the Gastroenterology Clinic for follow-up and ongoing treatment. The patient presents with symptomatic grade 2  hemorrhoids, unresponsive to maximal medical therapy, requesting rubber band ligation of symptomatic hemorrhoidal disease.  Has chronic constipation which is new since last appointment.  Has since stopped Colace, and controlling symptoms with just dietary fiber/fiber supplement.  Still with scant BRBPR, presents today for hemorrhoid banding for persistently symptomatic hemorrhoids.  No change in medical or surgical history, medications, allergies, social history since last appointment with me.   Review of systems:     No chest pain, no SOB, no fevers, no urinary sx   Past Medical History:  Diagnosis Date  . Alcoholism ([place])    sober since 04/2018  . Anal fissure   . Anal lesion    nonhealing  . Anxiety   . Depression   . Diabetes mellitus without complication ([place])   . ED (erectile dysfunction)   . GAD (generalized anxiety disorder)   . GERD (gastroesophageal reflux disease)   . Gynecomastia, male    followed by dr Shellia Cleverly  . High serum estradiol   . History of kidney stones   . History of small bowel obstruction   . Hypertension   . IBS (irritable bowel syndrome)   . Mixed hyperlipidemia   . OSA on CPAP    per last study 07-31-2013  severe osa  . Primary hypogonadism in male    endocrinologist-  dr Shellia Cleverly  . Sleep apnea   . Wears glasses     Patient's surgical history, family medical history, social history,  medications and allergies were all reviewed in Epic    Current Outpatient Medications  Medication Sig Dispense Refill  . amLODipine (NORVASC) 5 MG tablet Take 1 tablet (5 mg total) by mouth every morning. 90 tablet  2  . anastrozole (ARIMIDEX) 1 MG tablet Take 1 mg by mouth every morning.     Marland Kitchen atenolol (TENORMIN) 100 MG tablet TAKE ONE TABLET BY MOUTH EVERY MORNING 90 tablet 2  . Calcium Carbonate-Vitamin D (CALCIUM-VITAMIN D) 600-125 MG-UNIT TABS 1 tablet daily.    . clomiPHENE (CLOMID) 50 MG tablet Take 0.5 tablets (25 mg total) by mouth daily. (Patient taking differently: Take 25 mg by mouth every Monday, Wednesday, and Friday. ) 15 tablet 11  . doxycycline (VIBRA-TABS) 100 MG tablet Take 100 mg by mouth 2 (two) times daily.    . DULoxetine (CYMBALTA) 60 MG capsule Take 1 capsule (60 mg total) by mouth daily. For mood control 30 capsule 0  . gabapentin (NEURONTIN) 600 MG tablet Take 600 mg by mouth 2 (two) times daily as needed.    Marland Kitchen gemfibrozil (LOPID) 600 MG tablet TAKE ONE TABLET BY MOUTH TWICE A DAY BEFORE A MEAL 180 tablet 2  . lisinopril-hydrochlorothiazide (ZESTORETIC) 20-25 MG tablet TAKE ONE TABLET BY MOUTH DAILY 90 tablet 2  . metFORMIN (GLUCOPHAGE) 500 MG tablet TAKE ONE TABLET BY MOUTH TWICE A DAY WITH A MEAL 180 tablet 2  . Multiple Vitamin (MULTIVITAMIN WITH MINERALS) TABS tablet Take 1 tablet by mouth daily.    . Omega-3 Fatty Acids (FISH OIL) 1000 MG CPDR Take by mouth.    . pantoprazole (PROTONIX) 40 MG tablet Take 1 tablet (40 mg total) by mouth 2 (two) times daily. 90 tablet 3  . potassium chloride (KLOR-CON) 10 MEQ tablet Take 1 tablet (10 mEq total) by mouth daily. 90 tablet 2  . Probiotic Product (PROBIOTIC PO) Take 1 tablet by mouth daily.    . rosuvastatin (CRESTOR) 10 MG tablet Take 1 tablet (10 mg total) by mouth at bedtime. 90 tablet 2   No current facility-administered medications for this visit.    Physical Exam:     BP 110/76   Pulse 74   Temp (!) 97.1 F (36.2 C)   Ht 6' (1.829 m)   Wt 261 lb 2 oz (118.4 kg)   BMI 35.41 kg/m   GENERAL:  Pleasant male in NAD PSYCH: : Cooperative, normal affect NEURO: Alert and oriented x 3, no focal neurologic deficits Rectal  exam: Sensation intact and preserved anal wink.  Grade 2 hemorrhoids noted in all positions on anoscopy.  No external anal fissures noted. Normal sphincter tone. No palpable mass. No blood on the exam glove. (Chaperone: Curlene Labrum, CMA).   IMPRESSION and PLAN:    1)  Symptomatic internal hemorrhoids: PROCEDURE NOTE: The patient presents with symptomatic grade 2 hemorrhoids, unresponsive to maximal medical therapy, requesting rubber band ligation of symptomatic hemorrhoidal disease.  All risks, benefits and alternative forms of therapy were described and informed consent was obtained.  In the Left Lateral Decubitus position, anoscopic examination revealed grade 2 hemorrhoids in the all position(s).  The anorectum was pre-medicated with RectiCare. The decision was made to band the LL internal hemorrhoid, and the Raymond was used to perform band ligation without complication.  Digital anorectal examination was then performed to assure proper positioning of the band, and to adjust the banded tissue as required.  The patient was discharged home without pain or other issues.  Dietary and behavioral  recommendations were given and along with follow-up instructions.     The following adjunctive treatments were recommended:  -Resume high-fiber diet with fiber supplement (i.e. Citrucel or Benefiber) with goal for soft stools without straining to have a BM. -Resume adequate fluid intake.  The patient will return in 2-4 for  follow-up and possible additional banding as required. No complications were encountered and the patient tolerated the procedure well.  2) Esophageal eosinophilia 3) GERD  - Repeat EGD with bxs (to schedule pending Urology eval with possoible upcomong Urology procedure) - Resume PPI  4) Constipation - Constipation improved -Continue current therapy along with high-fiber diet/fiber supplementation      Dominic Pea Ghassan Coggeshall ,DO, FACG 12/07/2019, 10:35 AM

## 2019-12-07 NOTE — Patient Instructions (Signed)
It was a pleasure to see you today!  Vito Cirigliano, D.O.  

## 2019-12-08 ENCOUNTER — Encounter: Payer: 59 | Admitting: Gastroenterology

## 2019-12-08 DIAGNOSIS — E291 Testicular hypofunction: Secondary | ICD-10-CM | POA: Diagnosis not present

## 2019-12-15 DIAGNOSIS — N522 Drug-induced erectile dysfunction: Secondary | ICD-10-CM | POA: Diagnosis not present

## 2019-12-15 DIAGNOSIS — E291 Testicular hypofunction: Secondary | ICD-10-CM | POA: Diagnosis not present

## 2019-12-21 ENCOUNTER — Other Ambulatory Visit: Payer: Self-pay | Admitting: Urology

## 2019-12-23 ENCOUNTER — Ambulatory Visit: Payer: 59 | Admitting: Gastroenterology

## 2020-01-14 ENCOUNTER — Encounter: Payer: Self-pay | Admitting: Family Medicine

## 2020-01-14 DIAGNOSIS — F4010 Social phobia, unspecified: Secondary | ICD-10-CM | POA: Diagnosis not present

## 2020-01-14 DIAGNOSIS — F339 Major depressive disorder, recurrent, unspecified: Secondary | ICD-10-CM | POA: Diagnosis not present

## 2020-01-14 DIAGNOSIS — F410 Panic disorder [episodic paroxysmal anxiety] without agoraphobia: Secondary | ICD-10-CM | POA: Diagnosis not present

## 2020-01-14 DIAGNOSIS — F411 Generalized anxiety disorder: Secondary | ICD-10-CM | POA: Diagnosis not present

## 2020-01-14 DIAGNOSIS — F4310 Post-traumatic stress disorder, unspecified: Secondary | ICD-10-CM | POA: Diagnosis not present

## 2020-01-15 ENCOUNTER — Other Ambulatory Visit: Payer: Self-pay | Admitting: Family Medicine

## 2020-01-15 DIAGNOSIS — Z111 Encounter for screening for respiratory tuberculosis: Secondary | ICD-10-CM

## 2020-01-16 ENCOUNTER — Encounter: Payer: Self-pay | Admitting: Family Medicine

## 2020-01-18 ENCOUNTER — Other Ambulatory Visit: Payer: Self-pay | Admitting: Family Medicine

## 2020-01-18 DIAGNOSIS — Z0184 Encounter for antibody response examination: Secondary | ICD-10-CM

## 2020-01-19 DIAGNOSIS — E291 Testicular hypofunction: Secondary | ICD-10-CM | POA: Diagnosis not present

## 2020-01-19 DIAGNOSIS — Z111 Encounter for screening for respiratory tuberculosis: Secondary | ICD-10-CM | POA: Diagnosis not present

## 2020-01-19 DIAGNOSIS — Z0184 Encounter for antibody response examination: Secondary | ICD-10-CM | POA: Diagnosis not present

## 2020-01-19 DIAGNOSIS — T83491D Other mechanical complication of implanted testicular prosthesis, subsequent encounter: Secondary | ICD-10-CM | POA: Diagnosis not present

## 2020-01-20 LAB — MEASLES/MUMPS/RUBELLA IMMUNITY
Mumps IgG: 24.4 AU/mL
Rubella: 2.8 Index
Rubeola IgG: 36 AU/mL

## 2020-01-21 LAB — QUANTIFERON-TB GOLD PLUS
Mitogen-NIL: >10 IU/mL
NIL: 0.03 IU/mL
QuantiFERON-TB Gold Plus: NEGATIVE
TB1-NIL: 0 IU/mL
TB2-NIL: 0.01 IU/mL

## 2020-01-22 ENCOUNTER — Other Ambulatory Visit: Payer: Self-pay

## 2020-01-22 ENCOUNTER — Encounter (HOSPITAL_BASED_OUTPATIENT_CLINIC_OR_DEPARTMENT_OTHER): Payer: Self-pay | Admitting: Urology

## 2020-01-22 NOTE — Progress Notes (Signed)
Spoke w/ via phone for pre-op interview---pt Lab needs dos----   I stat 8, ekg           COVID test ------01-26-2020 at 950 am Arrive at -------1130 am 01-29-2020 No food after midnight clear liquids no dairy products or milk from midnight until 730 am then npo Medications to take morning of surgery -----duloxetine, gemfibrozil, armidex, atenolol, clomid, pantaprazole, amlodipine, gabapentin Diabetic medication -----none day of surgery Patient Special Instructions -----bring cpcp mask tubing and machine and leave in car Pre-Op special Istructions -----shower am of surgery chin to toes with hibiclens  Patient verbalized understanding of instructions that were given at this phone interview. Patient denies shortness of breath, chest pain, fever, cough a this phone interview.

## 2020-01-26 ENCOUNTER — Other Ambulatory Visit (HOSPITAL_COMMUNITY)
Admission: RE | Admit: 2020-01-26 | Discharge: 2020-01-26 | Disposition: A | Discharge status: Home / Self Care | Payer: 59 | Source: Ambulatory Visit | Attending: Urology | Admitting: Urology

## 2020-01-26 DIAGNOSIS — Z20822 Contact with and (suspected) exposure to covid-19: Secondary | ICD-10-CM | POA: Insufficient documentation

## 2020-01-26 DIAGNOSIS — Z01812 Encounter for preprocedural laboratory examination: Secondary | ICD-10-CM | POA: Diagnosis not present

## 2020-01-26 LAB — SARS CORONAVIRUS 2 (TAT 6-24 HRS): SARS Coronavirus 2: NEGATIVE

## 2020-01-27 ENCOUNTER — Encounter: Payer: Self-pay | Admitting: Family Medicine

## 2020-01-27 ENCOUNTER — Other Ambulatory Visit: Payer: Self-pay | Admitting: Family Medicine

## 2020-01-28 MED ORDER — VANCOMYCIN HCL 1500 MG/300ML IV SOLN
1500.0000 mg | INTRAVENOUS | Status: AC
  Administered 2020-01-29 (×2): 1500 mg via INTRAVENOUS
  Filled 2020-01-28: qty 300

## 2020-01-29 ENCOUNTER — Encounter (HOSPITAL_BASED_OUTPATIENT_CLINIC_OR_DEPARTMENT_OTHER): Payer: Self-pay | Admitting: Urology

## 2020-01-29 ENCOUNTER — Encounter (HOSPITAL_BASED_OUTPATIENT_CLINIC_OR_DEPARTMENT_OTHER)
Admission: RE | Disposition: A | Discharge status: Home / Self Care | Payer: Self-pay | Source: Ambulatory Visit | Attending: Urology

## 2020-01-29 ENCOUNTER — Ambulatory Visit (HOSPITAL_BASED_OUTPATIENT_CLINIC_OR_DEPARTMENT_OTHER): Payer: 59 | Admitting: Anesthesiology

## 2020-01-29 ENCOUNTER — Ambulatory Visit (HOSPITAL_BASED_OUTPATIENT_CLINIC_OR_DEPARTMENT_OTHER)
Admission: RE | Admit: 2020-01-29 | Discharge: 2020-01-29 | Disposition: A | Discharge status: Home / Self Care | Payer: 59 | Source: Ambulatory Visit | Attending: Urology | Admitting: Urology

## 2020-01-29 DIAGNOSIS — Z833 Family history of diabetes mellitus: Secondary | ICD-10-CM | POA: Insufficient documentation

## 2020-01-29 DIAGNOSIS — F411 Generalized anxiety disorder: Secondary | ICD-10-CM | POA: Insufficient documentation

## 2020-01-29 DIAGNOSIS — Z803 Family history of malignant neoplasm of breast: Secondary | ICD-10-CM | POA: Insufficient documentation

## 2020-01-29 DIAGNOSIS — Z881 Allergy status to other antibiotic agents status: Secondary | ICD-10-CM | POA: Insufficient documentation

## 2020-01-29 DIAGNOSIS — Z882 Allergy status to sulfonamides status: Secondary | ICD-10-CM | POA: Insufficient documentation

## 2020-01-29 DIAGNOSIS — Z8379 Family history of other diseases of the digestive system: Secondary | ICD-10-CM | POA: Insufficient documentation

## 2020-01-29 DIAGNOSIS — Z87891 Personal history of nicotine dependence: Secondary | ICD-10-CM | POA: Diagnosis not present

## 2020-01-29 DIAGNOSIS — Z888 Allergy status to other drugs, medicaments and biological substances status: Secondary | ICD-10-CM | POA: Insufficient documentation

## 2020-01-29 DIAGNOSIS — K589 Irritable bowel syndrome without diarrhea: Secondary | ICD-10-CM | POA: Diagnosis not present

## 2020-01-29 DIAGNOSIS — I10 Essential (primary) hypertension: Secondary | ICD-10-CM | POA: Diagnosis not present

## 2020-01-29 DIAGNOSIS — Z87442 Personal history of urinary calculi: Secondary | ICD-10-CM | POA: Insufficient documentation

## 2020-01-29 DIAGNOSIS — Z9079 Acquired absence of other genital organ(s): Secondary | ICD-10-CM | POA: Insufficient documentation

## 2020-01-29 DIAGNOSIS — E119 Type 2 diabetes mellitus without complications: Secondary | ICD-10-CM | POA: Diagnosis not present

## 2020-01-29 DIAGNOSIS — Z800 Family history of malignant neoplasm of digestive organs: Secondary | ICD-10-CM | POA: Diagnosis not present

## 2020-01-29 DIAGNOSIS — T83411A Breakdown (mechanical) of implanted testicular prosthesis, initial encounter: Secondary | ICD-10-CM | POA: Diagnosis not present

## 2020-01-29 DIAGNOSIS — K219 Gastro-esophageal reflux disease without esophagitis: Secondary | ICD-10-CM | POA: Diagnosis not present

## 2020-01-29 DIAGNOSIS — Y939 Activity, unspecified: Secondary | ICD-10-CM | POA: Diagnosis not present

## 2020-01-29 DIAGNOSIS — E782 Mixed hyperlipidemia: Secondary | ICD-10-CM | POA: Insufficient documentation

## 2020-01-29 DIAGNOSIS — F329 Major depressive disorder, single episode, unspecified: Secondary | ICD-10-CM | POA: Diagnosis not present

## 2020-01-29 DIAGNOSIS — X58XXXA Exposure to other specified factors, initial encounter: Secondary | ICD-10-CM | POA: Diagnosis not present

## 2020-01-29 DIAGNOSIS — Z91030 Bee allergy status: Secondary | ICD-10-CM | POA: Insufficient documentation

## 2020-01-29 DIAGNOSIS — Z8249 Family history of ischemic heart disease and other diseases of the circulatory system: Secondary | ICD-10-CM | POA: Diagnosis not present

## 2020-01-29 DIAGNOSIS — G4733 Obstructive sleep apnea (adult) (pediatric): Secondary | ICD-10-CM | POA: Insufficient documentation

## 2020-01-29 DIAGNOSIS — Z4689 Encounter for fitting and adjustment of other specified devices: Secondary | ICD-10-CM | POA: Diagnosis not present

## 2020-01-29 DIAGNOSIS — T8389XA Other specified complication of genitourinary prosthetic devices, implants and grafts, initial encounter: Secondary | ICD-10-CM | POA: Insufficient documentation

## 2020-01-29 DIAGNOSIS — T83491A Other mechanical complication of implanted testicular prosthesis, initial encounter: Secondary | ICD-10-CM | POA: Diagnosis not present

## 2020-01-29 HISTORY — DX: Acne, unspecified: L70.9

## 2020-01-29 HISTORY — PX: TESTICULAR EXPLORATION: SHX5145

## 2020-01-29 HISTORY — DX: Personal history of other diseases of the digestive system: Z87.19

## 2020-01-29 HISTORY — DX: Type 2 diabetes mellitus without complications: E11.9

## 2020-01-29 LAB — POCT I-STAT, CHEM 8
BUN: 21 mg/dL — ABNORMAL HIGH (ref 6–20)
Calcium, Ion: 1.23 mmol/L (ref 1.15–1.40)
Chloride: 105 mmol/L (ref 98–111)
Creatinine, Ser: 1.2 mg/dL (ref 0.61–1.24)
Glucose, Bld: 102 mg/dL — ABNORMAL HIGH (ref 70–99)
HCT: 48 % (ref 39.0–52.0)
Hemoglobin: 16.3 g/dL (ref 13.0–17.0)
Potassium: 4 mmol/L (ref 3.5–5.1)
Sodium: 145 mmol/L (ref 135–145)
TCO2: 25 mmol/L (ref 22–32)

## 2020-01-29 LAB — GLUCOSE, CAPILLARY: Glucose-Capillary: 97 mg/dL (ref 70–99)

## 2020-01-29 SURGERY — EXPLORATION, TESTICLE
Anesthesia: General | Site: Scrotum | Laterality: Left

## 2020-01-29 MED ORDER — SODIUM CHLORIDE 0.9 % IV SOLN
Freq: Once | INTRAVENOUS | Status: AC
  Administered 2020-01-29: 500 mL
  Filled 2020-01-29: qty 500000

## 2020-01-29 MED ORDER — PROMETHAZINE HCL 25 MG/ML IJ SOLN: 6.2500 mg | INTRAMUSCULAR | Status: DC | PRN

## 2020-01-29 MED ORDER — PROPOFOL 10 MG/ML IV BOLUS
INTRAVENOUS | Status: DC | PRN
  Administered 2020-01-29: 200 mg via INTRAVENOUS

## 2020-01-29 MED ORDER — LIDOCAINE 2% (20 MG/ML) 5 ML SYRINGE
INTRAMUSCULAR | Status: DC | PRN
  Administered 2020-01-29: 100 mg via INTRAVENOUS

## 2020-01-29 MED ORDER — ONDANSETRON HCL 4 MG/2ML IJ SOLN
INTRAMUSCULAR | Status: AC
  Filled 2020-01-29: qty 2

## 2020-01-29 MED ORDER — DEXAMETHASONE SODIUM PHOSPHATE 10 MG/ML IJ SOLN
INTRAMUSCULAR | Status: AC
  Filled 2020-01-29: qty 1

## 2020-01-29 MED ORDER — MIDAZOLAM HCL 5 MG/5ML IJ SOLN
INTRAMUSCULAR | Status: DC | PRN
  Administered 2020-01-29: 2 mg via INTRAVENOUS

## 2020-01-29 MED ORDER — PHENYLEPHRINE 40 MCG/ML (10ML) SYRINGE FOR IV PUSH (FOR BLOOD PRESSURE SUPPORT)
PREFILLED_SYRINGE | INTRAVENOUS | Status: AC
  Filled 2020-01-29: qty 10

## 2020-01-29 MED ORDER — MIDAZOLAM HCL 2 MG/2ML IJ SOLN
INTRAMUSCULAR | Status: AC
  Filled 2020-01-29: qty 2

## 2020-01-29 MED ORDER — EPHEDRINE SULFATE-NACL 50-0.9 MG/10ML-% IV SOSY
PREFILLED_SYRINGE | INTRAVENOUS | Status: DC | PRN
  Administered 2020-01-29 (×4): 10 mg via INTRAVENOUS

## 2020-01-29 MED ORDER — DEXAMETHASONE SODIUM PHOSPHATE 10 MG/ML IJ SOLN
INTRAMUSCULAR | Status: DC | PRN
  Administered 2020-01-29: 4 mg via INTRAVENOUS

## 2020-01-29 MED ORDER — ONDANSETRON HCL 4 MG/2ML IJ SOLN
INTRAMUSCULAR | Status: DC | PRN
  Administered 2020-01-29: 4 mg via INTRAVENOUS

## 2020-01-29 MED ORDER — KETOROLAC TROMETHAMINE 30 MG/ML IJ SOLN
INTRAMUSCULAR | Status: DC | PRN
  Administered 2020-01-29: 30 mg via INTRAVENOUS

## 2020-01-29 MED ORDER — EPHEDRINE 5 MG/ML INJ
INTRAVENOUS | Status: AC
  Filled 2020-01-29: qty 10

## 2020-01-29 MED ORDER — AMOXICILLIN-POT CLAVULANATE 875-125 MG PO TABS: 1.0000 | ORAL_TABLET | Freq: Two times a day (BID) | ORAL | 0 refills | Status: AC

## 2020-01-29 MED ORDER — FENTANYL CITRATE (PF) 100 MCG/2ML IJ SOLN: 25.0000 ug | INTRAMUSCULAR | Status: DC | PRN

## 2020-01-29 MED ORDER — OXYCODONE HCL 5 MG PO TABS: 5.0000 mg | ORAL_TABLET | Freq: Once | ORAL | Status: DC | PRN

## 2020-01-29 MED ORDER — PHENYLEPHRINE 40 MCG/ML (10ML) SYRINGE FOR IV PUSH (FOR BLOOD PRESSURE SUPPORT)
PREFILLED_SYRINGE | INTRAVENOUS | Status: DC | PRN
  Administered 2020-01-29: 80 ug via INTRAVENOUS
  Administered 2020-01-29: 120 ug via INTRAVENOUS
  Administered 2020-01-29 (×4): 80 ug via INTRAVENOUS
  Administered 2020-01-29: 120 ug via INTRAVENOUS

## 2020-01-29 MED ORDER — OXYCODONE-ACETAMINOPHEN 5-325 MG PO TABS: 1.0000 | ORAL_TABLET | ORAL | 0 refills | Status: DC | PRN

## 2020-01-29 MED ORDER — FENTANYL CITRATE (PF) 100 MCG/2ML IJ SOLN
INTRAMUSCULAR | Status: AC
  Filled 2020-01-29: qty 2

## 2020-01-29 MED ORDER — OXYCODONE HCL 5 MG/5ML PO SOLN: 5.0000 mg | Freq: Once | ORAL | Status: DC | PRN

## 2020-01-29 MED ORDER — ARTIFICIAL TEARS OPHTHALMIC OINT
TOPICAL_OINTMENT | OPHTHALMIC | Status: AC
  Filled 2020-01-29: qty 3.5

## 2020-01-29 MED ORDER — LIDOCAINE 2% (20 MG/ML) 5 ML SYRINGE
INTRAMUSCULAR | Status: AC
  Filled 2020-01-29: qty 5

## 2020-01-29 MED ORDER — FENTANYL CITRATE (PF) 100 MCG/2ML IJ SOLN
INTRAMUSCULAR | Status: DC | PRN
  Administered 2020-01-29: 25 ug via INTRAVENOUS
  Administered 2020-01-29: 50 ug via INTRAVENOUS
  Administered 2020-01-29: 25 ug via INTRAVENOUS

## 2020-01-29 MED ORDER — LACTATED RINGERS IV SOLN: INTRAVENOUS | Status: DC

## 2020-01-29 MED ORDER — GENTAMICIN SULFATE 40 MG/ML IJ SOLN
5.0000 mg/kg | INTRAVENOUS | Status: AC
  Administered 2020-01-29: 470 mg via INTRAVENOUS
  Filled 2020-01-29: qty 11.75

## 2020-01-29 MED ORDER — SODIUM CHLORIDE (PF) 0.9 % IJ SOLN
INTRAMUSCULAR | Status: DC | PRN
  Administered 2020-01-29: 20 mL

## 2020-01-29 SURGICAL SUPPLY — 62 items
APPLICATOR COTTON TIP 6 STRL (MISCELLANEOUS) ×1 IMPLANT
APPLICATOR COTTON TIP 6IN STRL (MISCELLANEOUS) ×2
BAG DECANTER FOR FLEXI CONT (MISCELLANEOUS) ×2 IMPLANT
BAG URINE DRAIN 2000ML AR STRL (UROLOGICAL SUPPLIES) IMPLANT
BLADE CLIPPER SENSICLIP SURGIC (BLADE) IMPLANT
BLADE SURG 15 STRL LF DISP TIS (BLADE) ×1 IMPLANT
BLADE SURG 15 STRL SS (BLADE) ×1
BNDG GAUZE ELAST 4 BULKY (GAUZE/BANDAGES/DRESSINGS) ×2 IMPLANT
CATH FOLEY 2WAY SLVR  5CC 16FR (CATHETERS)
CATH FOLEY 2WAY SLVR 5CC 16FR (CATHETERS) IMPLANT
COVER BACK TABLE 60X90IN (DRAPES) ×2 IMPLANT
COVER MAYO STAND STRL (DRAPES) ×2 IMPLANT
COVER PROBE W GEL 5X96 (DRAPES) IMPLANT
COVER WAND RF STERILE (DRAPES) ×2 IMPLANT
DERMABOND ADVANCED (GAUZE/BANDAGES/DRESSINGS) ×1
DERMABOND ADVANCED .7 DNX12 (GAUZE/BANDAGES/DRESSINGS) ×1 IMPLANT
DISSECTOR ROUND CHERRY 3/8 STR (MISCELLANEOUS) IMPLANT
DRAIN PENROSE 0.5X18 (DRAIN) ×2 IMPLANT
DRAPE LAPAROTOMY 100X72 PEDS (DRAPES) ×2 IMPLANT
DRSG TEGADERM 4X4.75 (GAUZE/BANDAGES/DRESSINGS) IMPLANT
ELECT NEEDLE BLADE 2-5/6 (NEEDLE) ×2 IMPLANT
ELECT REM PT RETURN 9FT ADLT (ELECTROSURGICAL) ×2
ELECTRODE REM PT RTRN 9FT ADLT (ELECTROSURGICAL) ×1 IMPLANT
GLOVE BIO SURGEON STRL SZ 6.5 (GLOVE) ×2 IMPLANT
GLOVE BIO SURGEON STRL SZ8 (GLOVE) ×2 IMPLANT
GLOVE BIOGEL PI IND STRL 6.5 (GLOVE) ×1 IMPLANT
GLOVE BIOGEL PI IND STRL 7.5 (GLOVE) ×1 IMPLANT
GLOVE BIOGEL PI INDICATOR 6.5 (GLOVE) ×1
GLOVE BIOGEL PI INDICATOR 7.5 (GLOVE) ×1
GOWN STRL REUS W/ TWL LRG LVL3 (GOWN DISPOSABLE) ×1 IMPLANT
GOWN STRL REUS W/ TWL XL LVL3 (GOWN DISPOSABLE) ×1 IMPLANT
GOWN STRL REUS W/TWL LRG LVL3 (GOWN DISPOSABLE) ×3 IMPLANT
GOWN STRL REUS W/TWL XL LVL3 (GOWN DISPOSABLE) ×1
KIT TURNOVER CYSTO (KITS) ×2 IMPLANT
NEEDLE HYPO 25X1 1.5 SAFETY (NEEDLE) ×2 IMPLANT
NS IRRIG 500ML POUR BTL (IV SOLUTION) ×2 IMPLANT
PENCIL BUTTON HOLSTER BLD 10FT (ELECTRODE) ×2 IMPLANT
PROSTHESIS TESTICULAR NAC LRG (Urological Implant) ×1 IMPLANT
SET BASIN DAY SURGERY F.S. (CUSTOM PROCEDURE TRAY) ×2 IMPLANT
SUPPORT SCROTAL LG STRP (MISCELLANEOUS) ×2 IMPLANT
SUT CHROMIC 3 0 SH 27 (SUTURE) ×2 IMPLANT
SUT CHROMIC 4 0 PS 2 18 (SUTURE) IMPLANT
SUT CHROMIC 4 0 SH 27 (SUTURE) IMPLANT
SUT CHROMIC GUT AB #0 18 (SUTURE) IMPLANT
SUT MNCRL AB 4-0 PS2 18 (SUTURE) ×2 IMPLANT
SUT PROLENE 4 0 RB 1 (SUTURE) ×1
SUT PROLENE 4-0 RB1 .5 CRCL 36 (SUTURE) ×1 IMPLANT
SUT SILK 0 SH 30 (SUTURE) IMPLANT
SUT SILK 0 TIES 10X30 (SUTURE) IMPLANT
SUT VIC AB 0 SH 27 (SUTURE) IMPLANT
SUT VIC AB 2-0 SH 27 (SUTURE) ×1
SUT VIC AB 2-0 SH 27XBRD (SUTURE) ×1 IMPLANT
SUT VICRYL 2 0 18  UND BR (SUTURE)
SUT VICRYL 2 0 18 UND BR (SUTURE) IMPLANT
SYR 30ML LL (SYRINGE) IMPLANT
SYR CONTROL 10ML LL (SYRINGE) ×2 IMPLANT
TESTICULAR PROSTHESIS NAC LRG (Urological Implant) ×2 IMPLANT
TOWEL OR 17X26 10 PK STRL BLUE (TOWEL DISPOSABLE) ×4 IMPLANT
TRAY DSU PREP LF (CUSTOM PROCEDURE TRAY) ×2 IMPLANT
TUBE CONNECTING 12X1/4 (SUCTIONS) IMPLANT
WATER STERILE IRR 500ML POUR (IV SOLUTION) IMPLANT
YANKAUER SUCT BULB TIP NO VENT (SUCTIONS) IMPLANT

## 2020-01-29 NOTE — H&P (Signed)
Urology Admission H&P  Chief Complaint: left malfunctioning testis prosthesis  History of Present Illness: [name] is a 44 yo with a hx of left orchiectomy and testis prosthesis placement 30 years ago. Recently the prosthesis decreased in size and was found to have a leak. He presents today for replacement. No fevers/chills/sweats  Past Medical History:  Diagnosis Date  . Acne    on back taking doxycycline for  . Alcoholism ([place])    sober since 04/2018  . Anal lesion    nonhealing  . Anxiety   . Depression   . DM type 2 (diabetes mellitus, type 2) ([place])   . ED (erectile dysfunction)   . GAD (generalized anxiety disorder)   . GERD (gastroesophageal reflux disease)   . Gynecomastia, male    followed by dr Shellia Cleverly  . High serum estradiol    resolved  . History of anal fissures   . History of kidney stones   . History of small bowel obstruction 2004  . Hypertension   . IBS (irritable bowel syndrome)   . Mixed hyperlipidemia   . OSA on CPAP    per last study 07-31-2013  severe osa  . Primary hypogonadism in male    endocrinologist-  dr Shellia Cleverly  . Sleep apnea   . Wears glasses    Past Surgical History:  Procedure Laterality Date  . CARDIOVASCULAR STRESS TEST  10/05/2009   normal nuclear study w/ no ischemia/  normal LV function and wall motion , ef 71%  . COLONOSCOPY  last one   2004, april 2021  . EVALUATION UNDER ANESTHESIA WITH ANAL FISTULECTOMY N/A 02/13/2018   Procedure: ANAL EXAM UNDER ANESTHESIA WITH BIOPSY;  Surgeon: [name], MD;  Location: [facility];  Service: General;  Laterality: N/A;  . ORCHIECTOMY Left 1991   w/ placement prosthesis (for torsion)  . URETEROLITHOTOMY  1999    Home Medications:  Current Facility-Administered Medications  Medication Dose Route Frequency Provider Last Rate Last Admin  . gentamicin (GARAMYCIN) 470 mg in dextrose 5 % 100 mL IVPB  5 mg/kg (Adjusted) Intravenous 30 min Pre-Op Helyn Schwan, Candee Furbish, MD      .  lactated ringers infusion   Intravenous Continuous Oleta Mouse, MD 50 mL/hr at 01/29/20 1219 New Bag at 01/29/20 1219  . vancomycin (VANCOREADY) IVPB 1500 mg/300 mL  1,500 mg Intravenous 60 min Pre-Op Cleon Gustin, MD 150 mL/hr at 01/29/20 1221 1,500 mg at 01/29/20 1221   Allergies:  Allergies  Allergen Reactions  . Atorvastatin Other (See Comments)    Muscle fatigue  . Bee Venom Swelling  . Sulfamethoxazole-Trimethoprim Hives    Bactrim     Family History  Problem Relation Age of Onset  . Cancer Mother        breast cancer: stage I  . Hypertension Mother   . Irritable bowel syndrome Mother   . Heart disease Maternal Grandfather   . Diabetes Paternal Grandmother   . Colon cancer Maternal Aunt        great aunt  . Esophageal cancer Neg Hx   . Rectal cancer Neg Hx   . Stomach cancer Neg Hx    Social History:  reports that he quit smoking about 10 years ago. His smoking use included cigarettes. He has a 10.00 pack-year smoking history. He has never used smokeless tobacco. He reports previous alcohol use. He reports that he does not use drugs.  Review of Systems  All other systems reviewed and are negative.  Physical Exam:  Vital signs in last 24 hours: Temp:  [98.7 F (37.1 C)] 98.7 F (37.1 C) (06/25 1201) Pulse Rate:  [81] 81 (06/25 1201) Resp:  [18] 18 (06/25 1201) BP: (134)/(76) 134/76 (06/25 1201) SpO2:  [99 %] 99 % (06/25 1201) Weight:  [118.1 kg] 118.1 kg (06/25 1201) Physical Exam  Vitals reviewed. Constitutional: He is oriented to person, place, and time.  HENT:  Head: Normocephalic and atraumatic.  Eyes: Pupils are equal, round, and reactive to light.  Cardiovascular: Normal rate and regular rhythm.  Respiratory: Effort normal. No respiratory distress.  GI: Normal appearance. He exhibits no distension.  Musculoskeletal:        General: Normal range of motion.     Cervical back: Normal range of motion and neck supple.  Neurological: He is  alert and oriented to person, place, and time.  Skin: Skin is warm and dry.  Psychiatric: His behavior is normal. Mood, judgment and thought content normal.    Laboratory Data:  Results for orders placed or performed during the hospital encounter of 01/29/20 (from the past 24 hour(s))  I-STAT, chem 8     Status: Abnormal   Collection Time: 01/29/20 12:19 PM  Result Value Ref Range   Sodium 145 135 - 145 mmol/L   Potassium 4.0 3.5 - 5.1 mmol/L   Chloride 105 98 - 111 mmol/L   BUN 21 (H) 6 - 20 mg/dL   Creatinine, Ser 1.20 0.61 - 1.24 mg/dL   Glucose, Bld 102 (H) 70 - 99 mg/dL   Calcium, Ion 1.23 1.15 - 1.40 mmol/L   TCO2 25 22 - 32 mmol/L   Hemoglobin 16.3 13.0 - 17.0 g/dL   HCT 48.0 39 - 52 %   Recent Results (from the past 240 hour(s))  SARS CORONAVIRUS 2 (TAT 6-24 HRS) Nasopharyngeal Nasopharyngeal Swab     Status: None   Collection Time: 01/26/20 10:10 AM   Specimen: Nasopharyngeal Swab  Result Value Ref Range Status   SARS Coronavirus 2 NEGATIVE NEGATIVE Final    Comment: (NOTE) SARS-CoV-2 target nucleic acids are NOT DETECTED.  The SARS-CoV-2 RNA is generally detectable in upper and lower respiratory specimens during the acute phase of infection. Negative results do not preclude SARS-CoV-2 infection, do not rule out co-infections with other pathogens, and should not be used as the sole basis for treatment or other patient management decisions. Negative results must be combined with clinical observations, patient history, and epidemiological information. The expected result is Negative.  Fact Sheet for Patients: SugarRoll.be  Fact Sheet for Healthcare Providers: https://www.woods-mathews.com/  This test is not yet approved or cleared by the [place] FDA and  has been authorized for detection and/or diagnosis of SARS-CoV-2 by FDA under an Emergency Use Authorization (EUA). This EUA will remain  in effect (meaning this  test can be used) for the duration of the COVID-19 declaration under Se ction 564(b)(1) of the Act, 21 U.S.C. section 360bbb-3(b)(1), unless the authorization is terminated or revoked sooner.  Performed at [facility], [place] [address] [place], [place] [address]    Creatinine: Recent Labs    01/29/20 1219  CREATININE 1.20   Baseline Creatinine: 1.2  Impression/Assessment:  44yo with malfunctioning left testis prosthesis  Plan:  The risks/benefits/alternatives to left testis prosthesis replacement was explained to the patient and he understands and wishes to proceed with surgery  Nicolette Bang 01/29/2020, 1:07 PM

## 2020-01-29 NOTE — Anesthesia Preprocedure Evaluation (Addendum)
Anesthesia Evaluation  Patient identified by MRN, date of birth, ID band Patient awake    Reviewed: Allergy & Precautions, NPO status , Patient's Chart, lab work & pertinent test results, reviewed documented beta blocker date and time   History of Anesthesia Complications Negative for: history of anesthetic complications  Airway Mallampati: III  TM Distance: >3 FB Neck ROM: Full    Dental  (+) Dental Advisory Given, Teeth Intact   Pulmonary sleep apnea and Continuous Positive Airway Pressure Ventilation , former smoker,    Pulmonary exam normal        Cardiovascular hypertension, Pt. on medications and Pt. on home beta blockers Normal cardiovascular exam     Neuro/Psych PSYCHIATRIC DISORDERS Anxiety Depression negative neurological ROS     GI/Hepatic GERD  Medicated and Controlled,(+)     substance abuse  alcohol use,  IBS    Endo/Other  diabetes, Type 2, Oral Hypoglycemic Agents Gynecomastia, hypogonadism Obesity   Renal/GU negative Renal ROS     Musculoskeletal negative musculoskeletal ROS (+)   Abdominal   Peds  Hematology negative hematology ROS (+)   Anesthesia Other Findings Covid test negative   Reproductive/Obstetrics                            Anesthesia Physical Anesthesia Plan  ASA: III  Anesthesia Plan: General   Post-op Pain Management:    Induction: Intravenous  PONV Risk Score and Plan: 3 and Treatment may vary due to age or medical condition, Ondansetron, Midazolam and Dexamethasone  Airway Management Planned: LMA  Additional Equipment: None  Intra-op Plan:   Post-operative Plan: Extubation in OR  Informed Consent: I have reviewed the patients History and Physical, chart, labs and discussed the procedure including the risks, benefits and alternatives for the proposed anesthesia with the patient or authorized representative who has indicated his/her  understanding and acceptance.     Dental advisory given  Plan Discussed with: CRNA and Anesthesiologist  Anesthesia Plan Comments:        Anesthesia Quick Evaluation

## 2020-01-29 NOTE — Transfer of Care (Signed)
Immediate Anesthesia Transfer of Care Note  Patient: Tyler Gross  Procedure(s) Performed: EXCHANGE OF LEFT TESTICULAR PROSTHESIS (Left Scrotum)  Patient Location: PACU  Anesthesia Type:General  Level of Consciousness: awake, alert  and oriented  Airway & Oxygen Therapy: Patient Spontanous Breathing and Patient connected to face mask oxygen  Post-op Assessment: Report given to RN and Post -op Vital signs reviewed and stable  Post vital signs: Reviewed and stable  Last Vitals:  Vitals Value Taken Time  BP 114/75   Temp    Pulse 85 01/29/20 1454  Resp 13 01/29/20 1454  SpO2 96 % 01/29/20 1454  Vitals shown include unvalidated device data.  Last Pain:  Vitals:   01/29/20 1201  TempSrc: Oral  PainSc: 0-No pain      Patients Stated Pain Goal: 5 (21/03/12 8118)  Complications: No complications documented.

## 2020-01-29 NOTE — Anesthesia Procedure Notes (Signed)
Procedure Name: LMA Insertion Date/Time: 01/29/2020 1:56 PM Performed by: Genelle Bal, CRNA Pre-anesthesia Checklist: Patient identified, Emergency Drugs available, Suction available and Patient being monitored Patient Re-evaluated:Patient Re-evaluated prior to induction Oxygen Delivery Method: Circle system utilized Preoxygenation: Pre-oxygenation with 100% oxygen Induction Type: IV induction Ventilation: Mask ventilation without difficulty LMA: LMA inserted LMA Size: 5.0 Number of attempts: 1 Airway Equipment and Method: Bite block Placement Confirmation: positive ETCO2 Tube secured with: Tape Dental Injury: Teeth and Oropharynx as per pre-operative assessment

## 2020-01-29 NOTE — Discharge Instructions (Signed)
Wound Care, Adult Taking care of your wound properly can help to prevent pain, infection, and scarring. It can also help your wound to heal more quickly. How to care for your wound Wound care      Follow instructions from your health care provider about how to take care of your wound. Make sure you: ? Wash your hands with soap and water before you change the bandage (dressing). If soap and water are not available, use hand sanitizer. ? Change your dressing as told by your health care provider. ? Leave stitches (sutures), skin glue, or adhesive strips in place. These skin closures may need to stay in place for 2 weeks or longer. If adhesive strip edges start to loosen and curl up, you may trim the loose edges. Do not remove adhesive strips completely unless your health care provider tells you to do that.  Check your wound area every day for signs of infection. Check for: ? Redness, swelling, or pain. ? Fluid or blood. ? Warmth. ? Pus or a bad smell.  Ask your health care provider if you should clean the wound with mild soap and water. Doing this may include: ? Using a clean towel to pat the wound dry after cleaning it. Do not rub or scrub the wound. ? Applying a cream or ointment. Do this only as told by your health care provider. ? Covering the incision with a clean dressing.  Ask your health care provider when you can leave the wound uncovered.  Keep the dressing dry until your health care provider says it can be removed. Do not take baths, swim, use a hot tub, or do anything that would put the wound underwater until your health care provider approves. Ask your health care provider if you can take showers. You may only be allowed to take sponge baths. Medicines   If you were prescribed an antibiotic medicine, cream, or ointment, take or use the antibiotic as told by your health care provider. Do not stop taking or using the antibiotic even if your condition improves.  Take  over-the-counter and prescription medicines only as told by your health care provider. If you were prescribed pain medicine, take it 30 or more minutes before you do any wound care or as told by your health care provider. General instructions  Return to your normal activities as told by your health care provider. Ask your health care provider what activities are safe.  Do not scratch or pick at the wound.  Do not use any products that contain nicotine or tobacco, such as cigarettes and e-cigarettes. These may delay wound healing. If you need help quitting, ask your health care provider.  Keep all follow-up visits as told by your health care provider. This is important.  Eat a diet that includes protein, vitamin A, vitamin C, and other nutrient-rich foods to help the wound heal. ? Foods rich in protein include meat, dairy, beans, nuts, and other sources. ? Foods rich in vitamin A include carrots and dark green, leafy vegetables. ? Foods rich in vitamin C include citrus, tomatoes, and other fruits and vegetables. ? Nutrient-rich foods have protein, carbohydrates, fat, vitamins, or minerals. Eat a variety of healthy foods including vegetables, fruits, and whole grains. Contact a health care provider if:  You received a tetanus shot and you have swelling, severe pain, redness, or bleeding at the injection site.  Your pain is not controlled with medicine.  You have redness, swelling, or pain around the wound.    You have fluid or blood coming from the wound.  Your wound feels warm to the touch.  You have pus or a bad smell coming from the wound.  You have a fever or chills.  You are nauseous or you vomit.  You are dizzy. Get help right away if:  You have a red streak going away from your wound.  The edges of the wound open up and separate.  Your wound is bleeding, and the bleeding does not stop with gentle pressure.  You have a rash.  You faint.  You have trouble  breathing. Summary  Always wash your hands with soap and water before changing your bandage (dressing).  To help with healing, eat foods that are rich in protein, vitamin A, vitamin C, and other nutrients.  Check your wound every day for signs of infection. Contact your health care provider if you suspect that your wound is infected. This information is not intended to replace advice given to you by your health care provider. Make sure you discuss any questions you have with your health care provider. Document Revised: 11/10/2018 Document Reviewed: 02/07/2016 Elsevier Patient Education  2020 [employer]  

## 2020-01-29 NOTE — Op Note (Signed)
Preoperative diagnosis: Left malfunctioning testis prosthesis  Postoperative diagnosis: same  Procedure: 1. Removal and replacement of left testis prosthesis  Attending: Nicolette Bang, MD  Anesthesia: General  History of blood loss: Minimal  Antibiotics: ancef  Drains: none  Specimens: 1. Left testicular prosthesis and surrounding capsule  Findings: partially deflated left testis prosthesis with surrounding fibrinous material. 20cc large testis prosthesis place din left hemiscrotum  Indications: Patient is a 44 year old male with a history of left testis prosthesis that was placed 30 years ago. The prosthesis was noted to be decreasing in size and developed nodularity around the prosthesis. We discussed the treatment options the patient elected to proceed with left testis prosthesis revision  Procedure in detail: Prior to procedure consent was obtained.  Patient was brought to the operating room and a brief timeout was done to ensure correct patient, correct procedure, correct site.  General anesthesia was administered and patient was placed in supine position.  His genitalia was then prepped and draped in usual sterile fashion.  A 3 cm incision was made in the left hemiscrotum.  We dissected down to the prosthesis and using electrocautery we removed the capsule around the prosthesis and the prosthesis. Hemostasis was obtained with electrocautery. We then irrigated the left hemiscrotum with antibiotic irrigation. We then placed the testicular prosthesis in to the left hemiscrotum we secured the prosthesis to the superiorly with a 3-0 prolene through the dartos. We thenclosed the overlying dartos in 2 layers with 3-0 vicryl in a running fashion. The skin was then closed with 4-0 monocryl in a running fashion. Dermabond was placed on the incision.  A dressing was then applied to the incision.  We then placed a scrotal fluff and this then concluded the procedure which was well tolerated by the  patient.  Complications: None  Condition: Stable, extubated, transferred to PACU.  Plan: Patient is to be discharged home.  He is to follow up in 2 weeks for wound check.

## 2020-01-30 NOTE — Anesthesia Postprocedure Evaluation (Signed)
Anesthesia Post Note  Patient: Tyler Gross  Procedure(s) Performed: EXCHANGE OF LEFT TESTICULAR PROSTHESIS (Left Scrotum)     Patient location during evaluation: PACU Anesthesia Type: General Level of consciousness: awake and alert Pain management: pain level controlled Vital Signs Assessment: post-procedure vital signs reviewed and stable Respiratory status: spontaneous breathing, nonlabored ventilation and respiratory function stable Cardiovascular status: blood pressure returned to baseline and stable Postop Assessment: no apparent nausea or vomiting Anesthetic complications: no   No complications documented.  Last Vitals:  Vitals:   01/29/20 1530 01/29/20 1615  BP: 115/62 118/71  Pulse: 81 82  Resp: 14 15  Temp:  36.6 C  SpO2: 96% 97%    Last Pain:  Vitals:   01/29/20 1615  TempSrc:   PainSc: Raymond Brogan [place]

## 2020-02-01 ENCOUNTER — Encounter (HOSPITAL_BASED_OUTPATIENT_CLINIC_OR_DEPARTMENT_OTHER): Payer: Self-pay | Admitting: Urology

## 2020-02-02 LAB — SURGICAL PATHOLOGY

## 2020-02-12 DIAGNOSIS — E291 Testicular hypofunction: Secondary | ICD-10-CM | POA: Diagnosis not present

## 2020-02-17 ENCOUNTER — Encounter: Payer: Self-pay | Admitting: Family Medicine

## 2020-02-17 ENCOUNTER — Other Ambulatory Visit: Payer: Self-pay

## 2020-02-17 ENCOUNTER — Ambulatory Visit (INDEPENDENT_AMBULATORY_CARE_PROVIDER_SITE_OTHER): Payer: 59 | Admitting: Family Medicine

## 2020-02-17 DIAGNOSIS — E781 Pure hyperglyceridemia: Secondary | ICD-10-CM

## 2020-02-17 MED ORDER — GEMFIBROZIL 600 MG PO TABS: ORAL_TABLET | ORAL | 2 refills | Status: DC

## 2020-02-17 NOTE — Assessment & Plan Note (Signed)
Tolerating gemfibrozil well without side effects.  Lab Results  Component Value Date   CHOL 143 08/19/2019   HDL 42.80 08/19/2019   LDLCALC 77 08/19/2019   TRIG 119.0 08/19/2019   CHOLHDL 3 08/19/2019   Continue current dosing, refilled.

## 2020-02-17 NOTE — Patient Instructions (Signed)
Good luck with school.  See me again in about 6 months.  Let me know if you need anything in the meantime.

## 2020-02-17 NOTE — Progress Notes (Signed)
Tyler Gross - 44 y.o. male MRN [id number]  Date of birth: 06/28/1976  Subjective Chief Complaint  Patient presents with  . Medication Refill    HPI Tyler Gross is a 44 y.o. male here today for follow up of elevated triglycerides.  He is currently taking gemfibrozil for this.  He is tolerating well.  He is taking crestor as well in addition for elevated cholesterol.  His TG have improved quite a bit since he quit drinking as well.    Since last visit with me he had testicular prosthetic removed and replaced, recovering well from this.   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    ROS:  A comprehensive ROS was completed and negative except as noted per HPI  Allergies  Allergen Reactions  . Atorvastatin Other (See Comments)    Muscle fatigue  . Bee Venom Swelling  . Sulfamethoxazole-Trimethoprim Hives    Bactrim     Past Medical History:  Diagnosis Date  . Acne    on back taking doxycycline for  . Alcoholism ([place])    sober since 04/2018  . Anal lesion    nonhealing  . Anxiety   . Depression   . DM type 2 (diabetes mellitus, type 2) ([place])   . ED (erectile dysfunction)   . GAD (generalized anxiety disorder)   . GERD (gastroesophageal reflux disease)   . Gynecomastia, male    followed by dr Shellia Cleverly  . High serum estradiol    resolved  . History of anal fissures   . History of kidney stones   . History of small bowel obstruction 2004  . Hypertension   . IBS (irritable bowel syndrome)   . Mixed hyperlipidemia   . OSA on CPAP    per last study 07-31-2013  severe osa  . Primary hypogonadism in male    endocrinologist-  dr Shellia Cleverly  . Sleep apnea   . Wears glasses     Past Surgical History:  Procedure Laterality Date  . CARDIOVASCULAR STRESS TEST  10/05/2009   normal nuclear study w/ no ischemia/  normal LV function and wall motion , ef 71%  . COLONOSCOPY  last one   2004, april 2021  . EVALUATION  UNDER ANESTHESIA WITH ANAL FISTULECTOMY N/A 02/13/2018   Procedure: ANAL EXAM UNDER ANESTHESIA WITH BIOPSY;  Surgeon: [name], MD;  Location: [facility];  Service: General;  Laterality: N/A;  . ORCHIECTOMY Left 1991   w/ placement prosthesis (for torsion)  . TESTICULAR EXPLORATION Left 01/29/2020   Procedure: EXCHANGE OF LEFT TESTICULAR PROSTHESIS;  Surgeon: Cleon Gustin, MD;  Location: [facility];  Service: Urology;  Laterality: Left;  . URETEROLITHOTOMY  1999    Social History   Socioeconomic History  . Marital status: Single    Spouse name: Not on file  . Number of children: Not on file  . Years of education: Not on file  . Highest education level: Not on file  Occupational History  . Occupation: Therapist, sports   Tobacco Use  . Smoking status: Former Smoker    Packs/day: 1.00    Years: 10.00    Pack years: 10.00    Types: Cigarettes    Quit date: 02/07/2009    Years since quitting: 11.0  . Smokeless tobacco: Never Used  Vaping Use  . Vaping Use: Never used  Substance and Sexual Activity  . Alcohol use: Not  Currently    Comment: sober 04/09/2018  . Drug use: No  . Sexual activity: Yes    Partners: Male  Other Topics Concern  . Not on file  Social History Narrative   Lives with partner   Regular exercise: no   Caffeine use: 1 large cup of coffee daily; 2 to 3 sodas in the evening   Social Determinants of Health   Financial Resource Strain:   . Difficulty of Paying Living Expenses:   Food Insecurity:   . Worried About Charity fundraiser in the Last Year:   . Arboriculturist in the Last Year:   Transportation Needs:   . Film/video editor (Medical):   Marland Kitchen Lack of Transportation (Non-Medical):   Physical Activity:   . Days of Exercise per Week:   . Minutes of Exercise per Session:   Stress:   . Feeling of Stress :   Social Connections:   . Frequency of Communication with Friends and Family:   . Frequency of Social Gatherings  with Friends and Family:   . Attends Religious Services:   . Active Member of Clubs or Organizations:   . Attends Archivist Meetings:   Marland Kitchen Marital Status:     Family History  Problem Relation Age of Onset  . Cancer Mother        breast cancer: stage I  . Hypertension Mother   . Irritable bowel syndrome Mother   . Heart disease Maternal Grandfather   . Diabetes Paternal Grandmother   . Colon cancer Maternal Aunt        great aunt  . Esophageal cancer Neg Hx   . Rectal cancer Neg Hx   . Stomach cancer Neg Hx     Health Maintenance  Topic Date Due  . Hepatitis C Screening  Never done  . FOOT EXAM  Never done  . OPHTHALMOLOGY EXAM  Never done  . INFLUENZA VACCINE  03/06/2020  . HEMOGLOBIN A1C  05/21/2020  . TETANUS/TDAP  01/27/2024  . PNEUMOCOCCAL POLYSACCHARIDE VACCINE AGE 56-64 HIGH RISK  Completed  . COVID-19 Vaccine  Completed  . HIV Screening  Completed     ----------------------------------------------------------------------------------------------------------------------------------------------------------------------------------------------------------------- Physical Exam BP 122/76 (BP Location: Left Arm, Patient Position: Sitting, Cuff Size: Large)   Pulse 72   Temp 97.8 F (36.6 C) (Temporal)   Ht 6' 0.05" (1.83 m)   Wt 267 lb 8 oz (121.3 kg)   SpO2 97%   BMI 36.23 kg/m   Physical Exam Constitutional:      Appearance: Normal appearance.  HENT:     Head: Normocephalic and atraumatic.  Cardiovascular:     Rate and Rhythm: Normal rate and regular rhythm.  Neurological:     Mental Status: He is alert.  Psychiatric:        Mood and Affect: Mood normal.        Behavior: Behavior normal.     ------------------------------------------------------------------------------------------------------------------------------------------------------------------------------------------------------------------- Assessment and  Plan  Hypertriglyceridemia Tolerating gemfibrozil well without side effects.  Lab Results  Component Value Date   CHOL 143 08/19/2019   HDL 42.80 08/19/2019   LDLCALC 77 08/19/2019   TRIG 119.0 08/19/2019   CHOLHDL 3 08/19/2019   Continue current dosing, refilled.    Meds ordered this encounter  Medications  . gemfibrozil (LOPID) 600 MG tablet    Sig: TAKE ONE TABLET BY MOUTH TWICE A DAY BEFORE A MEAL    Dispense:  180 tablet    Refill:  2  Return in about 6 months (around 08/19/2020) for HTN/anxiety.    This visit occurred during the SARS-CoV-2 public health emergency.  Safety protocols were in place, including screening questions prior to the visit, additional usage of staff PPE, and extensive cleaning of exam room while observing appropriate contact time as indicated for disinfecting solutions.

## 2020-02-23 ENCOUNTER — Encounter: Payer: Self-pay | Admitting: Gastroenterology

## 2020-02-23 ENCOUNTER — Ambulatory Visit (INDEPENDENT_AMBULATORY_CARE_PROVIDER_SITE_OTHER): Payer: 59 | Admitting: Gastroenterology

## 2020-02-23 VITALS — BP 126/72 | HR 73 | Ht 72.0 in | Wt 267.5 lb

## 2020-02-23 DIAGNOSIS — K648 Other hemorrhoids: Secondary | ICD-10-CM

## 2020-02-23 DIAGNOSIS — K200 Eosinophilic esophagitis: Secondary | ICD-10-CM

## 2020-02-23 DIAGNOSIS — K219 Gastro-esophageal reflux disease without esophagitis: Secondary | ICD-10-CM

## 2020-02-23 DIAGNOSIS — K641 Second degree hemorrhoids: Secondary | ICD-10-CM

## 2020-02-23 NOTE — Progress Notes (Signed)
P  Chief Complaint:    Symptomatic Internal Hemorrhoids; Hemorrhoid Band Ligation  GI History: 44y.o.malewith a history of GAD, diabetes, IBS, OSA (on CPAP), hypertension,initially seen in the GI clinic on 09/14/2019 for evaluation of anal fissure, hematochezia, and change in bowel habits/constipation, along with chronic reflux and intermittent pill dysphagia.  Anal fissure resolved with nifedipine with lidocaine, but due to ongoing hematochezia, evaluated colonoscopy, which was notable for internal hemorrhoids as below.  Reflux well controlled on pantoprazole BID. Actually reflux sxs stopped afterquitting EtOH 04/2018. Pill dysphagia improved since EGD with dil and starting Rx.  EGD with esophageal eosinophilia as below, favoring EoE.  Planning repeat EGD to eval for histologic/endoscopic response.    Interestingly, twin brother and mother with EoE.  -Colonoscopy (10/08/2019, Dr. Bryan Lemma): Sigmoid diverticulosis, internal hemorrhoids, otherwise normal with normal TI. Repeat 10 years -EGD (10/08/2019, Dr. Bryan Lemma): Longitudinal furrows, felinization (biopsy with 20eoslower/15 upper per hpf. Favor EoEbased on endoscopic appearance), empiric 54FrMaloney dilation, LA Grade B esophagitis, non-H. pylori gastritis. Repeat EGD in 8 weeks  -12/07/2019: Successful banding of the LL hemorrhoid -02/23/2020: Presents for hemorrhoid banding #2  Previous GI work-up as follows: In 2003/2004 developed abdominal pain and was admitted with SBO, treated with rest only (no NGT or surgery). Colonoscopy at that time in [place], [place] with suspicion for Crohn's Disease. Treated with Remicade (only 3 infusions) and steroids. Symptoms improved, and followed up with a different GI, and was felt this was not Crohn's Disease and was diagnosed with Microscopic Colitis on a second colonoscopy. Symptoms eventually resolved.   Diagnosed with anal fissure and 2019. Referred to Dr. [name] at  [facility], with EUAin 7/2019n/fanal fissure. 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 Was treated with topical nifedipine with lidocaine with clinical improvement.  HPI:    Patient is a 44 y.o. malewith a history of symptomatic internal hemorrhoids presenting to the Gastroenterology Clinic for follow-up and ongoing treatment. The patient presents with symptomatic grade 2 hemorrhoids, unresponsive to maximal medical therapy, requesting rubber band ligation of symptomatic hemorrhoidal disease.  Report clinical improvement after banding of, with reduced BRBPR.  Otherwise no issues with the first banding.  No change in medical or surgical history, medications, allergies, social history since last appointment with me.   Review of systems:     No chest pain, no SOB, no fevers, no urinary sx   Past Medical History:  Diagnosis Date  . Acne    on back taking doxycycline for  . Alcoholism ([place])    sober since 04/2018  . Anal lesion    nonhealing  . Anxiety   . Depression   . DM type 2 (diabetes mellitus, type 2) ([place])   . ED (erectile dysfunction)   . GAD (generalized anxiety disorder)   . GERD (gastroesophageal reflux disease)   . Gynecomastia, male    followed by dr Shellia Cleverly  . High serum estradiol    resolved  . History of anal fissures   . History of kidney stones   . History of small bowel obstruction 2004  . Hypertension   . IBS (irritable bowel syndrome)   . Mixed hyperlipidemia   . OSA on CPAP    per last study 07-31-2013  severe osa  . Primary hypogonadism in male    endocrinologist-  dr Shellia Cleverly  . Sleep apnea   . Wears glasses     Patient's surgical history, family medical history, social history,  medications and allergies were all reviewed in Epic    Current Outpatient Medications  Medication Sig Dispense Refill  . amLODipine (NORVASC) 5 MG tablet Take 1 tablet (5 mg total) by mouth  every morning. 90 tablet 2  . anastrozole (ARIMIDEX) 1 MG tablet Take 1 mg by mouth every morning.     Marland Kitchen atenolol (TENORMIN) 100 MG tablet TAKE ONE TABLET BY MOUTH EVERY MORNING 90 tablet 1  . Calcium Carbonate-Vitamin D (CALCIUM-VITAMIN D) 600-125 MG-UNIT TABS 1 tablet daily.    . clomiPHENE (CLOMID) 50 MG tablet Take 0.5 tablets (25 mg total) by mouth daily. (Patient taking differently: Take 25 mg by mouth every Monday, Wednesday, and Friday. ) 15 tablet 11  . DULoxetine (CYMBALTA) 60 MG capsule Take 1 capsule (60 mg total) by mouth daily. For mood control 30 capsule 0  . gabapentin (NEURONTIN) 600 MG tablet Take 600 mg by mouth 2 (two) times daily.     Marland Kitchen gemfibrozil (LOPID) 600 MG tablet TAKE ONE TABLET BY MOUTH TWICE A DAY BEFORE A MEAL 180 tablet 2  . lisinopril-hydrochlorothiazide (ZESTORETIC) 20-25 MG tablet TAKE ONE TABLET BY MOUTH DAILY 90 tablet 2  . metFORMIN (GLUCOPHAGE) 500 MG tablet TAKE ONE TABLET BY MOUTH TWICE A DAY WITH A MEAL 180 tablet 2  . Multiple Vitamin (MULTIVITAMIN WITH MINERALS) TABS tablet Take 1 tablet by mouth daily.    . Omega-3 Fatty Acids (FISH OIL) 1000 MG CPDR Take by mouth.    Marland Kitchen OVER THE COUNTER MEDICATION Cranberry 1 daily    . OVER THE COUNTER MEDICATION Vitamin b 12 daily    . pantoprazole (PROTONIX) 40 MG tablet Take 1 tablet (40 mg total) by mouth 2 (two) times daily. 90 tablet 3  . potassium chloride (KLOR-CON) 10 MEQ tablet Take 1 tablet (10 mEq total) by mouth daily. 90 tablet 2  . Probiotic Product (PROBIOTIC PO) Take 1 tablet by mouth daily.    . rosuvastatin (CRESTOR) 10 MG tablet Take 1 tablet (10 mg total) by mouth at bedtime. 90 tablet 2  . UNABLE TO FIND Vitamin d daily     No current facility-administered medications for this visit.    Physical Exam:     BP 126/72   Pulse 73   Ht 6' (1.829 m)   Wt 267 lb 8 oz (121.3 kg)   BMI 36.28 kg/m   GENERAL:  Pleasant male in NAD PSYCH: : Cooperative, normal affect Rectal exam: Sensation  intact and preserved anal wink.  Grade 2 hemorrhoids noted in RA/RP positions.  No external anal fissures noted. Normal sphincter tone. No palpable mass. No blood on the exam glove. (Chaperone: Curlene Labrum, CMA).   IMPRESSION and PLAN:    #1.  Symptomatic internal hemorrhoids: PROCEDURE NOTE: The patient presents with symptomatic grade 2 hemorrhoids, unresponsive to maximal medical therapy, requesting rubber band ligation of symptomatic hemorrhoidal disease.  All risks, benefits and alternative forms of therapy were described and informed consent was obtained.  In the Left Lateral Decubitus position, anoscopic examination revealed grade 2 hemorrhoids in the RA and RP position(s).  The anorectum was pre-medicated with RectiCare. The decision was made to band the RA internal hemorrhoid, and the [place] was used to perform band ligation without complication.  Digital anorectal examination was then performed to assure proper positioning of the band, and to adjust the banded tissue as required.  The patient was discharged home without pain or other issues.  Dietary and behavioral recommendations were given and  along with follow-up instructions.     The following adjunctive treatments were recommended:  -Resume high-fiber diet with fiber supplement (i.e. Citrucel or Benefiber) with goal for soft stools without straining to have a BM. -Resume adequate fluid intake.  The patient will return in 2-4 for  follow-up and possible additional banding as required. No complications were encountered and the patient tolerated the procedure well.  2)Esophageal eosinophilia 3) GERD  -Will need repeat EGD with biopsies.  Plan to arrange for follow-up after completion hemorrhoid banding series.  Otherwise doing well clinically - Resume PPI  4) Constipation - Constipation improved -Continue current therapy along with high-fiber diet/fiber supplementation      Lavena Bullion ,DO, FACG  02/23/2020, 10:52 AM

## 2020-02-23 NOTE — Patient Instructions (Addendum)
If you are age 45 or older, your body mass index should be between 23-30. Your Body mass index is 36.28 kg/m. If this is out of the aforementioned range listed, please consider follow up with your Primary Care Provider.  If you are age 76 or younger, your body mass index should be between 19-25. Your Body mass index is 36.28 kg/m. If this is out of the aformentioned range listed, please consider follow up with your Primary Care Provider.   HEMORRHOID BANDING PROCEDURE    FOLLOW-UP CARE   1. The procedure you have had should have been relatively painless since the banding of the area involved does not have nerve endings and there is no pain sensation.  The rubber band cuts off the blood supply to the hemorrhoid and the band may fall off as soon as 48 hours after the banding (the band may occasionally be seen in the toilet bowl following a bowel movement). You may notice a temporary feeling of fullness in the rectum which should respond adequately to plain Tylenol or Motrin.  2. Following the banding, avoid strenuous exercise that evening and resume full activity the next day.  A sitz bath (soaking in a warm tub) or bidet is soothing, and can be useful for cleansing the area after bowel movements.     3. To avoid constipation, take two tablespoons of natural wheat bran, natural oat bran, flax, Benefiber or any over the counter fiber supplement and increase your water intake to 7-8 glasses daily.    4. Unless you have been prescribed anorectal medication, do not put anything inside your rectum for two weeks: No suppositories, enemas, fingers, etc.  5. Occasionally, you may have more bleeding than usual after the banding procedure.  This is often from the untreated hemorrhoids rather than the treated one.  Don't be concerned if there is a tablespoon or so of blood.  If there is more blood than this, lie flat with your bottom higher than your head and apply an ice pack to the area. If the bleeding  does not stop within a half an hour or if you feel faint, call our office at [phone] or go to the emergency room.  6. Problems are not common; however, if there is a substantial amount of bleeding, severe pain, chills, fever or difficulty passing urine (very rare) or other problems, you should call us at [phone] or report to the nearest emergency room.  7. Do not stay seated continuously for more than 2-3 hours for a day or two after the procedure.  Tighten your buttock muscles 10-15 times every two hours and take 10-15 deep breaths every 1-2 hours.  Do not spend more than a few minutes on the toilet if you cannot empty your bowel; instead re-visit the toilet at a later time.    Follow up with me on 04/06/20 at 11:20 am  It was a pleasure to see you today!  Vito Cirigliano, D.O.

## 2020-04-06 ENCOUNTER — Ambulatory Visit: Payer: 59 | Admitting: Gastroenterology

## 2020-04-12 ENCOUNTER — Other Ambulatory Visit: Payer: Self-pay

## 2020-04-12 ENCOUNTER — Telehealth: Payer: Self-pay | Admitting: Gastroenterology

## 2020-04-12 DIAGNOSIS — K2970 Gastritis, unspecified, without bleeding: Secondary | ICD-10-CM

## 2020-04-12 DIAGNOSIS — R1310 Dysphagia, unspecified: Secondary | ICD-10-CM

## 2020-04-12 MED ORDER — PANTOPRAZOLE SODIUM 40 MG PO TBEC: 40.0000 mg | DELAYED_RELEASE_TABLET | Freq: Two times a day (BID) | ORAL | 0 refills | Status: DC

## 2020-04-18 DIAGNOSIS — F411 Generalized anxiety disorder: Secondary | ICD-10-CM | POA: Diagnosis not present

## 2020-04-18 DIAGNOSIS — F4310 Post-traumatic stress disorder, unspecified: Secondary | ICD-10-CM | POA: Diagnosis not present

## 2020-04-18 DIAGNOSIS — F410 Panic disorder [episodic paroxysmal anxiety] without agoraphobia: Secondary | ICD-10-CM | POA: Diagnosis not present

## 2020-04-18 DIAGNOSIS — F4010 Social phobia, unspecified: Secondary | ICD-10-CM | POA: Diagnosis not present

## 2020-04-18 DIAGNOSIS — F339 Major depressive disorder, recurrent, unspecified: Secondary | ICD-10-CM | POA: Diagnosis not present

## 2020-05-03 ENCOUNTER — Encounter: Payer: Self-pay | Admitting: Family Medicine

## 2020-05-06 ENCOUNTER — Ambulatory Visit: Payer: 59 | Admitting: Family Medicine

## 2020-05-31 ENCOUNTER — Telehealth: Payer: Self-pay | Admitting: Gastroenterology

## 2020-05-31 ENCOUNTER — Other Ambulatory Visit: Payer: Self-pay | Admitting: Gastroenterology

## 2020-05-31 DIAGNOSIS — R1310 Dysphagia, unspecified: Secondary | ICD-10-CM

## 2020-05-31 DIAGNOSIS — K2990 Gastroduodenitis, unspecified, without bleeding: Secondary | ICD-10-CM

## 2020-05-31 DIAGNOSIS — K2970 Gastritis, unspecified, without bleeding: Secondary | ICD-10-CM

## 2020-05-31 MED ORDER — PANTOPRAZOLE SODIUM 40 MG PO TBEC: 40.0000 mg | DELAYED_RELEASE_TABLET | Freq: Two times a day (BID) | ORAL | 0 refills | Status: DC

## 2020-05-31 NOTE — Telephone Encounter (Signed)
Spoke to patient to schedule banding #3

## 2020-06-02 ENCOUNTER — Other Ambulatory Visit: Payer: Self-pay | Admitting: Family Medicine

## 2020-06-02 ENCOUNTER — Other Ambulatory Visit: Payer: Self-pay | Admitting: Urology

## 2020-06-21 ENCOUNTER — Encounter: Payer: Self-pay | Admitting: Family Medicine

## 2020-06-25 ENCOUNTER — Other Ambulatory Visit: Payer: Self-pay | Admitting: Family Medicine

## 2020-06-25 DIAGNOSIS — I10 Essential (primary) hypertension: Secondary | ICD-10-CM

## 2020-06-28 ENCOUNTER — Ambulatory Visit: Payer: 59 | Admitting: Family Medicine

## 2020-06-28 ENCOUNTER — Encounter: Payer: Self-pay | Admitting: Family Medicine

## 2020-06-28 ENCOUNTER — Other Ambulatory Visit: Payer: Self-pay

## 2020-06-28 VITALS — BP 146/85 | HR 79 | Temp 98.8°F | Ht 72.0 in | Wt 275.8 lb

## 2020-06-28 DIAGNOSIS — E119 Type 2 diabetes mellitus without complications: Secondary | ICD-10-CM

## 2020-06-28 LAB — POCT GLYCOSYLATED HEMOGLOBIN (HGB A1C): Hemoglobin A1C: 6.2 % — AB (ref 4.0–5.6)

## 2020-06-28 NOTE — Assessment & Plan Note (Signed)
Most recent A1c of  Lab Results  Component Value Date   HGBA1C 6.2 (A) 06/28/2020   indicates diabetes is well controlled.  He will continue current dose of Metformin.  Counseled on healthy, low carb diet and recommend frequent activity to help with maintaining good control of blood sugars.

## 2020-06-28 NOTE — Progress Notes (Signed)
NALU TROUBLEFIELD - 44 y.o. male MRN [id number]  Date of birth: 12/25/1975  Subjective Chief Complaint  Patient presents with  . Follow-up    HPI Tyler Gross is a 44 y.o. male here today for follow up of diabetes and discuss weight loss surgery.  He recently attended a seminar with [facility] regarding weight loss surgery and is interested in pursuing this.  He needs a letter from PCP to support this.  He has had difficult controlling his weight for several years.  He has tried several different diet programs including weight watchers and low carb/atkins type diet.  He has tried qsymia without success.  He did have some initial improvement with reduction of EtOH but has put weight back on.  He has tried vigorous exercise without lasting results.    ROS:  A comprehensive ROS was completed and negative except as noted per HPI  Allergies  Allergen Reactions  . Atorvastatin Other (See Comments)    Muscle fatigue  . Bee Venom Swelling  . Sulfamethoxazole-Trimethoprim Hives    Bactrim     Past Medical History:  Diagnosis Date  . Acne    on back taking doxycycline for  . Alcoholism ([place])    sober since 04/2018  . Anal lesion    nonhealing  . Anxiety   . Depression   . DM type 2 (diabetes mellitus, type 2) ([place])   . ED (erectile dysfunction)   . GAD (generalized anxiety disorder)   . GERD (gastroesophageal reflux disease)   . Gynecomastia, male    followed by dr Shellia Cleverly  . High serum estradiol    resolved  . History of anal fissures   . History of kidney stones   . History of small bowel obstruction 2004  . Hypertension   . IBS (irritable bowel syndrome)   . Mixed hyperlipidemia   . OSA on CPAP    per last study 07-31-2013  severe osa  . Primary hypogonadism in male    endocrinologist-  dr Shellia Cleverly  . Sleep apnea   . Wears glasses     Past Surgical History:  Procedure Laterality Date  . CARDIOVASCULAR STRESS TEST  10/05/2009   normal nuclear study w/ no ischemia/  normal  LV function and wall motion , ef 71%  . COLONOSCOPY  last one   2004, april 2021  . EVALUATION UNDER ANESTHESIA WITH ANAL FISTULECTOMY N/A 02/13/2018   Procedure: ANAL EXAM UNDER ANESTHESIA WITH BIOPSY;  Surgeon: [name], MD;  Location: [facility];  Service: General;  Laterality: N/A;  . ORCHIECTOMY Left 1991   w/ placement prosthesis (for torsion)  . TESTICULAR EXPLORATION Left 01/29/2020   Procedure: EXCHANGE OF LEFT TESTICULAR PROSTHESIS;  Surgeon: Cleon Gustin, MD;  Location: [facility];  Service: Urology;  Laterality: Left;  . URETEROLITHOTOMY  1999    Social History   Socioeconomic History  . Marital status: Single    Spouse name: Not on file  . Number of children: Not on file  . Years of education: Not on file  . Highest education level: Not on file  Occupational History  . Occupation: Therapist, sports   Tobacco Use  . Smoking status: Former Smoker    Packs/day: 1.00    Years: 10.00    Pack years: 10.00    Types: Cigarettes    Quit date: 02/07/2009    Years since quitting: 11.3  . Smokeless tobacco: Never Used  Vaping Use  . Vaping Use: Never  used  Substance and Sexual Activity  . Alcohol use: Not Currently    Comment: sober 04/09/2018  . Drug use: No  . Sexual activity: Yes    Partners: Male  Other Topics Concern  . Not on file  Social History Narrative   Lives with partner   Regular exercise: no   Caffeine use: 1 large cup of coffee daily; 2 to 3 sodas in the evening   Social Determinants of Health   Financial Resource Strain:   . Difficulty of Paying Living Expenses: Not on file  Food Insecurity:   . Worried About Charity fundraiser in the Last Year: Not on file  . Ran Out of Food in the Last Year: Not on file  Transportation Needs:   . Lack of Transportation (Medical): Not on file  . Lack of Transportation (Non-Medical): Not on file  Physical Activity:   . Days of Exercise per Week: Not on file  . Minutes of Exercise per  Session: Not on file  Stress:   . Feeling of Stress : Not on file  Social Connections:   . Frequency of Communication with Friends and Family: Not on file  . Frequency of Social Gatherings with Friends and Family: Not on file  . Attends Religious Services: Not on file  . Active Member of Clubs or Organizations: Not on file  . Attends Archivist Meetings: Not on file  . Marital Status: Not on file    Family History  Problem Relation Age of Onset  . Cancer Mother        breast cancer: stage I  . Hypertension Mother   . Irritable bowel syndrome Mother   . Heart disease Maternal Grandfather   . Diabetes Paternal Grandmother   . Colon cancer Maternal Aunt        great aunt  . Esophageal cancer Neg Hx   . Rectal cancer Neg Hx   . Stomach cancer Neg Hx     Health Maintenance  Topic Date Due  . Hepatitis C Screening  Never done  . FOOT EXAM  Never done  . OPHTHALMOLOGY EXAM  Never done  . INFLUENZA VACCINE  03/06/2020  . HEMOGLOBIN A1C  12/26/2020  . TETANUS/TDAP  01/27/2024  . PNEUMOCOCCAL POLYSACCHARIDE VACCINE AGE 74-64 HIGH RISK  Completed  . COVID-19 Vaccine  Completed  . HIV Screening  Completed     ----------------------------------------------------------------------------------------------------------------------------------------------------------------------------------------------------------------- Physical Exam BP (!) 146/85 (BP Location: Left Arm, Patient Position: Sitting, Cuff Size: Large)   Pulse 79   Temp 98.8 F (37.1 C) (Oral)   Ht 6' (1.829 m)   Wt 275 lb 12.8 oz (125.1 kg)   SpO2 99%   BMI 37.41 kg/m   Physical Exam Constitutional:      Appearance: Normal appearance.  HENT:     Head: Normocephalic and atraumatic.  Cardiovascular:     Rate and Rhythm: Normal rate and regular rhythm.  Pulmonary:     Effort: Pulmonary effort is normal.     Breath sounds: Normal breath sounds.  Neurological:     General: No focal deficit present.      Mental Status: He is alert.  Psychiatric:        Mood and Affect: Mood normal.        Behavior: Behavior normal.     ------------------------------------------------------------------------------------------------------------------------------------------------------------------------------------------------------------------- Assessment and Plan  Type 2 diabetes mellitus without complication, without long-term current use of insulin (HCC) Most recent A1c of  Lab Results  Component Value Date  HGBA1C 6.2 (A) 06/28/2020   indicates diabetes is well controlled.  He will continue current dose of Metformin.  Counseled on healthy, low carb diet and recommend frequent activity to help with maintaining good control of blood sugars.    Morbid obesity ([place]) I think he would be a good candidate for weight loss surgery as he has multiple co-morbidities including T2DM, HTN and OSA.  Encouraged to continue to work on low calorie diet and increased exercise as well.    No orders of the defined types were placed in this encounter.   Return in about 3 months (around 09/28/2020) for HTN/DM.    This visit occurred during the SARS-CoV-2 public health emergency.  Safety protocols were in place, including screening questions prior to the visit, additional usage of staff PPE, and extensive cleaning of exam room while observing appropriate contact time as indicated for disinfecting solutions.

## 2020-06-28 NOTE — Assessment & Plan Note (Signed)
I think he would be a good candidate for weight loss surgery as he has multiple co-morbidities including T2DM, HTN and OSA.  Encouraged to continue to work on low calorie diet and increased exercise as well.

## 2020-06-28 NOTE — Patient Instructions (Signed)
Great to see you today! I will get letter completed and let you know when finished.  Let's plan to follow up again in in 3 months or sooner if needed.

## 2020-07-03 ENCOUNTER — Encounter: Payer: Self-pay | Admitting: Family Medicine

## 2020-07-08 ENCOUNTER — Encounter: Payer: Self-pay | Admitting: Gastroenterology

## 2020-07-08 ENCOUNTER — Ambulatory Visit (INDEPENDENT_AMBULATORY_CARE_PROVIDER_SITE_OTHER): Payer: 59 | Admitting: Gastroenterology

## 2020-07-08 VITALS — BP 130/80 | HR 94 | Ht 72.0 in | Wt 276.1 lb

## 2020-07-08 DIAGNOSIS — K219 Gastro-esophageal reflux disease without esophagitis: Secondary | ICD-10-CM | POA: Diagnosis not present

## 2020-07-08 DIAGNOSIS — K2990 Gastroduodenitis, unspecified, without bleeding: Secondary | ICD-10-CM

## 2020-07-08 DIAGNOSIS — R1310 Dysphagia, unspecified: Secondary | ICD-10-CM

## 2020-07-08 DIAGNOSIS — K2970 Gastritis, unspecified, without bleeding: Secondary | ICD-10-CM

## 2020-07-08 DIAGNOSIS — K649 Unspecified hemorrhoids: Secondary | ICD-10-CM

## 2020-07-08 DIAGNOSIS — K200 Eosinophilic esophagitis: Secondary | ICD-10-CM | POA: Diagnosis not present

## 2020-07-08 MED ORDER — PANTOPRAZOLE SODIUM 40 MG PO TBEC: 40.0000 mg | DELAYED_RELEASE_TABLET | Freq: Two times a day (BID) | ORAL | 5 refills | Status: DC

## 2020-07-08 NOTE — Patient Instructions (Signed)
If you are age 45 or older, your body mass index should be between 23-30. Your Body mass index is 37.45 kg/m. If this is out of the aforementioned range listed, please consider follow up with your Primary Care Provider.  If you are age 61 or younger, your body mass index should be between 19-25. Your Body mass index is 37.45 kg/m. If this is out of the aformentioned range listed, please consider follow up with your Primary Care Provider.   We have sent the following medications to your pharmacy for you to pick up at your convenience:] Protonix 40 mg    [place]   1. The procedure you have had should have been relatively painless since the banding of the area involved does not have nerve endings and there is no pain sensation.  The rubber band cuts off the blood supply to the hemorrhoid and the band may fall off as soon as 48 hours after the banding (the band may occasionally be seen in the toilet bowl following a bowel movement). You may notice a temporary feeling of fullness in the rectum which should respond adequately to plain Tylenol or Motrin.  2. Following the banding, avoid strenuous exercise that evening and resume full activity the next day.  A sitz bath (soaking in a warm tub) or bidet is soothing, and can be useful for cleansing the area after bowel movements.     3. To avoid constipation, take two tablespoons of natural wheat bran, natural oat bran, flax, Benefiber or any over the counter fiber supplement and increase your water intake to 7-8 glasses daily.    4. Unless you have been prescribed anorectal medication, do not put anything inside your rectum for two weeks: No suppositories, enemas, fingers, etc.  5. Occasionally, you may have more bleeding than usual after the banding procedure.  This is often from the untreated hemorrhoids rather than the treated one.  Don't be concerned if there is a tablespoon or so of blood.  If there is  more blood than this, lie flat with your bottom higher than your head and apply an ice pack to the area. If the bleeding does not stop within a half an hour or if you feel faint, call our office at [phone] or go to the emergency room.  6. Problems are not common; however, if there is a substantial amount of bleeding, severe pain, chills, fever or difficulty passing urine (very rare) or other problems, you should call us at [phone] or report to the nearest emergency room.  7. Do not stay seated continuously for more than 2-3 hours for a day or two after the procedure.  Tighten your buttock muscles 10-15 times every two hours and take 10-15 deep breaths every 1-2 hours.  Do not spend more than a few minutes on the toilet if you cannot empty your bowel; instead re-visit the toilet at a later time.    Please contact the office in 3 months for a follow up. We are unable to schedule a February 2022 appointment at this time.  Thank you for choosing me and [facility].  Vito Cirigliano, D.O.

## 2020-07-08 NOTE — Progress Notes (Signed)
P  Chief Complaint:    Symptomatic Internal Hemorrhoids; Hemorrhoid Band Ligation  GI History: 44y.o.malewith a history of GAD, diabetes, IBS, OSA (on CPAP), hypertension,initially seen in the GI clinic on 09/14/2019 for evaluation of anal fissure, hematochezia, and change in bowel habits/constipation, along with chronic reflux and intermittent pill dysphagia.Anal fissure resolved with nifedipine with lidocaine, but due to ongoing hematochezia, evaluated colonoscopy, which was notable for internal hemorrhoids as below.  Reflux well controlled on pantoprazole BID. Actually reflux sxs stopped afterquitting EtOH 04/2018. Pill dysphagia improved since EGD with dil and starting Rx.EGD with esophageal eosinophilia as below, favoring EoE. Planning repeat EGD to eval for histologic/endoscopic response.  Interestingly, twin brother and mother withEoE.  -Colonoscopy (10/08/2019, Dr. Bryan Lemma): Sigmoid diverticulosis, internal hemorrhoids, otherwise normal with normal TI. Repeat 10 years -EGD (10/08/2019, Dr. Bryan Lemma): Longitudinal furrows, felinization (biopsy with 20eoslower/15 upper per hpf. Favor EoEbased on endoscopic appearance), empiric 54FrMaloney dilation, LA Grade B esophagitis, non-H. pylori gastritis. Repeat EGD in 8 weeks  -12/07/2019: Successful banding of the LL hemorrhoid -02/23/2020: Successful banding of the RA hemorrhoid -07/08/2020: Presents for hemorrhoid banding #3  Previous GI work-up as follows: In 2003/2004 developed abdominal pain and was admitted with SBO, treated with rest only (no NGT or surgery). Colonoscopy at that time in [place], [place] with suspicion for Crohn's Disease. Treated with Remicade (only 3 infusions) and steroids. Symptoms improved, and followed up with a different GI, and was felt this was not Crohn's Disease and was diagnosed with Microscopic Colitis on a second colonoscopy. Symptoms eventually resolved.   Diagnosed with anal  fissure and 2019. Referred to Dr. [name] at [facility], with EUAin 7/2019n/fanal fissure. 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 Was treated with topical nifedipine with lidocaine with clinical improvement.  HPI:     Patient is a 44 y.o. malewith a history of symptomatic internal hemorrhoids presenting to the Gastroenterology Clinic for follow-up and ongoing treatment. The patient presents with symptomatic grade 2 hemorrhoids, unresponsive to maximal medical therapy, requesting rubber band ligation of symptomatic hemorrhoidal disease.  No change in medical or surgical history, medications, allergies, social history since last appointment with me.   Review of systems:     No chest pain, no SOB, no fevers, no urinary sx   Past Medical History:  Diagnosis Date  . Acne    on back taking doxycycline for  . Alcoholism ([place])    sober since 04/2018  . Anal lesion    nonhealing  . Anxiety   . Depression   . DM type 2 (diabetes mellitus, type 2) ([place])   . ED (erectile dysfunction)   . GAD (generalized anxiety disorder)   . GERD (gastroesophageal reflux disease)   . Gynecomastia, male    followed by dr Shellia Cleverly  . High serum estradiol    resolved  . History of anal fissures   . History of kidney stones   . History of small bowel obstruction 2004  . Hypertension   . IBS (irritable bowel syndrome)   . Mixed hyperlipidemia   . OSA on CPAP    per last study 07-31-2013  severe osa  . Primary hypogonadism in male    endocrinologist-  dr Shellia Cleverly  . Sleep apnea   . Wears glasses     Patient's surgical history, family medical history, social history, medications and allergies were all reviewed in Epic    Current Outpatient Medications  Medication Sig Dispense  Refill  . amLODipine (NORVASC) 5 MG tablet TAKE ONE TABLET BY MOUTH EVERY MORNING 90 tablet 1  . anastrozole (ARIMIDEX) 1 MG tablet TAKE  ONE TABLET BY MOUTH DAILY 90 tablet 3  . atenolol (TENORMIN) 100 MG tablet TAKE ONE TABLET BY MOUTH EVERY MORNING 90 tablet 1  . Calcium Carbonate-Vitamin D (CALCIUM-VITAMIN D) 600-125 MG-UNIT TABS 1 tablet daily.    . clomiPHENE (CLOMID) 50 MG tablet Take 0.5 tablets (25 mg total) by mouth daily. (Patient taking differently: Take 25 mg by mouth every Monday, Wednesday, and Friday. ) 15 tablet 11  . DULoxetine (CYMBALTA) 60 MG capsule Take 1 capsule (60 mg total) by mouth daily. For mood control 30 capsule 0  . gabapentin (NEURONTIN) 600 MG tablet Take 600 mg by mouth 2 (two) times daily.     Marland Kitchen gemfibrozil (LOPID) 600 MG tablet TAKE ONE TABLET BY MOUTH TWICE A DAY BEFORE A MEAL 180 tablet 2  . lisinopril-hydrochlorothiazide (ZESTORETIC) 20-25 MG tablet TAKE ONE TABLET BY MOUTH DAILY 90 tablet 2  . metFORMIN (GLUCOPHAGE) 500 MG tablet TAKE ONE TABLET BY MOUTH TWICE A DAY WITH MEALS 180 tablet 1  . Multiple Vitamin (MULTIVITAMIN WITH MINERALS) TABS tablet Take 1 tablet by mouth daily.    . Omega-3 Fatty Acids (FISH OIL) 1000 MG CPDR Take by mouth.    Marland Kitchen OVER THE COUNTER MEDICATION Cranberry 1 daily    . OVER THE COUNTER MEDICATION Vitamin b 12 daily    . pantoprazole (PROTONIX) 40 MG tablet Take 1 tablet (40 mg total) by mouth 2 (two) times daily. 90 tablet 0  . potassium chloride (KLOR-CON) 10 MEQ tablet TAKE ONE TABLET BY MOUTH DAILY 90 tablet 1  . Probiotic Product (PROBIOTIC PO) Take 1 tablet by mouth daily.    . rosuvastatin (CRESTOR) 10 MG tablet TAKE ONE TABLET BY MOUTH AT BEDTIME 90 tablet 1  . UNABLE TO FIND Vitamin d daily     No current facility-administered medications for this visit.    Physical Exam:     There were no vitals taken for this visit.  GENERAL:  Pleasant male in NAD PSYCH: : Cooperative, normal affect NEURO: Alert and oriented x 3, no focal neurologic deficits Rectal exam: Sensation intact and preserved anal wink.  Grade 2 hemorrhoids noted in RP position.  No  external anal fissures noted. Normal sphincter tone. No palpable mass. No blood on the exam glove. (Chaperone: Lanny Hurst, CMA).   IMPRESSION and PLAN:    #1.  Symptomatic internal hemorrhoids: PROCEDURE NOTE: The patient presents with symptomatic grade 2 hemorrhoids, unresponsive to maximal medical therapy, requesting rubber band ligation of symptomatic hemorrhoidal disease.  All risks, benefits and alternative forms of therapy were described and informed consent was obtained.  In the Left Lateral Decubitus position, anoscopic examination revealed grade 2 hemorrhoids in the RP position(s).  The anorectum was pre-medicated with RectiCare. The decision was made to band the RP internal hemorrhoid, and the Parker was used to perform band ligation without complication.  Digital anorectal examination was then performed to assure proper positioning of the band, and to adjust the banded tissue as required.  The patient was discharged home without pain or other issues.  Dietary and behavioral recommendations were given and along with follow-up instructions.     The following adjunctive treatments were recommended:  -Resume high-fiber diet with fiber supplement (i.e. Citrucel or Benefiber) with goal for soft stools without straining to have a BM. -Resume adequate fluid intake.  The patient will return as needed if recurrence of hemorrhoidal symptoms for possible additional banding as required. No complications were encountered and the patient tolerated the procedure well.  2)Esophageal eosinophilia 3) GERD  -Will need repeat EGD with biopsies.  Plan to arrange for follow-up after completion hemorrhoid banding series.  Otherwise doing well clinically - Resume PPI - Protoinix refilled. Will try to go to daily dosing, and if breakthrough reflux symptoms, resume BID PPI   4) Constipation - Constipation improved -Continue current therapy along with high-fiber diet/fiber  supplementation.  RTC in 3 months or sooner as needed  I spent an additional 20 minutes of nonprocedural time, including independent review of results as outlined above, communicating results with the patient directly, face-to-face time with the patient, coordinating care, ordering studies and medications as appropriate, and documentation.         [place] ,DO, FACG 07/08/2020, 2:09 PM

## 2020-07-11 ENCOUNTER — Encounter: Payer: Self-pay | Admitting: Family Medicine

## 2020-07-11 NOTE — Telephone Encounter (Signed)
Panya do you know anything about this?

## 2020-07-12 ENCOUNTER — Encounter (HOSPITAL_BASED_OUTPATIENT_CLINIC_OR_DEPARTMENT_OTHER): Payer: Self-pay

## 2020-07-12 ENCOUNTER — Emergency Department (HOSPITAL_BASED_OUTPATIENT_CLINIC_OR_DEPARTMENT_OTHER): Payer: 59

## 2020-07-12 ENCOUNTER — Emergency Department (HOSPITAL_BASED_OUTPATIENT_CLINIC_OR_DEPARTMENT_OTHER)
Admission: EM | Admit: 2020-07-12 | Discharge: 2020-07-12 | Disposition: A | Discharge status: Home / Self Care | Payer: 59 | Attending: Emergency Medicine | Admitting: Emergency Medicine

## 2020-07-12 ENCOUNTER — Other Ambulatory Visit: Payer: Self-pay

## 2020-07-12 DIAGNOSIS — I10 Essential (primary) hypertension: Secondary | ICD-10-CM | POA: Insufficient documentation

## 2020-07-12 DIAGNOSIS — Z79899 Other long term (current) drug therapy: Secondary | ICD-10-CM | POA: Diagnosis not present

## 2020-07-12 DIAGNOSIS — Z20822 Contact with and (suspected) exposure to covid-19: Secondary | ICD-10-CM | POA: Insufficient documentation

## 2020-07-12 DIAGNOSIS — E782 Mixed hyperlipidemia: Secondary | ICD-10-CM | POA: Diagnosis not present

## 2020-07-12 DIAGNOSIS — Z87891 Personal history of nicotine dependence: Secondary | ICD-10-CM | POA: Insufficient documentation

## 2020-07-12 DIAGNOSIS — Z7984 Long term (current) use of oral hypoglycemic drugs: Secondary | ICD-10-CM | POA: Diagnosis not present

## 2020-07-12 DIAGNOSIS — R079 Chest pain, unspecified: Secondary | ICD-10-CM | POA: Diagnosis not present

## 2020-07-12 DIAGNOSIS — R0789 Other chest pain: Secondary | ICD-10-CM | POA: Diagnosis not present

## 2020-07-12 DIAGNOSIS — E1169 Type 2 diabetes mellitus with other specified complication: Secondary | ICD-10-CM | POA: Diagnosis not present

## 2020-07-12 LAB — RESP PANEL BY RT-PCR (FLU A&B, COVID) ARPGX2
Influenza A by PCR: NEGATIVE
Influenza B by PCR: NEGATIVE
SARS Coronavirus 2 by RT PCR: NEGATIVE

## 2020-07-12 LAB — BASIC METABOLIC PANEL WITH GFR
Anion gap: 11 (ref 5–15)
BUN: 13 mg/dL (ref 6–20)
CO2: 24 mmol/L (ref 22–32)
Calcium: 9.2 mg/dL (ref 8.9–10.3)
Chloride: 105 mmol/L (ref 98–111)
Creatinine, Ser: 1.12 mg/dL (ref 0.61–1.24)
GFR, Estimated: >60 mL/min
Glucose, Bld: 121 mg/dL — ABNORMAL HIGH (ref 70–99)
Potassium: 4.3 mmol/L (ref 3.5–5.1)
Sodium: 140 mmol/L (ref 135–145)

## 2020-07-12 LAB — CBC WITH DIFFERENTIAL/PLATELET
Abs Immature Granulocytes: 0.08 10*3/uL — ABNORMAL HIGH (ref 0.00–0.07)
Basophils Absolute: 0.1 10*3/uL (ref 0.0–0.1)
Basophils Relative: 1 %
Eosinophils Absolute: 0.3 10*3/uL (ref 0.0–0.5)
Eosinophils Relative: 3 %
HCT: 45 % (ref 39.0–52.0)
Hemoglobin: 14.8 g/dL (ref 13.0–17.0)
Immature Granulocytes: 1 %
Lymphocytes Relative: 27 %
Lymphs Abs: 2.2 10*3/uL (ref 0.7–4.0)
MCH: 28.4 pg (ref 26.0–34.0)
MCHC: 32.9 g/dL (ref 30.0–36.0)
MCV: 86.2 fL (ref 80.0–100.0)
Monocytes Absolute: 0.6 10*3/uL (ref 0.1–1.0)
Monocytes Relative: 7 %
Neutro Abs: 5 10*3/uL (ref 1.7–7.7)
Neutrophils Relative %: 61 %
Platelets: 262 10*3/uL (ref 150–400)
RBC: 5.22 MIL/uL (ref 4.22–5.81)
RDW: 14.1 % (ref 11.5–15.5)
WBC: 8.2 10*3/uL (ref 4.0–10.5)
nRBC: 0 % (ref 0.0–0.2)

## 2020-07-12 LAB — TROPONIN I (HIGH SENSITIVITY)
Troponin I (High Sensitivity): 4 ng/L (ref <18)
Troponin I (High Sensitivity): 4 ng/L (ref <18)

## 2020-07-12 NOTE — Discharge Instructions (Signed)
Work-up here in the emergency room was reassuring, however, anytime someone has a complaint of chest pain we recommend follow-up with primary care provider and cardiology. Call the number provided to set up an appointment with cardiology.  Return to the emergency department for worsening chest pain, shortness of breath, dizziness, passing out, abdominal or back pain with the chest pain, or any other major concerns.

## 2020-07-12 NOTE — ED Provider Notes (Signed)
[facility] EMERGENCY DEPARTMENT Provider Note   CSN: [id number] Arrival date & time: 07/12/20  0940     History No chief complaint on file.   Tyler Gross is a 44 y.o. male.  HPI      Tyler Gross is a 44 y.o. male, with a history of anxiety, depression, DM type II, GERD, HTN, presenting to the ED with chest pain for the past 3 days.  Initially during triage he reportedly describes pain as sharp, however, during my interview he described the pain as aching and pressure, retrosternal, 3/10, nonradiating. He states nothing makes the pain better or worse. It definitely does not worsen with exertion or deep breathing. He has had incidence of similar chest discomfort in years past and reportedly had normal stress test and echo.  Denies fever/chills, cough, shortness of breath, abdominal pain, back pain, numbness, weakness, dizziness, syncope, lower extremity edema/pain, N/V/D, or any other complaints.  Past Medical History:  Diagnosis Date  . Acne    on back taking doxycycline for  . Alcoholism ([place])    sober since 04/2018  . Anal lesion    nonhealing  . Anxiety   . Depression   . DM type 2 (diabetes mellitus, type 2) ([place])   . ED (erectile dysfunction)   . GAD (generalized anxiety disorder)   . GERD (gastroesophageal reflux disease)   . Gynecomastia, male    followed by dr Shellia Cleverly  . High serum estradiol    resolved  . History of anal fissures   . History of kidney stones   . History of small bowel obstruction 2004  . Hypertension   . IBS (irritable bowel syndrome)   . Mixed hyperlipidemia   . OSA on CPAP    per last study 07-31-2013  severe osa  . Primary hypogonadism in male    endocrinologist-  dr Shellia Cleverly  . Sleep apnea   . Wears glasses     Patient Active Problem List   Diagnosis Date Noted  . Acne 08/19/2019  . Cervical radiculopathy 05/12/2019  . Acute pain of left shoulder 04/29/2019  . Muscle spasm 04/05/2019  . Venous  insufficiency 02/17/2019  . Diarrhea 02/17/2019  . Anal fissure 10/28/2018  . Type 2 diabetes mellitus without complication, without long-term current use of insulin ([place]) 06/26/2018  . Alcohol dependence in remission ([place])   . Severe recurrent major depression without psychotic features ([place]) 04/09/2018  . Depressive disorder 01/24/2018  . Vitamin B deficiency 01/24/2018  . GAD (generalized anxiety disorder) 12/20/2017  . Morbid obesity ([place]) 01/09/2017  . Elevated liver enzymes 11/08/2016  . Gynecomastia 01/06/2014  . ED (erectile dysfunction) 01/06/2014  . Estrogen excess 01/06/2014  . Hypogonadism male 06/25/2013  . OSA (obstructive sleep apnea) 05/19/2013  . Hypertriglyceridemia 04/14/2010  . Essential hypertension 04/14/2010  . PALPITATIONS 04/14/2010  . CHEST PAIN-UNSPECIFIED 04/14/2010    Past Surgical History:  Procedure Laterality Date  . CARDIOVASCULAR STRESS TEST  10/05/2009   normal nuclear study w/ no ischemia/  normal LV function and wall motion , ef 71%  . COLONOSCOPY  last one   2004, april 2021  . EVALUATION UNDER ANESTHESIA WITH ANAL FISTULECTOMY N/A 02/13/2018   Procedure: ANAL EXAM UNDER ANESTHESIA WITH BIOPSY;  Surgeon: [name], MD;  Location: [facility];  Service: General;  Laterality: N/A;  . ORCHIECTOMY Left 1991   w/ placement prosthesis (for torsion)  . TESTICULAR EXPLORATION Left 01/29/2020   Procedure: EXCHANGE OF LEFT TESTICULAR PROSTHESIS;  Surgeon: Cleon Gustin, MD;  Location: [facility];  Service: Urology;  Laterality: Left;  . URETEROLITHOTOMY  1999       Family History  Problem Relation Age of Onset  . Cancer Mother        breast cancer: stage I  . Hypertension Mother   . Irritable bowel syndrome Mother   . Heart disease Maternal Grandfather   . Diabetes Paternal Grandmother   . Colon cancer Maternal Aunt        great aunt  . Esophageal cancer Neg Hx   . Rectal cancer Neg Hx   . Stomach  cancer Neg Hx     Social History   Tobacco Use  . Smoking status: Former Smoker    Packs/day: 1.00    Years: 10.00    Pack years: 10.00    Types: Cigarettes    Quit date: 02/07/2009    Years since quitting: 11.4  . Smokeless tobacco: Never Used  Vaping Use  . Vaping Use: Never used  Substance Use Topics  . Alcohol use: Not Currently    Comment: sober 04/09/2018  . Drug use: No    Home Medications Prior to Admission medications   Medication Sig Start Date End Date Taking? Authorizing Provider  amLODipine (NORVASC) 5 MG tablet TAKE ONE TABLET BY MOUTH EVERY MORNING 06/26/20   Luetta Nutting, DO  anastrozole (ARIMIDEX) 1 MG tablet TAKE ONE TABLET BY MOUTH DAILY 06/02/20   Alyson Ingles Candee Furbish, MD  atenolol (TENORMIN) 100 MG tablet TAKE ONE TABLET BY MOUTH EVERY MORNING 06/02/20   Luetta Nutting, DO  Calcium Carbonate-Vitamin D (CALCIUM-VITAMIN D) 600-125 MG-UNIT TABS 1 tablet daily. 02/03/10   [provider]  clomiPHENE (CLOMID) 50 MG tablet Take 0.5 tablets (25 mg total) by mouth daily. Patient taking differently: Take 25 mg by mouth every Monday, Wednesday, and Friday.  01/09/17   Philemon Kingdom, MD  DULoxetine (CYMBALTA) 60 MG capsule Take 1 capsule (60 mg total) by mouth daily. For mood control 04/13/18   Money, Lowry Ram, FNP  gabapentin (NEURONTIN) 600 MG tablet Take 600 mg by mouth 2 (two) times daily.     [provider]  gemfibrozil (LOPID) 600 MG tablet TAKE ONE TABLET BY MOUTH TWICE A DAY BEFORE A MEAL 02/17/20   Luetta Nutting, DO  lisinopril-hydrochlorothiazide (ZESTORETIC) 20-25 MG tablet TAKE ONE TABLET BY MOUTH DAILY 06/02/20   Luetta Nutting, DO  metFORMIN (GLUCOPHAGE) 500 MG tablet TAKE ONE TABLET BY MOUTH TWICE A DAY WITH MEALS 06/26/20   Luetta Nutting, DO  Multiple Vitamin (MULTIVITAMIN WITH MINERALS) TABS tablet Take 1 tablet by mouth daily.    [provider]  Omega-3 Fatty Acids (FISH OIL) 1000 MG CPDR Take by mouth.    [provider]  OVER THE COUNTER MEDICATION Cranberry 1 daily    [provider]  OVER THE COUNTER MEDICATION Vitamin b 12 daily    [provider]  pantoprazole (PROTONIX) 40 MG tablet Take 1 tablet (40 mg total) by mouth 2 (two) times daily. 07/08/20   Cirigliano, Vito V, DO  potassium chloride (KLOR-CON) 10 MEQ tablet TAKE ONE TABLET BY MOUTH DAILY 06/26/20   Luetta Nutting, DO  rosuvastatin (CRESTOR) 10 MG tablet TAKE ONE TABLET BY MOUTH AT BEDTIME 06/26/20   Luetta Nutting, DO  UNABLE TO FIND Vitamin d daily    [provider]  amLODipine (NORVASC) 5 MG tablet TAKE ONE TABLET BY MOUTH EVERY MORNING 08/10/19   Luetta Nutting,  DO    Allergies    Atorvastatin, Bee venom, and Sulfamethoxazole-trimethoprim  Review of Systems   Review of Systems  Constitutional: Negative for chills, diaphoresis and fever.  Respiratory: Negative for cough and shortness of breath.   Cardiovascular: Positive for chest pain. Negative for leg swelling.  Gastrointestinal: Negative for abdominal pain, diarrhea, nausea and vomiting.  Musculoskeletal: Negative for back pain.  Neurological: Negative for dizziness, syncope, weakness and numbness.  All other systems reviewed and are negative.   Physical Exam Updated Vital Signs BP (!) 141/92 (BP Location: Right Arm)   Pulse 87   Temp 99.7 F (37.6 C) (Oral)   Resp 20   Ht 6' (1.829 m)   Wt 123.8 kg   SpO2 98%   BMI 37.03 kg/m   Physical Exam Vitals and nursing note reviewed.  Constitutional:      General: He is not in acute distress.    Appearance: He is well-developed. He is not diaphoretic.  HENT:     Head: Normocephalic and atraumatic.     Mouth/Throat:     Mouth: Mucous membranes are moist.     Pharynx: Oropharynx is clear.  Eyes:     Conjunctiva/sclera: Conjunctivae normal.  Cardiovascular:     Rate and Rhythm: Normal rate and regular rhythm.     Pulses: Normal pulses.          Radial pulses are 2+ on the right side  and 2+ on the left side.       Posterior tibial pulses are 2+ on the right side and 2+ on the left side.     Heart sounds: Normal heart sounds.     Comments: Tactile temperature in the extremities appropriate and equal bilaterally. Pulmonary:     Effort: Pulmonary effort is normal. No respiratory distress.     Breath sounds: Normal breath sounds.  Abdominal:     Palpations: Abdomen is soft.     Tenderness: There is no abdominal tenderness. There is no guarding.  Musculoskeletal:     Cervical back: Neck supple.     Right lower leg: No edema.     Left lower leg: No edema.  Lymphadenopathy:     Cervical: No cervical adenopathy.  Skin:    General: Skin is warm and dry.  Neurological:     Mental Status: He is alert.     Comments: No noted acute cognitive deficit. Sensation grossly intact to light touch in the extremities.   Grip strengths equal bilaterally.   Strength 5/5 in all extremities.  No gait disturbance.  Coordination intact.  Cranial nerves III-XII grossly intact.  Handles oral secretions without noted difficulty.  No noted phonation or speech deficit. No facial droop.   Psychiatric:        Mood and Affect: Mood and affect normal.        Speech: Speech normal.        Behavior: Behavior normal.     ED Results / Procedures / Treatments   Labs (all labs ordered are listed, but only abnormal results are displayed) Labs Reviewed  BASIC METABOLIC PANEL - Abnormal; Notable for the following components:      Result Value   Glucose, Bld 121 (*)    All other components within normal limits  CBC WITH DIFFERENTIAL/PLATELET - Abnormal; Notable for the following components:   Abs Immature Granulocytes 0.08 (*)    All other components within normal limits  RESP PANEL BY RT-PCR (FLU A&B, COVID) ARPGX2  TROPONIN I (  HIGH SENSITIVITY)  TROPONIN I (HIGH SENSITIVITY)    EKG EKG Interpretation  Date/Time:  Tuesday July 12 2020 09:44:08 EST Ventricular Rate:  89 PR  Interval:  208 QRS Duration: 82 QT Interval:  342 QTC Calculation: 416 R Axis:   80 Text Interpretation: Normal sinus rhythm Normal ECG since last tracing no significant change Confirmed by Malvin Johns [phone]) on 07/12/2020 9:52:08 AM Also confirmed by Malvin Johns [phone]), editor Hattie Perch ([id number])  on 07/12/2020 11:23:50 AM   Radiology DG Chest Port 1 View  Result Date: 07/12/2020 CLINICAL DATA:  Chest pain. EXAM: PORTABLE CHEST 1 VIEW COMPARISON:  04/11/2010. FINDINGS: The heart size and mediastinal contours are within normal limits. Both lungs are clear. No visible pleural effusions or pneumothorax. No acute osseous abnormality. IMPRESSION: No acute cardiopulmonary disease. Electronically Signed   By: Margaretha Sheffield MD   On: 07/12/2020 11:09    Procedures Procedures (including critical care time)  Medications Ordered in ED Medications - No data to display  ED Course  I have reviewed the triage vital signs and the nursing notes.  Pertinent labs & imaging results that were available during my care of the patient were reviewed by me and considered in my medical decision making (see chart for details).    MDM Rules/Calculators/A&P                          Patient presents with low level chest pain for several days. Patient is nontoxic appearing, afebrile, not tachycardic, not tachypneic, not hypotensive, maintains excellent SPO2 on room air, and is in no apparent distress.   I have reviewed the patient's chart to obtain more information.   Low suspicion for ACS. No high risk/suspicious features; no exertional chest pain, vomiting, diaphoresis, or radiation. HEART score is 3, indicating low risk for a cardiac event.  EKG without evidence of acute ischemia or pathologic/symptomatic arrhythmia.  Delta troponins negative. Wells criteria score is 0, indicating low risk for PE.  PERC negative. Dissection was considered, but thought less likely base on: History and  description of the pain are not suggestive, patient is not ill-appearing, lack of risk factors, equal bilateral pulses, lack of neurologic deficits, no widened mediastinum on chest x-ray.  I reviewed and interpreted the patient's labs and radiological studies. Patient to follow-up with cardiology.  The patient was given instructions for home care as well as return precautions. Patient voices understanding of these instructions, accepts the plan, and is comfortable with discharge.   Vitals:   07/12/20 1230 07/12/20 1300 07/12/20 1330 07/12/20 1422  BP: 124/80 112/75 116/72 118/70  Pulse: 73 75 80 77  Resp: 15 16 (!) 23 16  Temp:    98.2 F (36.8 C)  TempSrc:    Oral  SpO2: 97% 100% 96% 97%  Weight:      Height:         Final Clinical Impression(s) / ED Diagnoses Final diagnoses:  Chest discomfort    Rx / DC Orders ED Discharge Orders    None       Layla Maw 07/12/20 1732    Malvin Johns, MD 07/14/20 [phone]

## 2020-07-12 NOTE — ED Triage Notes (Signed)
Pt complaint of chest tightness since 07/09/2020.

## 2020-07-18 DIAGNOSIS — F4310 Post-traumatic stress disorder, unspecified: Secondary | ICD-10-CM | POA: Diagnosis not present

## 2020-07-18 DIAGNOSIS — F411 Generalized anxiety disorder: Secondary | ICD-10-CM | POA: Diagnosis not present

## 2020-07-18 DIAGNOSIS — F339 Major depressive disorder, recurrent, unspecified: Secondary | ICD-10-CM | POA: Diagnosis not present

## 2020-07-18 DIAGNOSIS — F410 Panic disorder [episodic paroxysmal anxiety] without agoraphobia: Secondary | ICD-10-CM | POA: Diagnosis not present

## 2020-07-18 DIAGNOSIS — F4010 Social phobia, unspecified: Secondary | ICD-10-CM | POA: Diagnosis not present

## 2020-07-24 ENCOUNTER — Encounter: Payer: Self-pay | Admitting: Family Medicine

## 2020-08-02 ENCOUNTER — Other Ambulatory Visit: Payer: Self-pay | Admitting: Surgery

## 2020-08-02 ENCOUNTER — Other Ambulatory Visit (HOSPITAL_COMMUNITY): Payer: Self-pay | Admitting: Surgery

## 2020-08-29 ENCOUNTER — Ambulatory Visit: Payer: 59 | Admitting: Internal Medicine

## 2020-09-05 ENCOUNTER — Encounter: Payer: 59 | Attending: Surgery | Admitting: Skilled Nursing Facility1

## 2020-09-05 ENCOUNTER — Other Ambulatory Visit: Payer: Self-pay | Admitting: Family Medicine

## 2020-09-05 ENCOUNTER — Encounter: Payer: Self-pay | Admitting: Skilled Nursing Facility1

## 2020-09-05 ENCOUNTER — Other Ambulatory Visit: Payer: Self-pay

## 2020-09-05 DIAGNOSIS — E119 Type 2 diabetes mellitus without complications: Secondary | ICD-10-CM | POA: Diagnosis not present

## 2020-09-05 NOTE — Progress Notes (Signed)
Nutrition Assessment for Bariatric Surgery Medical Nutrition Therapy Appt Start Time:  10.20 End Time: 11:25  Patient was seen on 09/05/2020 for Pre-Operative Nutrition Assessment. Letter of approval faxed to [facility] bariatric surgery program coordinator on 09/05/2020   Referral stated Supervised Weight Loss (SWL) visits needed: 0; pt to come back 3 months from now to show change in late night snacking pt understands this is not insurance mandated   Planned surgery: sleeve gastrectomy  Pt expectation of surgery: to lose weight  Pt expectation of dietitian: to help educate     NUTRITION ASSESSMENT   Anthropometrics  Start weight at [facility]: 277.5 lbs (date: 09/05/2020)  Height: 72 in BMI: 37.64 kg/m2     Clinical  Medical hx: GERD, anxiety, depression, diabetes, IBS-D, hypogonadism  Medications: gummy probiotics  Labs: A1C 6.2 Notable signs/symptoms: gynecomastia, IBS-D Any previous deficiencies? Vitamin D  Micronutrient Nutrition Focused Physical Exam: Hair: No issues observed Eyes: No issues observed Mouth: No issues observed Neck: No issues observed Nails: No issues observed Skin: No issues observed  Lifestyle & Dietary Hx  Pt states he does not check his blood sugars having it for about 1-2 years. Pt reports having used wine as a coping mechanism in the past stating he now can go a week or 2 and not have any.   Pt states his partner had the RYGB stating he has gained his weight back.   Pt states his partner and his partners mom cooks his meals.  Pt states he is not a huge fan of water.   24-Hr Dietary Recall First Meal: fast food Snack: chips Second Meal: [place] sandwich + olives + chips + 2 packs fruit gummies Snack:  Third Meal: pork chop, brocolli, sweet potato + roll Snack: popcorn + chips + cookies Beverages: diet soda, energy drink, diet gingerale    Estimated Energy Needs Calories: 1800  NUTRITION DIAGNOSIS  Overweight/obesity ([place]-3.3)  related to past poor dietary habits and physical inactivity as evidenced by patient w/ planned sleeve gastrectomy surgery following dietary guidelines for continued weight loss.    NUTRITION INTERVENTION  Nutrition counseling (C-1) and education (E-2) to facilitate bariatric surgery goals.   Pre-Op Goals Reviewed with the Patient . Track food and beverage intake (pen and paper, MyFitness Pal, Baritastic app, etc.) . Make healthy food choices while monitoring portion sizes . Consume 3 meals per day or try to eat every 3-5 hours . Avoid concentrated sugars and fried foods . Keep sugar & fat in the single digits per serving on food labels . Practice CHEWING your food (aim for applesauce consistency) . Practice not drinking 15 minutes before, during, and 30 minutes after each meal and snack . Avoid all carbonated beverages (ex: soda, sparkling beverages)  . Limit caffeinated beverages (ex: coffee, tea, energy drinks) . Avoid all sugar-sweetened beverages (ex: regular soda, sports drinks)  . Avoid alcohol  . Aim for 64-100 ounces of FLUID daily (with at least half of fluid intake being plain water)  . Aim for at least 60-80 grams of PROTEIN daily . Look for a liquid protein source that contains ?15 g protein and ?5 g carbohydrate (ex: shakes, drinks, shots) . Make a list of non-food related activities . Physical activity is an important part of a healthy lifestyle so keep it moving! The goal is to reach 150 minutes of exercise per week, including cardiovascular and weight baring activity.  *Goals that are bolded indicate the pt would like to start working towards these  Handouts Provided Include  . Bariatric Surgery handouts (Nutrition Visits, Pre-Op Goals, Protein Shakes, Vitamins & Minerals)  Learning Style & Readiness for Change Teaching method utilized: Visual & Auditory  Demonstrated degree of understanding via: Teach Back  Readiness Level: contemplative Barriers to  learning/adherence to lifestyle change: some non hunger eating  RD's Notes for Next Visit . Assess pts adherence to goals     MONITORING & EVALUATION Dietary intake, weekly physical activity, body weight, and pre-op goals reached at next nutrition visit.    Next Steps  Patient is to follow up at [facility] for Pre-Op Class >2 weeks before surgery for further nutrition education.

## 2020-09-16 ENCOUNTER — Other Ambulatory Visit: Payer: Self-pay

## 2020-09-16 ENCOUNTER — Encounter: Payer: Self-pay | Admitting: General Practice

## 2020-09-27 ENCOUNTER — Encounter: Payer: 59 | Attending: Surgery | Admitting: Skilled Nursing Facility1

## 2020-09-27 DIAGNOSIS — E119 Type 2 diabetes mellitus without complications: Secondary | ICD-10-CM | POA: Diagnosis not present

## 2020-09-27 NOTE — Progress Notes (Signed)
Supervised Weight Loss Visit Bariatric Nutrition Education  NUTRITION ASSESSMENT  Virtual visit: pt identified by name and DOB, pt agrees to limitations of this visit type  Pt completed visits    Anthropometrics  Start weight at [facility]: 277.5 lbs (date: 09/05/2020)  Height: 72 in BMI: 37.64 kg/m2     Clinical  Medical hx: GERD, anxiety, depression, diabetes, IBS-D, hypogonadism  Medications: gummy probiotics  Labs: A1C 6.2 Notable signs/symptoms: gynecomastia, IBS-D Any previous deficiencies? Vitamin D  Lifestyle & Dietary Hx  Pt states he has Not been eating seconds, eating at home for breakfast which has been less stressful, drinking more water, drinking less soda, not drinking energy drinks anymore, working on portions at dinner. Pt states snacking after dinner has been tough and will go through his snack pantry activly trying to work more on this.    Pt states maybe snacking might be tougher even after surgery and feels he will be successful with more motivation and walking with his brother.   Pt states since he has to do 6 months with active health he may make another appt with dietitian before his surgery to check in.  Keep a tally  Estimated daily fluid intake:  oz Supplements:  Current average weekly physical activity:   24-Hr Dietary Recall First Meal: [place] sausage + english muffin + cheese + ketchup Snack:  Second Meal: j[employer] deli: club sandwich some chips Snack:  Third Meal: spagetti + salad + bread Snack: chips and such Beverages: water, water with a splash of juice, less soda  Estimated Energy Needs Calories: 1800   NUTRITION DIAGNOSIS  Overweight/obesity ([place]-3.3) related to past poor dietary habits and physical inactivity as evidenced by patient w/ planned sleeve surgery following dietary guidelines for continued weight loss.   NUTRITION INTERVENTION  Nutrition counseling (C-1) and education (E-2) to facilitate bariatric surgery goals.  Pre-Op  Goals Progress & New Goals . Make a tally for each snack you eat after dinner and look for a pattern at the end of the week  Handouts Provided Include   N/A  Learning Style & Readiness for Change Teaching method utilized: Visual & Auditory  Demonstrated degree of understanding via: Teach Back  Readiness Level: Action Barriers to learning/adherence to lifestyle change: working though snacking  RD's Notes for next Visit  . Pts pts adherence to chosen gaols   MONITORING & EVALUATION Dietary intake, weekly physical activity, body weight, and pre-op goals   Next Steps  Patient is to return to [facility] for pre-op class

## 2020-10-25 DIAGNOSIS — F411 Generalized anxiety disorder: Secondary | ICD-10-CM | POA: Diagnosis not present

## 2020-10-25 DIAGNOSIS — F339 Major depressive disorder, recurrent, unspecified: Secondary | ICD-10-CM | POA: Diagnosis not present

## 2020-10-25 DIAGNOSIS — F4310 Post-traumatic stress disorder, unspecified: Secondary | ICD-10-CM | POA: Diagnosis not present

## 2020-10-25 DIAGNOSIS — F4010 Social phobia, unspecified: Secondary | ICD-10-CM | POA: Diagnosis not present

## 2020-10-25 DIAGNOSIS — F410 Panic disorder [episodic paroxysmal anxiety] without agoraphobia: Secondary | ICD-10-CM | POA: Diagnosis not present

## 2020-11-21 ENCOUNTER — Other Ambulatory Visit: Payer: Self-pay | Admitting: Family Medicine

## 2020-11-21 NOTE — Telephone Encounter (Signed)
Please contact the patient for hypertension follow-up appt. Past due since 09/2020 per Dr. Zigmund Daniel notes.   Thanks

## 2020-11-22 NOTE — Telephone Encounter (Signed)
Patient scheduled for 11/25/20. AM

## 2020-11-25 ENCOUNTER — Encounter: Payer: Self-pay | Admitting: Family Medicine

## 2020-11-25 ENCOUNTER — Ambulatory Visit: Payer: 59 | Admitting: Family Medicine

## 2020-11-25 ENCOUNTER — Other Ambulatory Visit: Payer: Self-pay

## 2020-11-25 VITALS — BP 129/81 | HR 83 | Temp 97.2°F | Ht 72.0 in | Wt 280.3 lb

## 2020-11-25 DIAGNOSIS — I10 Essential (primary) hypertension: Secondary | ICD-10-CM

## 2020-11-25 DIAGNOSIS — F411 Generalized anxiety disorder: Secondary | ICD-10-CM | POA: Diagnosis not present

## 2020-11-25 DIAGNOSIS — H5203 Hypermetropia, bilateral: Secondary | ICD-10-CM | POA: Diagnosis not present

## 2020-11-25 DIAGNOSIS — E119 Type 2 diabetes mellitus without complications: Secondary | ICD-10-CM

## 2020-11-25 DIAGNOSIS — E781 Pure hyperglyceridemia: Secondary | ICD-10-CM

## 2020-11-25 MED ORDER — GEMFIBROZIL 600 MG PO TABS
ORAL_TABLET | ORAL | 2 refills | Status: DC
Start: 2020-11-25 — End: 2020-12-08

## 2020-11-25 MED ORDER — AMLODIPINE BESYLATE 5 MG PO TABS: 1.0000 | ORAL_TABLET | Freq: Every morning | ORAL | 1 refills | Status: DC

## 2020-11-25 MED ORDER — LISINOPRIL-HYDROCHLOROTHIAZIDE 20-25 MG PO TABS
1.0000 | ORAL_TABLET | Freq: Every day | ORAL | 2 refills | Status: DC
Start: 2020-11-25 — End: 2021-09-01

## 2020-11-25 MED ORDER — METFORMIN HCL 500 MG PO TABS
1.0000 | ORAL_TABLET | Freq: Two times a day (BID) | ORAL | 1 refills | Status: DC
Start: 2020-11-25 — End: 2021-02-14

## 2020-11-25 MED ORDER — POTASSIUM CHLORIDE CRYS ER 10 MEQ PO TBCR
10.0000 meq | EXTENDED_RELEASE_TABLET | Freq: Every day | ORAL | 1 refills | Status: DC
Start: 2020-11-25 — End: 2021-06-05

## 2020-11-25 MED ORDER — ATENOLOL 100 MG PO TABS
100.0000 mg | ORAL_TABLET | Freq: Every morning | ORAL | 1 refills | Status: DC
Start: 2020-11-25 — End: 2021-06-05

## 2020-11-25 MED ORDER — ROSUVASTATIN CALCIUM 10 MG PO TABS
10.0000 mg | ORAL_TABLET | Freq: Every day | ORAL | 1 refills | Status: DC
Start: 2020-11-25 — End: 2021-12-05

## 2020-11-25 NOTE — Assessment & Plan Note (Signed)
Current management with metformin.  Weight is up some since last visit.  Discussed if A1c has increased I would recommend adding Ozempic to help with blood sugars and weight management.  Side effect profile of GLP-1 medications discussed.

## 2020-11-25 NOTE — Assessment & Plan Note (Signed)
Body mass index is 38.02 kg/m.  Currently undergoing evaluation for bariatric surgery.

## 2020-11-25 NOTE — Assessment & Plan Note (Signed)
Blood pressure is at goal at for age and co-morbidities.  I recommend continuation of current antihypertensive medications.  In addition they were instructed to follow a low sodium diet with regular exercise to help to maintain adequate control of blood pressure.

## 2020-11-25 NOTE — Assessment & Plan Note (Signed)
Stable, managed by psychiatry.

## 2020-11-25 NOTE — Progress Notes (Signed)
Tyler Gross - 45 y.o. male MRN [id number]  Date of birth: 1975-08-17  Subjective Chief Complaint  Patient presents with  . Hypertension    HPI Tyler Gross is a 45 y.o. male here today for follow up visit.  He has a history of T2DM, HTN, HLD, depression and anxiety.  He is currently working as a Marine scientist in the psychiatric unit.  He is also enrolled in school to get DNP.    He is being evaluated for bariatric surgery.  He is seeing nutritionist and psychiatrist.  He is hoping to have surgery later this summer.  His weight is up some since his last visit with me.  He is not checking blood pressure or blood sugars at home on a regular basis.  He denies symptoms related to HTN or DM.  He is taking medications as prescribed.   He continues to do well with maintaining his sobriety.    ROS:  A comprehensive ROS was completed and negative except as noted per HPI  Allergies  Allergen Reactions  . Atorvastatin Other (See Comments)    Muscle fatigue  . Bee Venom Swelling  . Sulfamethoxazole-Trimethoprim Hives    Bactrim     Past Medical History:  Diagnosis Date  . Acne    on back taking doxycycline for  . Alcoholism ([place])    sober since 04/2018  . Anal lesion    nonhealing  . Anxiety   . Depression   . DM type 2 (diabetes mellitus, type 2) ([place])   . ED (erectile dysfunction)   . GAD (generalized anxiety disorder)   . GERD (gastroesophageal reflux disease)   . Gynecomastia, male    followed by dr Shellia Cleverly  . High serum estradiol    resolved  . History of anal fissures   . History of kidney stones   . History of small bowel obstruction 2004  . Hypertension   . IBS (irritable bowel syndrome)   . Mixed hyperlipidemia   . OSA on CPAP    per last study 07-31-2013  severe osa  . Primary hypogonadism in male    endocrinologist-  dr Shellia Cleverly  . Sleep apnea   . Wears glasses     Past Surgical History:  Procedure Laterality Date  . CARDIOVASCULAR STRESS TEST  10/05/2009    normal nuclear study w/ no ischemia/  normal LV function and wall motion , ef 71%  . COLONOSCOPY  last one   2004, april 2021  . EVALUATION UNDER ANESTHESIA WITH ANAL FISTULECTOMY N/A 02/13/2018   Procedure: ANAL EXAM UNDER ANESTHESIA WITH BIOPSY;  Surgeon: [name], MD;  Location: [place];  Service: General;  Laterality: N/A;  . ORCHIECTOMY Left 1991   w/ placement prosthesis (for torsion)  . TESTICULAR EXPLORATION Left 01/29/2020   Procedure: EXCHANGE OF LEFT TESTICULAR PROSTHESIS;  Surgeon: Cleon Gustin, MD;  Location: [facility];  Service: Urology;  Laterality: Left;  . URETEROLITHOTOMY  1999    Social History   Socioeconomic History  . Marital status: Single    Spouse name: Not on file  . Number of children: Not on file  . Years of education: Not on file  . Highest education level: Not on file  Occupational History  . Occupation: Therapist, sports   Tobacco Use  . Smoking status: Former Smoker    Packs/day: 1.00    Years: 10.00    Pack years: 10.00    Types: Cigarettes    Quit date:  02/07/2009    Years since quitting: 11.8  . Smokeless tobacco: Never Used  Vaping Use  . Vaping Use: Never used  Substance and Sexual Activity  . Alcohol use: Not Currently    Comment: sober 04/09/2018  . Drug use: No  . Sexual activity: Yes    Partners: Male  Other Topics Concern  . Not on file  Social History Narrative   Lives with partner   Regular exercise: no   Caffeine use: 1 large cup of coffee daily; 2 to 3 sodas in the evening   Social Determinants of Health   Financial Resource Strain: Not on file  Food Insecurity: Not on file  Transportation Needs: Not on file  Physical Activity: Not on file  Stress: Not on file  Social Connections: Not on file    Family History  Problem Relation Age of Onset  . Cancer Mother        breast cancer: stage I  . Hypertension Mother   . Irritable bowel syndrome Mother   . Heart disease Maternal  Grandfather   . Diabetes Paternal Grandmother   . Colon cancer Maternal Aunt        great aunt  . Esophageal cancer Neg Hx   . Rectal cancer Neg Hx   . Stomach cancer Neg Hx     Health Maintenance  Topic Date Due  . Hepatitis C Screening  Never done  . OPHTHALMOLOGY EXAM  Never done  . COVID-19 Vaccine (3 - Booster) 02/21/2020  . HEMOGLOBIN A1C  12/26/2020  . INFLUENZA VACCINE  03/06/2021  . FOOT EXAM  11/25/2021  . TETANUS/TDAP  01/27/2024  . COLONOSCOPY (Pts 45-23yrs Insurance coverage will need to be confirmed)  10/07/2029  . PNEUMOCOCCAL POLYSACCHARIDE VACCINE AGE 68-64 HIGH RISK  Completed  . HIV Screening  Completed  . HPV VACCINES  Aged Out     ----------------------------------------------------------------------------------------------------------------------------------------------------------------------------------------------------------------- Physical Exam BP 129/81 (BP Location: Left Arm, Patient Position: Sitting, Cuff Size: Large)   Pulse 83   Temp (!) 97.2 F (36.2 C)   Ht 6' (1.829 m)   Wt 280 lb 4.8 oz (127.1 kg)   SpO2 99%   BMI 38.02 kg/m   Physical Exam Constitutional:      Appearance: Normal appearance.  Cardiovascular:     Rate and Rhythm: Normal rate and regular rhythm.  Pulmonary:     Effort: Pulmonary effort is normal.     Breath sounds: Normal breath sounds.  Neurological:     Mental Status: He is alert.  Psychiatric:        Mood and Affect: Mood normal.        Behavior: Behavior normal.     ------------------------------------------------------------------------------------------------------------------------------------------------------------------------------------------------------------------- Assessment and Plan  Type 2 diabetes mellitus without complication, without long-term current use of insulin (HCC) Current management with metformin.  Weight is up some since last visit.  Discussed if A1c has increased I would recommend  adding Ozempic to help with blood sugars and weight management.  Side effect profile of GLP-1 medications discussed.   Essential hypertension Blood pressure is at goal at for age and co-morbidities.  I recommend continuation of current antihypertensive medications.  In addition they were instructed to follow a low sodium diet with regular exercise to help to maintain adequate control of blood pressure.    GAD (generalized anxiety disorder) Stable, managed by psychiatry.   Morbid obesity ([place]) Body mass index is 38.02 kg/m.  Currently undergoing evaluation for bariatric surgery.   Hypertriglyceridemia Update lipid panel.  Gemfibrozil and crestor refilled.    Meds ordered this encounter  Medications  . atenolol (TENORMIN) 100 MG tablet    Sig: Take 1 tablet (100 mg total) by mouth every morning.    Dispense:  90 tablet    Refill:  1  . gemfibrozil (LOPID) 600 MG tablet    Sig: TAKE ONE TABLET BY MOUTH TWICE A DAY BEFORE A MEAL    Dispense:  180 tablet    Refill:  2  . lisinopril-hydrochlorothiazide (ZESTORETIC) 20-25 MG tablet    Sig: Take 1 tablet by mouth daily.    Dispense:  90 tablet    Refill:  2  . metFORMIN (GLUCOPHAGE) 500 MG tablet    Sig: Take 1 tablet (500 mg total) by mouth 2 (two) times daily with a meal.    Dispense:  180 tablet    Refill:  1  . rosuvastatin (CRESTOR) 10 MG tablet    Sig: Take 1 tablet (10 mg total) by mouth at bedtime.    Dispense:  90 tablet    Refill:  1  . potassium chloride (KLOR-CON) 10 MEQ tablet    Sig: Take 1 tablet (10 mEq total) by mouth daily.    Dispense:  90 tablet    Refill:  1  . amLODipine (NORVASC) 5 MG tablet    Sig: Take 1 tablet (5 mg total) by mouth every morning.    Dispense:  90 tablet    Refill:  1    Return in about 6 months (around 05/27/2021) for HTN.    This visit occurred during the SARS-CoV-2 public health emergency.  Safety protocols were in place, including screening questions prior to the visit,  additional usage of staff PPE, and extensive cleaning of exam room while observing appropriate contact time as indicated for disinfecting solutions.

## 2020-11-25 NOTE — Patient Instructions (Signed)
Great to see you today! Have labs completed.  Continue current medications See me again in 6 months.  

## 2020-11-25 NOTE — Assessment & Plan Note (Signed)
Update lipid panel.  Gemfibrozil and crestor refilled.

## 2020-11-26 LAB — CBC WITH DIFFERENTIAL/PLATELET
Absolute Monocytes: 564 cells/uL (ref 200–950)
Basophils Absolute: 58 cells/uL (ref 0–200)
Basophils Relative: 0.7 %
Eosinophils Absolute: 299 cells/uL (ref 15–500)
Eosinophils Relative: 3.6 %
HCT: 43.9 % (ref 38.5–50.0)
Hemoglobin: 14.2 g/dL (ref 13.2–17.1)
Lymphs Abs: 2141 cells/uL (ref 850–3900)
MCH: 28.1 pg (ref 27.0–33.0)
MCHC: 32.3 g/dL (ref 32.0–36.0)
MCV: 86.8 fL (ref 80.0–100.0)
MPV: 10.6 fL (ref 7.5–12.5)
Monocytes Relative: 6.8 %
Neutro Abs: 5237 cells/uL (ref 1500–7800)
Neutrophils Relative %: 63.1 %
Platelets: 264 10*3/uL (ref 140–400)
RBC: 5.06 10*6/uL (ref 4.20–5.80)
RDW: 13.9 % (ref 11.0–15.0)
Total Lymphocyte: 25.8 %
WBC: 8.3 10*3/uL (ref 3.8–10.8)

## 2020-11-26 LAB — LIPID PANEL W/REFLEX DIRECT LDL
Cholesterol: 195 mg/dL (ref <200)
HDL: 32 mg/dL — ABNORMAL LOW (ref >=40)
Non-HDL Cholesterol (Calc): 163 mg/dL (calc) — ABNORMAL HIGH (ref <130)
Total CHOL/HDL Ratio: 6.1 (calc) — ABNORMAL HIGH (ref <5.0)
Triglycerides: 780 mg/dL — ABNORMAL HIGH (ref <150)

## 2020-11-26 LAB — COMPLETE METABOLIC PANEL WITH GFR
AG Ratio: 2.2 (calc) (ref 1.0–2.5)
ALT: 39 U/L (ref 9–46)
AST: 39 U/L (ref 10–40)
Albumin: 4.6 g/dL (ref 3.6–5.1)
Alkaline phosphatase (APISO): 78 U/L (ref 36–130)
BUN: 14 mg/dL (ref 7–25)
CO2: 26 mmol/L (ref 20–32)
Calcium: 9.7 mg/dL (ref 8.6–10.3)
Chloride: 102 mmol/L (ref 98–110)
Creat: 1.01 mg/dL (ref 0.60–1.35)
GFR, Est African American: 103 mL/min/{1.73_m2} (ref >=60)
GFR, Est Non African American: 89 mL/min/{1.73_m2} (ref >=60)
Globulin: 2.1 g/dL (calc) (ref 1.9–3.7)
Glucose, Bld: 243 mg/dL — ABNORMAL HIGH (ref 65–139)
Potassium: 4.4 mmol/L (ref 3.5–5.3)
Sodium: 139 mmol/L (ref 135–146)
Total Bilirubin: 0.5 mg/dL (ref 0.2–1.2)
Total Protein: 6.7 g/dL (ref 6.1–8.1)

## 2020-11-26 LAB — HEMOGLOBIN A1C
Hgb A1c MFr Bld: 8.5 % of total Hgb — ABNORMAL HIGH (ref <5.7)
Mean Plasma Glucose: 197 mg/dL
eAG (mmol/L): 10.9 mmol/L

## 2020-11-26 LAB — DIRECT LDL: Direct LDL: 71 mg/dL (ref <100)

## 2020-11-28 ENCOUNTER — Encounter: Payer: Self-pay | Admitting: Family Medicine

## 2020-11-29 ENCOUNTER — Other Ambulatory Visit: Payer: Self-pay | Admitting: Family Medicine

## 2020-11-29 ENCOUNTER — Encounter: Payer: Self-pay | Admitting: Family Medicine

## 2020-11-29 ENCOUNTER — Other Ambulatory Visit: Payer: Self-pay

## 2020-11-29 ENCOUNTER — Other Ambulatory Visit (HOSPITAL_BASED_OUTPATIENT_CLINIC_OR_DEPARTMENT_OTHER): Payer: Self-pay

## 2020-11-29 MED ORDER — OZEMPIC (0.25 OR 0.5 MG/DOSE) 2 MG/1.5ML ~~LOC~~ SOPN: PEN_INJECTOR | SUBCUTANEOUS | 3 refills | Status: DC

## 2020-11-29 MED ORDER — OZEMPIC (0.25 OR 0.5 MG/DOSE) 2 MG/1.5ML ~~LOC~~ SOPN
PEN_INJECTOR | SUBCUTANEOUS | 3 refills | Status: DC
  Filled 2020-11-29 (×2): qty 1.5, 42d supply, fill #0
  Filled 2020-12-21: qty 1.5, 42d supply, fill #1
  Filled 2021-01-02: qty 4.5, 84d supply, fill #1

## 2020-11-29 MED ORDER — ICOSAPENT ETHYL 1 G PO CAPS
2.0000 g | ORAL_CAPSULE | Freq: Two times a day (BID) | ORAL | 2 refills | Status: DC
  Filled 2020-11-29: qty 360, 90d supply, fill #0
  Filled 2021-02-22: qty 360, 90d supply, fill #1
  Filled 2021-05-28: qty 360, 90d supply, fill #2

## 2020-11-30 ENCOUNTER — Other Ambulatory Visit (HOSPITAL_BASED_OUTPATIENT_CLINIC_OR_DEPARTMENT_OTHER): Payer: Self-pay

## 2020-12-01 ENCOUNTER — Encounter: Payer: Self-pay | Admitting: Family Medicine

## 2020-12-07 ENCOUNTER — Encounter: Payer: Self-pay | Admitting: Cardiology

## 2020-12-07 ENCOUNTER — Ambulatory Visit: Payer: 59 | Admitting: Cardiology

## 2020-12-07 NOTE — Progress Notes (Signed)
Cardiology Office Note   Date:  12/08/2020   ID:  Tyler Gross, DOB 13-Feb-1976, MRN [id number]  PCP:  Tyler Nutting, DO  Cardiologist:   None Referring:  Tyler Nutting, DO  Chief Complaint  Patient presents with  . Chest Pain      History of Present Illness: Tyler Gross is a 45 y.o. male who is referred by Tyler Gross, DOfor evaluation of chest pain.   He has no prior cardiac history other than a stress perfusion study in 2011.  In the fall he had some chest discomfort and palpitations.  He was going to see a cardiologist but then he got COVID and eventually he did not have the discomfort until the last 1 to 2 months.  Now he is getting this again.  It happens sporadically.  It is almost daily now.  He has a tightness.  It can be up to 7 out of 10.  It might last for hours.  It happens at rest.  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  With it he might get short of breath.  He does not have radiation to his jaw or to his arms.  It is substernal.  There is no real associated nausea vomiting or diaphoresis.  He has to let it gradually go away and sometimes taking deep breaths will help although he might have some difficulty taking deep breaths.  He does not describe PND or orthopnea.  He does have sleep apnea and wears CPAP.   Past Medical History:  Diagnosis Date  . Acne    on back taking doxycycline for  . Alcoholism ([place])    sober since 04/2018  . Anxiety   . Depression   . DM type 2 (diabetes mellitus, type 2) ([place])   . ED (erectile dysfunction)   . GAD (generalized anxiety disorder)   . GERD (gastroesophageal reflux disease)   . Gynecomastia, male    followed by dr Tyler Gross  . High serum estradiol    resolved  . History of anal fissures   . History of kidney stones   . History of small bowel obstruction 2004  . Hypertension   . IBS (irritable bowel syndrome)   . Mixed hyperlipidemia   . OSA  on CPAP    per last study 07-31-2013  severe osa  . Primary hypogonadism in male    endocrinologist-  dr Tyler Gross    Past Surgical History:  Procedure Laterality Date  . CARDIOVASCULAR STRESS TEST  10/05/2009   normal nuclear study w/ no ischemia/  normal LV function and wall motion , ef 71%  . COLONOSCOPY  last one   2004, april 2021  . EVALUATION UNDER ANESTHESIA WITH ANAL FISTULECTOMY N/A 02/13/2018   Procedure: ANAL EXAM UNDER ANESTHESIA WITH BIOPSY;  Surgeon: Tyler Ruff, MD;  Location: [place];  Service: General;  Laterality: N/A;  . ORCHIECTOMY Left 1991   w/ placement prosthesis (for torsion)  . TESTICULAR EXPLORATION Left 01/29/2020   Procedure: EXCHANGE OF LEFT TESTICULAR PROSTHESIS;  Surgeon: Tyler Gustin, MD;  [facility];  Service: Urology;  Laterality: Left;  . URETEROLITHOTOMY  1999     Current Outpatient Medications  Medication Sig Dispense Refill  . amLODipine (NORVASC) 5 MG tablet Take 1 tablet (5 mg total) by mouth every morning. 90 tablet 1  . anastrozole (ARIMIDEX) 1 MG tablet TAKE ONE TABLET  BY MOUTH DAILY 90 tablet 3  . atenolol (TENORMIN) 100 MG tablet Take 1 tablet (100 mg total) by mouth every morning. 90 tablet 1  . Calcium Carbonate-Vitamin D (CALCIUM-VITAMIN D) 600-125 MG-UNIT TABS 1 tablet daily.    . clomiPHENE (CLOMID) 50 MG tablet Take 0.5 tablets (25 mg total) by mouth daily. 15 tablet 11  . DULoxetine (CYMBALTA) 60 MG capsule Take 1 capsule (60 mg total) by mouth daily. For mood control 30 capsule 0  . gabapentin (NEURONTIN) 600 MG tablet Take 600 mg by mouth 2 (two) times daily.     Marland Kitchen icosapent Ethyl (VASCEPA) 1 g capsule Take 2 capsules (2 g total) by mouth 2 (two) times daily. 360 capsule 2  . lisinopril-hydrochlorothiazide (ZESTORETIC) 20-25 MG tablet Take 1 tablet by mouth daily. 90 tablet 2  . metFORMIN (GLUCOPHAGE) 500 MG tablet Take 1 tablet (500 mg total) by mouth 2 (two) times daily with a  meal. 180 tablet 1  . metoprolol tartrate (LOPRESSOR) 100 MG tablet Take 1 tablet (100 mg total) by mouth once for 1 dose. 2 hours prior to CTA 1 tablet 0  . Multiple Vitamin (MULTIVITAMIN WITH MINERALS) TABS tablet Take 1 tablet by mouth daily.    . Omega-3 Fatty Acids (FISH OIL) 1000 MG CPDR Take by mouth.    Marland Kitchen OVER THE COUNTER MEDICATION Cranberry 1 daily    . OVER THE COUNTER MEDICATION Vitamin b 12 daily    . pantoprazole (PROTONIX) 40 MG tablet Take 1 tablet (40 mg total) by mouth 2 (two) times daily. 90 tablet 5  . potassium chloride (KLOR-CON) 10 MEQ tablet Take 1 tablet (10 mEq total) by mouth daily. 90 tablet 1  . rosuvastatin (CRESTOR) 10 MG tablet Take 1 tablet (10 mg total) by mouth at bedtime. 90 tablet 1  . Semaglutide,0.25 or 0.5MG /DOS, (OZEMPIC, 0.25 OR 0.5 MG/DOSE,) 2 MG/1.5ML SOPN Start by injectiong 0.25mg  weekly for 4 weeks then increase to 0.5mg  weekly (Patient taking differently: Start by injectiong 0.25mg  weekly for 4 weeks then increase to 0.5mg  weekly) 1.5 mL 3  . UNABLE TO FIND Vitamin d daily     No current facility-administered medications for this visit.    Allergies:   Atorvastatin, Bee venom, and Sulfamethoxazole-trimethoprim    Social History:  The patient  reports that he quit smoking about 11 years ago. His smoking use included cigarettes. He has a 10.00 pack-year smoking history. He has never used smokeless tobacco. He reports previous alcohol use. He reports that he does not use drugs.   Family History:  The patient's family history includes Cancer in his mother; Colon cancer in his maternal aunt; Diabetes in his paternal grandmother; Heart disease in his maternal grandfather; Hypertension in his mother; Irritable bowel syndrome in his mother.  Of note he does not know his father's history   ROS:  Please see the history of present illness.   Otherwise, review of systems are positive for none.   All other systems are reviewed and negative.    PHYSICAL  EXAM: VS:  BP (!) 150/90   Pulse 96   Ht 6' (1.829 m)   Wt 278 lb 12.8 oz (126.5 kg)   SpO2 97%   BMI 37.81 kg/m  , BMI Body mass index is 37.81 kg/m. GENERAL:  Well appearing HEENT:  Pupils equal round and reactive, fundi not visualized, oral mucosa unremarkable NECK:  No jugular venous distention, waveform within normal limits, carotid upstroke brisk and symmetric, no bruits, no thyromegaly  LYMPHATICS:  No cervical, inguinal adenopathy LUNGS:  Clear to auscultation bilaterally BACK:  No CVA tenderness CHEST:  Unremarkable HEART:  PMI not displaced or sustained,S1 and S2 within normal limits, no S3, no S4, no clicks, no rubs, no murmurs ABD:  Flat, positive bowel sounds normal in frequency in pitch, no bruits, no rebound, no guarding, no midline pulsatile mass, no hepatomegaly, no splenomegaly EXT:  2 plus pulses throughout, no edema, no cyanosis no clubbing SKIN:  No rashes no nodules NEURO:  Cranial nerves II through XII grossly intact, motor grossly intact throughout PSYCH:  Cognitively intact, oriented to person place and time    EKG:  EKG is ordered today. The ekg ordered today demonstrates sinus rhythm, rate 88, axis within normal limits, intervals within normal limits, no acute ST-T wave changes.   Recent Labs: 11/25/2020: ALT 39; BUN 14; Creat 1.01; Hemoglobin 14.2; Platelets 264; Potassium 4.4; Sodium 139    Lipid Panel    Component Value Date/Time   CHOL 195 11/25/2020 0832   TRIG 780 (H) 11/25/2020 0832   HDL 32 (L) 11/25/2020 0832   CHOLHDL 6.1 (H) 11/25/2020 0832   VLDL 23.8 08/19/2019 0858   LDLCALC  11/25/2020 0832     Comment:     . LDL cholesterol not calculated. Triglyceride levels greater than 400 mg/dL invalidate calculated LDL results. . Reference range: <100 . Desirable range <100 mg/dL for primary prevention;   <70 mg/dL for patients with CHD or diabetic patients  with > or = 2 CHD risk factors. Marland Kitchen LDL-C is now calculated using the  Martin-Hopkins  calculation, which is a validated novel method providing  better accuracy than the Friedewald equation in the  estimation of LDL-C.  Cresenciano Genre et al. Annamaria Helling. [id number]): 2061-2068  (http://education.[employer].com/faq/FAQ164)    LDLDIRECT 71 11/25/2020 0832      Wt Readings from Last 3 Encounters:  12/08/20 278 lb 12.8 oz (126.5 kg)  11/25/20 280 lb 4.8 oz (127.1 kg)  09/05/20 277 lb 8 oz (125.9 kg)      Other studies Reviewed: Additional studies/ records that were reviewed today include: Labs. Review of the above records demonstrates:  Please see elsewhere in the note.     ASSESSMENT AND PLAN:  CHEST PAIN: He has significant cardiovascular risk factors.  His pretest probability of obstructive coronary disease is at least moderately high.  Given this imaging is indicated with coronary CT angiography.  He also needs aggressive risk reduction.  DM: His A1c which has been controlled is now up to 8.5.  He has been started on semaglutide.  I will defer to Tyler Nutting, DO.  We did talk about low carbohydrate  DYSLIPIDEMIA: His triglycerides are markedly elevated probably related to his blood sugars recently.  We talked about this and diet control as well.  HTN: His blood pressure is elevated today.  He is making a blood pressure diary and he might need a higher dose of lisinopril.  OBESITY: He is going for consideration of bariatric surgery.   Current medicines are reviewed at length with the patient today.  The patient does not have concerns regarding medicines.  The following changes have been made:  no change  Labs/ tests ordered today include:   Orders Placed This Encounter  Procedures  . CT CORONARY MORPH W/CTA COR W/SCORE W/CA W/CM &/OR WO/CM  . CT CORONARY FRACTIONAL FLOW RESERVE DATA PREP  . CT CORONARY FRACTIONAL FLOW RESERVE FLUID ANALYSIS  . Basic Metabolic Panel (BMET)  .  EKG 12-Lead     Disposition:   FU with me based on the results  of the above   Signed, Minus Breeding, MD  12/08/2020 9:03 AM    [place]

## 2020-12-08 ENCOUNTER — Ambulatory Visit: Payer: 59 | Admitting: Cardiology

## 2020-12-08 ENCOUNTER — Encounter: Payer: Self-pay | Admitting: Cardiology

## 2020-12-08 ENCOUNTER — Other Ambulatory Visit: Payer: Self-pay

## 2020-12-08 VITALS — BP 150/90 | HR 96 | Ht 72.0 in | Wt 278.8 lb

## 2020-12-08 DIAGNOSIS — R072 Precordial pain: Secondary | ICD-10-CM

## 2020-12-08 DIAGNOSIS — I10 Essential (primary) hypertension: Secondary | ICD-10-CM

## 2020-12-08 DIAGNOSIS — E785 Hyperlipidemia, unspecified: Secondary | ICD-10-CM

## 2020-12-08 DIAGNOSIS — E118 Type 2 diabetes mellitus with unspecified complications: Secondary | ICD-10-CM | POA: Diagnosis not present

## 2020-12-08 DIAGNOSIS — Z79899 Other long term (current) drug therapy: Secondary | ICD-10-CM | POA: Diagnosis not present

## 2020-12-08 DIAGNOSIS — E291 Testicular hypofunction: Secondary | ICD-10-CM | POA: Diagnosis not present

## 2020-12-08 MED ORDER — METOPROLOL TARTRATE 100 MG PO TABS: 100.0000 mg | ORAL_TABLET | Freq: Once | ORAL | 0 refills | Status: DC

## 2020-12-08 NOTE — Patient Instructions (Addendum)
Medication Instructions:  Continue current medications  *If you need a refill on your cardiac medications before your next appointment, please call your pharmacy*   Lab Work: BMP  If you have labs (blood work) drawn today and your tests are completely normal, you will receive your results only by: Marland Kitchen MyChart Message (if you have MyChart) OR . A paper copy in the mail If you have any lab test that is abnormal or we need to change your treatment, we will call you to review the results.   Testing/Procedures: Non-Cardiac CT Angiography (CTA), is a special type of CT scan that uses a computer to produce multi-dimensional views of major blood vessels throughout the body. In CT angiography, a contrast material is injected through an IV to help visualize the blood vessels  Follow-Up: At [facility], you and your health needs are our priority.  As part of our continuing mission to provide you with exceptional heart care, we have created designated Provider Care Teams.  These Care Teams include your primary Cardiologist (physician) and Advanced Practice Providers (APPs -  Physician Assistants and Nurse Practitioners) who all work together to provide you with the care you need, when you need it.  We recommend signing up for the patient portal called "MyChart".  Sign up information is provided on this After Visit Summary.  MyChart is used to connect with patients for Virtual Visits (Telemedicine).  Patients are able to view lab/test results, encounter notes, upcoming appointments, etc.  Non-urgent messages can be sent to your provider as well.   To learn more about what you can do with MyChart, go to [email or web address].    Your next appointment:   As Needed   Other Instructions Your cardiac CT will be scheduled at one of the below locations:   [facility] [address] [place], [place] [address] [phone]  [place] [address] [place],  [address] [phone]  If scheduled at [facility], please arrive at the [facility] main entrance (entrance A) of [facility] 30 minutes prior to test start time. Proceed to the [facility] Radiology Department (first floor) to check-in and test prep.  If scheduled at [facility], please arrive 15 mins early for check-in and test prep.  Please follow these instructions carefully (unless otherwise directed):  Hold all erectile dysfunction medications at least 3 days (72 hrs) prior to test.  On the Night Before the Test: . Be sure to Drink plenty of water. . Do not consume any caffeinated/decaffeinated beverages or chocolate 12 hours prior to your test. . Do not take any antihistamines 12 hours prior to your test.   On the Day of the Test: . Drink plenty of water until 1 hour prior to the test. . Do not eat any food 4 hours prior to the test. . You may take your regular medications prior to the test.  . Take metoprolol (Lopressor) two hours prior to test. . HOLD Furosemide/Hydrochlorothiazide morning of the test.    After the Test: . Drink plenty of water. . After receiving IV contrast, you may experience a mild flushed feeling. This is normal. . On occasion, you may experience a mild rash up to 24 hours after the test. This is not dangerous. If this occurs, you can take Benadryl 25 mg and increase your fluid intake. . If you experience trouble breathing, this can be serious. If it is severe call  911 IMMEDIATELY. If it is mild, please call our office. . If you take any of these medications: Glipizide/Metformin, Avandament, Glucavance, please do not take 48 hours after completing test unless otherwise instructed.   Once we have confirmed authorization from your insurance company, we will call you to set up a date and time for your test. Based on how quickly your insurance processes prior authorizations  requests, please allow up to 4 weeks to be contacted for scheduling your Cardiac CT appointment. Be advised that routine Cardiac CT appointments could be scheduled as many as 8 weeks after your provider has ordered it.  For non-scheduling related questions, please contact the cardiac imaging nurse navigator should you have any questions/concerns: Marchia Bond, Cardiac Imaging Nurse Navigator Gordy Clement, Cardiac Imaging Nurse Navigator [facility] Direct Office Dial: [phone]   For scheduling needs, including cancellations and rescheduling, please call [place], [phone].

## 2020-12-12 ENCOUNTER — Encounter: Payer: Self-pay | Admitting: Family Medicine

## 2020-12-14 ENCOUNTER — Other Ambulatory Visit (HOSPITAL_COMMUNITY): Payer: Self-pay | Admitting: Emergency Medicine

## 2020-12-14 DIAGNOSIS — R072 Precordial pain: Secondary | ICD-10-CM

## 2020-12-14 MED ORDER — IVABRADINE HCL 5 MG PO TABS: 10.0000 mg | ORAL_TABLET | Freq: Once | ORAL | 0 refills | Status: AC

## 2020-12-14 NOTE — Progress Notes (Signed)
One time dose 10mg  ivabradine added to take with one time dose 100mg  metoprolol tartrate for upcoming CCTA for HR control.  Marchia Bond RN Navigator Cardiac Imaging [facility] [phone] Office  [phone] Cell

## 2020-12-15 DIAGNOSIS — N522 Drug-induced erectile dysfunction: Secondary | ICD-10-CM | POA: Diagnosis not present

## 2020-12-15 DIAGNOSIS — E291 Testicular hypofunction: Secondary | ICD-10-CM | POA: Diagnosis not present

## 2020-12-21 ENCOUNTER — Other Ambulatory Visit (HOSPITAL_BASED_OUTPATIENT_CLINIC_OR_DEPARTMENT_OTHER): Payer: Self-pay

## 2020-12-21 ENCOUNTER — Encounter: Payer: Self-pay | Admitting: Family Medicine

## 2020-12-22 ENCOUNTER — Other Ambulatory Visit (HOSPITAL_BASED_OUTPATIENT_CLINIC_OR_DEPARTMENT_OTHER): Payer: Self-pay

## 2020-12-23 DIAGNOSIS — R072 Precordial pain: Secondary | ICD-10-CM | POA: Diagnosis not present

## 2020-12-23 DIAGNOSIS — Z79899 Other long term (current) drug therapy: Secondary | ICD-10-CM | POA: Diagnosis not present

## 2020-12-24 LAB — BASIC METABOLIC PANEL
BUN/Creatinine Ratio: 16 (ref 9–20)
BUN: 15 mg/dL (ref 6–24)
CO2: 23 mmol/L (ref 20–29)
Calcium: 9.4 mg/dL (ref 8.7–10.2)
Chloride: 103 mmol/L (ref 96–106)
Creatinine, Ser: 0.91 mg/dL (ref 0.76–1.27)
Glucose: 123 mg/dL — ABNORMAL HIGH (ref 65–99)
Potassium: 4.7 mmol/L (ref 3.5–5.2)
Sodium: 143 mmol/L (ref 134–144)
eGFR: 105 mL/min/{1.73_m2} (ref >59)

## 2020-12-25 ENCOUNTER — Encounter: Payer: Self-pay | Admitting: Family Medicine

## 2020-12-26 ENCOUNTER — Telehealth (HOSPITAL_COMMUNITY): Payer: Self-pay | Admitting: Emergency Medicine

## 2020-12-26 NOTE — Telephone Encounter (Signed)
Reaching out to patient to offer assistance regarding upcoming cardiac imaging study; pt verbalizes understanding of appt date/time, parking situation and where to check in, pre-test NPO status and medications ordered, and verified current allergies; name and call back number provided for further questions should they arise Marchia Bond RN Navigator Cardiac Imaging [facility] [phone] office [phone] cell  100mg  Metop tart + 10mg  ivab 2 hr prior to scan Holding lisinopril-hctz, atenolol

## 2020-12-28 ENCOUNTER — Encounter: Payer: Self-pay | Admitting: Family Medicine

## 2020-12-28 ENCOUNTER — Ambulatory Visit (HOSPITAL_COMMUNITY)
Admission: RE | Admit: 2020-12-28 | Discharge: 2020-12-28 | Disposition: A | Discharge status: Home / Self Care | Payer: 59 | Source: Ambulatory Visit | Attending: Cardiology | Admitting: Cardiology

## 2020-12-28 ENCOUNTER — Other Ambulatory Visit: Payer: Self-pay

## 2020-12-28 ENCOUNTER — Ambulatory Visit: Payer: 59 | Admitting: Family Medicine

## 2020-12-28 DIAGNOSIS — S31109A Unspecified open wound of abdominal wall, unspecified quadrant without penetration into peritoneal cavity, initial encounter: Secondary | ICD-10-CM | POA: Insufficient documentation

## 2020-12-28 DIAGNOSIS — R072 Precordial pain: Secondary | ICD-10-CM | POA: Insufficient documentation

## 2020-12-28 MED ORDER — DILTIAZEM HCL 25 MG/5ML IV SOLN
INTRAVENOUS | Status: AC
  Administered 2020-12-28: 5 mg via INTRAVENOUS
  Filled 2020-12-28: qty 5

## 2020-12-28 MED ORDER — DILTIAZEM HCL 25 MG/5ML IV SOLN: 5.0000 mg | INTRAVENOUS | Status: DC | PRN

## 2020-12-28 MED ORDER — NITROGLYCERIN 0.4 MG SL SUBL
SUBLINGUAL_TABLET | SUBLINGUAL | Status: AC
  Administered 2020-12-28: 0.8 mg via SUBLINGUAL
  Filled 2020-12-28: qty 2

## 2020-12-28 MED ORDER — IOHEXOL 350 MG/ML SOLN
100.0000 mL | Freq: Once | INTRAVENOUS | Status: AC | PRN
  Administered 2020-12-28: 100 mL via INTRAVENOUS

## 2020-12-28 MED ORDER — NITROGLYCERIN 0.4 MG SL SUBL: 0.8000 mg | SUBLINGUAL_TABLET | Freq: Once | SUBLINGUAL | Status: AC

## 2020-12-28 NOTE — Progress Notes (Signed)
Tyler Gross - 45 y.o. male MRN [id number]  Date of birth: 06-28-1976  Subjective No chief complaint on file.   HPI Tyler Gross is a 45 y.o. male here today with complaint of sore along the R abdominal wall.  This appeared a few weeks ago.  Started as a raised bump/boil that he squeezed and popped.  Some healing since then but still with open wound.  He denies increased swelling, drainage, pain or fever/chills.  He has tried neosporin and a bandaid to this area.  . ROS:  A comprehensive ROS was completed and negative except as noted per HPI  Allergies  Allergen Reactions  . Atorvastatin Other (See Comments)    Muscle fatigue  . Bee Venom Swelling  . Sulfamethoxazole-Trimethoprim Hives    Bactrim     Past Medical History:  Diagnosis Date  . Acne    on back taking doxycycline for  . Alcoholism ([place])    sober since 04/2018  . Anxiety   . Depression   . DM type 2 (diabetes mellitus, type 2) ([place])   . ED (erectile dysfunction)   . GAD (generalized anxiety disorder)   . GERD (gastroesophageal reflux disease)   . Gynecomastia, male    followed by dr Shellia Cleverly  . High serum estradiol    resolved  . History of anal fissures   . History of kidney stones   . History of small bowel obstruction 2004  . Hypertension   . IBS (irritable bowel syndrome)   . Mixed hyperlipidemia   . OSA on CPAP    per last study 07-31-2013  severe osa  . Primary hypogonadism in male    endocrinologist-  dr Shellia Cleverly    Past Surgical History:  Procedure Laterality Date  . CARDIOVASCULAR STRESS TEST  10/05/2009   normal nuclear study w/ no ischemia/  normal LV function and wall motion , ef 71%  . COLONOSCOPY  last one   2004, april 2021  . EVALUATION UNDER ANESTHESIA WITH ANAL FISTULECTOMY N/A 02/13/2018   Procedure: ANAL EXAM UNDER ANESTHESIA WITH BIOPSY;  Surgeon: [name], MD;  Location: [facility];  Service: General;  Laterality: N/A;  . ORCHIECTOMY Left 1991   w/  placement prosthesis (for torsion)  . TESTICULAR EXPLORATION Left 01/29/2020   Procedure: EXCHANGE OF LEFT TESTICULAR PROSTHESIS;  Surgeon: Cleon Gustin, MD;  Location: [facility];  Service: Urology;  Laterality: Left;  . URETEROLITHOTOMY  1999    Social History   Socioeconomic History  . Marital status: Soil scientist    Spouse name: Not on file  . Number of children: Not on file  . Years of education: Not on file  . Highest education level: Not on file  Occupational History  . Occupation: Therapist, sports   Tobacco Use  . Smoking status: Former Smoker    Packs/day: 1.00    Years: 10.00    Pack years: 10.00    Types: Cigarettes    Quit date: 02/07/2009    Years since quitting: 11.8  . Smokeless tobacco: Never Used  Vaping Use  . Vaping Use: Never used  Substance and Sexual Activity  . Alcohol use: Not Currently    Comment: sober 04/09/2018  . Drug use: No  . Sexual activity: Yes    Partners: Male  Other Topics Concern  . Not on file  Social History Narrative   Lives with partner   Regular exercise: no   Caffeine use: 1 large cup of  coffee daily; 2 to 3 sodas in the evening   Social Determinants of Health   Financial Resource Strain: Not on file  Food Insecurity: Not on file  Transportation Needs: Not on file  Physical Activity: Not on file  Stress: Not on file  Social Connections: Not on file    Family History  Problem Relation Age of Onset  . Cancer Mother        breast cancer: stage I  . Hypertension Mother   . Irritable bowel syndrome Mother   . Heart disease Maternal Grandfather   . Diabetes Paternal Grandmother   . Colon cancer Maternal Aunt        great aunt  . Esophageal cancer Neg Hx   . Rectal cancer Neg Hx   . Stomach cancer Neg Hx     Health Maintenance  Topic Date Due  . OPHTHALMOLOGY EXAM  Never done  . Hepatitis C Screening  Never done  . COVID-19 Vaccine (3 - Booster) 01/22/2020  . INFLUENZA VACCINE  03/06/2021  .  HEMOGLOBIN A1C  05/27/2021  . FOOT EXAM  11/25/2021  . TETANUS/TDAP  01/27/2024  . COLONOSCOPY (Pts 45-37yrs Insurance coverage will need to be confirmed)  10/07/2029  . PNEUMOCOCCAL POLYSACCHARIDE VACCINE AGE 69-64 HIGH RISK  Completed  . HIV Screening  Completed  . HPV VACCINES  Aged Out     ----------------------------------------------------------------------------------------------------------------------------------------------------------------------------------------------------------------- Physical Exam BP (!) 137/94 (BP Location: Left Arm, Patient Position: Sitting, Cuff Size: Large)   Pulse 84   Temp (!) 97.3 F (36.3 C)   Ht 6' (1.829 m)   Wt 276 lb 4.8 oz (125.3 kg)   SpO2 95%   BMI 37.47 kg/m   Physical Exam Constitutional:      Appearance: Normal appearance.  Skin:    Comments: ~0.5cm ulcerated area along R abdominal wall.  There is minimal induration without fluctuance or significant tenderness.  No drainage from wound.   Neurological:     Mental Status: He is alert.  Psychiatric:        Mood and Affect: Mood normal.        Behavior: Behavior normal.     ------------------------------------------------------------------------------------------------------------------------------------------------------------------------------------------------------------------- Assessment and Plan  Open wound of abdominal wall Small wound to abdominal wall.  Wound bed looks dry which may be delaying healing.  Does not appear infected at this time. Recommend trying vaseline over area and continuing to cover.  If not seeing improvement in 7-10 days we may need to consider punch biopsy or dermatology referral.      No orders of the defined types were placed in this encounter.   No follow-ups on file.    This visit occurred during the SARS-CoV-2 public health emergency.  Safety protocols were in place, including screening questions prior to the visit, additional usage  of staff PPE, and extensive cleaning of exam room while observing appropriate contact time as indicated for disinfecting solutions.

## 2020-12-28 NOTE — Assessment & Plan Note (Signed)
Small wound to abdominal wall.  Wound bed looks dry which may be delaying healing.  Does not appear infected at this time. Recommend trying vaseline over area and continuing to cover.  If not seeing improvement in 7-10 days we may need to consider punch biopsy or dermatology referral.

## 2021-01-03 ENCOUNTER — Other Ambulatory Visit (HOSPITAL_BASED_OUTPATIENT_CLINIC_OR_DEPARTMENT_OTHER): Payer: Self-pay

## 2021-01-05 ENCOUNTER — Encounter: Payer: Self-pay | Admitting: Family Medicine

## 2021-01-06 ENCOUNTER — Other Ambulatory Visit: Payer: Self-pay | Admitting: Family Medicine

## 2021-01-06 DIAGNOSIS — Z111 Encounter for screening for respiratory tuberculosis: Secondary | ICD-10-CM

## 2021-01-06 NOTE — Telephone Encounter (Signed)
Orders entered for quantiferon

## 2021-01-16 DIAGNOSIS — Z111 Encounter for screening for respiratory tuberculosis: Secondary | ICD-10-CM | POA: Diagnosis not present

## 2021-01-17 NOTE — Telephone Encounter (Signed)
PER DR H 6-13 @6 :07 PM: There was no evidence of calcium which would not be affected by motion or contrast and so that is reassuring. In addition the reading MD thought that the study was interpretable even with some motion artifact.     Pt informed of providers result & recommendations. Pt verbalized understanding. No further questions .

## 2021-01-18 LAB — QUANTIFERON-TB GOLD PLUS
Mitogen-NIL: >10 IU/mL
NIL: 0.02 IU/mL
QuantiFERON-TB Gold Plus: NEGATIVE
TB1-NIL: 0.01 IU/mL
TB2-NIL: 0 IU/mL

## 2021-01-23 DIAGNOSIS — F339 Major depressive disorder, recurrent, unspecified: Secondary | ICD-10-CM | POA: Diagnosis not present

## 2021-01-23 DIAGNOSIS — F411 Generalized anxiety disorder: Secondary | ICD-10-CM | POA: Diagnosis not present

## 2021-01-23 DIAGNOSIS — F4010 Social phobia, unspecified: Secondary | ICD-10-CM | POA: Diagnosis not present

## 2021-01-23 DIAGNOSIS — F410 Panic disorder [episodic paroxysmal anxiety] without agoraphobia: Secondary | ICD-10-CM | POA: Diagnosis not present

## 2021-01-23 DIAGNOSIS — F4310 Post-traumatic stress disorder, unspecified: Secondary | ICD-10-CM | POA: Diagnosis not present

## 2021-01-24 ENCOUNTER — Other Ambulatory Visit (INDEPENDENT_AMBULATORY_CARE_PROVIDER_SITE_OTHER): Payer: 59

## 2021-01-24 ENCOUNTER — Telehealth: Payer: Self-pay | Admitting: Gastroenterology

## 2021-01-24 ENCOUNTER — Telehealth: Payer: Self-pay

## 2021-01-24 ENCOUNTER — Other Ambulatory Visit: Payer: Self-pay | Admitting: Gastroenterology

## 2021-01-24 DIAGNOSIS — R197 Diarrhea, unspecified: Secondary | ICD-10-CM

## 2021-01-24 LAB — HIGH SENSITIVITY CRP: CRP, High Sensitivity: 16.23 mg/L — ABNORMAL HIGH (ref 0.000–5.000)

## 2021-01-24 LAB — CBC
HCT: 44.9 % (ref 39.0–52.0)
Hemoglobin: 15.2 g/dL (ref 13.0–17.0)
MCHC: 33.8 g/dL (ref 30.0–36.0)
MCV: 84.1 fl (ref 78.0–100.0)
Platelets: 311 10*3/uL (ref 150.0–400.0)
RBC: 5.33 Mil/uL (ref 4.22–5.81)
RDW: 15 % (ref 11.5–15.5)
WBC: 10 10*3/uL (ref 4.0–10.5)

## 2021-01-24 LAB — COMPREHENSIVE METABOLIC PANEL
ALT: 41 U/L (ref 0–53)
AST: 38 U/L — ABNORMAL HIGH (ref 0–37)
Albumin: 4.8 g/dL (ref 3.5–5.2)
Alkaline Phosphatase: 69 U/L (ref 39–117)
BUN: 13 mg/dL (ref 6–23)
CO2: 23 mEq/L (ref 19–32)
Calcium: 9.4 mg/dL (ref 8.4–10.5)
Chloride: 101 mEq/L (ref 96–112)
Creatinine, Ser: 0.97 mg/dL (ref 0.40–1.50)
GFR: 93.83 mL/min (ref >60.00)
Glucose, Bld: 158 mg/dL — ABNORMAL HIGH (ref 70–99)
Potassium: 3.5 mEq/L (ref 3.5–5.1)
Sodium: 137 mEq/L (ref 135–145)
Total Bilirubin: 0.8 mg/dL (ref 0.2–1.2)
Total Protein: 7.7 g/dL (ref 6.0–8.3)

## 2021-01-24 LAB — SEDIMENTATION RATE: Sed Rate: 23 mm/hr — ABNORMAL HIGH (ref 0–15)

## 2021-01-24 NOTE — Telephone Encounter (Signed)
Spoke to patient to inform him of Dr Donneta Romberg recommendations. He will go to the [facility] lab today for stool studies and blood work. He will continue imodium 4 mg in the AM and use additional 2 mg not to exceed 12 mg. We will contact patient with results once reviewed. All questions answered. Patient voiced understanding.

## 2021-01-24 NOTE — Telephone Encounter (Signed)
Patient called states he has been having a lot of diarrhea and had to call out of work today seeking advise.

## 2021-01-24 NOTE — Telephone Encounter (Signed)
KT, Reviewed patient's chart.  Has had more issues with constipation recently.  Certainly in the setting of a history of microscopic colitis it is possible he may require treatment although it looks like he has not required treatment in many years. I would recommend the following: 1) stool studies-GI pathogen panel, C. difficile, ova and parasite, pancreatic fecal elastase 2) blood tests-CBC/CMP/ESR/CRP 3) after stool tests have returned, may consider budesonide therapy 4) in the interim Imodium 4 mg in a.m. and use an additional 2 mg (total Imodium use per day no more than 12 mg) 5) if patient still having symptoms after stool tests have been given and patient has used Imodium will likely send Lomotil  Thanks. GM

## 2021-01-24 NOTE — Telephone Encounter (Signed)
error 

## 2021-01-24 NOTE — Telephone Encounter (Signed)
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 No fever nausea or vomiting,no blood noted in stool. He is able to stay adequately hydrated,food intake is poor. He is having nocturnal stools. Has not been on any antibiotics,started Ozempic 2 months ago.He has been taking Imodium AD with no relief. Patient has been out of work due to his symptoms. Dr Rush Landmark please advise for further treatment.

## 2021-01-25 ENCOUNTER — Ambulatory Visit: Payer: 59 | Admitting: Family Medicine

## 2021-01-25 NOTE — Telephone Encounter (Signed)
Inbound call from pt stating that he is still having diarrhea and the imodium that he had taken isn't working. Please advise. Thank you.

## 2021-01-25 NOTE — Telephone Encounter (Signed)
Spoke with patient, he states that his symptoms are unchanged despite the Imodium. He reports having 5 loose stools so far today. No blood. He reports waves of nausea and continues to have abdominal cramping at the midline. He is aware that it is going to take a couple of days for [place] to get the stool results back. Patient states that he has taking up to 12 mg of Imodium wondering if there is something that can be called in for him. Patient has confirmed pharmacy on file. Please advise, thanks.

## 2021-01-25 NOTE — Telephone Encounter (Signed)
Inbound call from patient. States he gave stool sample 6/21. States still having cramping, diarrhea, and waves of nausea. Says he have taken over 12 mg of imodium and it is not helping at all. Call back number [phone]

## 2021-01-26 MED ORDER — DIPHENOXYLATE-ATROPINE 2.5-0.025 MG PO TABS: 1.0000 | ORAL_TABLET | Freq: Four times a day (QID) | ORAL | 0 refills | Status: DC | PRN

## 2021-01-26 NOTE — Telephone Encounter (Signed)
Spoke with patient in regards to recommendations. Pt is aware that we are still awaiting stool study results but we will contact him once those have been received and reviewed. Patient verbalized understanding and had no concerns at the end of the call.

## 2021-01-26 NOTE — Telephone Encounter (Signed)
[place], I have ordered the patient Lomotil. He may use up to 4 times daily. None of his stool studies have returned as of yet, so please let him know that. Thanks. GM

## 2021-01-27 ENCOUNTER — Telehealth: Payer: Self-pay | Admitting: Gastroenterology

## 2021-01-27 LAB — GI PROFILE, STOOL, PCR

## 2021-01-27 NOTE — Telephone Encounter (Signed)
Spoke with patient, advised that Dr. Loletha Grayer will return on Monday and will advise on letter at that time. Patient is very overwhelmed because he does not know what is going on or causing the diarrhea. Patient has been advised to continue Lomotil as directed. Advised patient that he can also try a clear liquid diet for a couple of days to let his stomach settle and then can slowly advance to a bland diet to see how he does. Patient was very thankful for all of my help and recommendations. Patient is aware that we will contact him in regards to letter. Patient verbalized understanding and had no further concerns.

## 2021-01-27 NOTE — Telephone Encounter (Signed)
Pt has not been able to go to work because of his GI issues. He is requesting work Geographical information systems officer. He missed work the following dates: June 21, 22, and 24. He can pick up letter at the office or we can send it via [place].

## 2021-01-29 LAB — CLOSTRIDIUM DIFFICILE TOXIN B, QUALITATIVE, REAL-TIME PCR: Toxigenic C. Difficile by PCR: NOT DETECTED

## 2021-01-29 LAB — OVA AND PARASITE EXAMINATION
CONCENTRATE RESULT:: NONE SEEN
MICRO NUMBER:: 12032130
SPECIMEN QUALITY:: ADEQUATE
TRICHROME RESULT:: NONE SEEN

## 2021-01-29 LAB — PANCREATIC ELASTASE, FECAL: Pancreatic Elastase-1, Stool: 192 mcg/g — ABNORMAL LOW

## 2021-01-30 NOTE — Telephone Encounter (Addendum)
Spoke with patient in regards to further recommendations. He states that he is not on an antispasmodic but is actually doing way better and having normal BM's. Patient would like to wait on doing SIBO test at this time, he wants to monitor symptoms. If symptoms return then he will consider proceeding with SIBO breat test. Patient would like letter sent to him via My Chart. Patient has been scheduled for a follow up with Dr. Bryan Lemma on Monday, 02/27/21 at 11:20 am. Patient verbalized understanding of all information and had no concerns at the end of the call.

## 2021-02-02 MED ORDER — PANCRELIPASE (LIP-PROT-AMYL) 36000-114000 UNITS PO CPEP: ORAL_CAPSULE | ORAL | 0 refills | Status: DC

## 2021-02-02 NOTE — Telephone Encounter (Signed)
Prescription sent to pharmacy on file. 

## 2021-02-12 ENCOUNTER — Other Ambulatory Visit: Payer: Self-pay | Admitting: Urology

## 2021-02-14 ENCOUNTER — Other Ambulatory Visit: Payer: Self-pay

## 2021-02-14 ENCOUNTER — Encounter: Payer: Self-pay | Admitting: Family Medicine

## 2021-02-14 ENCOUNTER — Ambulatory Visit (INDEPENDENT_AMBULATORY_CARE_PROVIDER_SITE_OTHER): Payer: 59 | Admitting: Family Medicine

## 2021-02-14 ENCOUNTER — Other Ambulatory Visit (HOSPITAL_BASED_OUTPATIENT_CLINIC_OR_DEPARTMENT_OTHER): Payer: Self-pay

## 2021-02-14 ENCOUNTER — Other Ambulatory Visit (HOSPITAL_COMMUNITY): Payer: Self-pay

## 2021-02-14 VITALS — BP 138/87 | HR 91 | Temp 97.8°F | Ht 72.0 in | Wt 276.0 lb

## 2021-02-14 DIAGNOSIS — E119 Type 2 diabetes mellitus without complications: Secondary | ICD-10-CM

## 2021-02-14 DIAGNOSIS — Z0000 Encounter for general adult medical examination without abnormal findings: Secondary | ICD-10-CM | POA: Diagnosis not present

## 2021-02-14 DIAGNOSIS — E781 Pure hyperglyceridemia: Secondary | ICD-10-CM | POA: Diagnosis not present

## 2021-02-14 DIAGNOSIS — I10 Essential (primary) hypertension: Secondary | ICD-10-CM | POA: Diagnosis not present

## 2021-02-14 MED ORDER — AMLODIPINE BESYLATE 10 MG PO TABS: 10.0000 mg | ORAL_TABLET | Freq: Every morning | ORAL | 2 refills | Status: DC

## 2021-02-14 MED ORDER — SEMAGLUTIDE (1 MG/DOSE) 4 MG/3ML ~~LOC~~ SOPN: 1.0000 mg | PEN_INJECTOR | SUBCUTANEOUS | 3 refills | Status: DC

## 2021-02-14 MED ORDER — SEMAGLUTIDE (1 MG/DOSE) 4 MG/3ML ~~LOC~~ SOPN
1.0000 mg | PEN_INJECTOR | SUBCUTANEOUS | 3 refills | Status: DC
  Filled 2021-02-14 (×2): qty 3, 28d supply, fill #0
  Filled 2021-03-30: qty 9, 84d supply, fill #1

## 2021-02-14 NOTE — Patient Instructions (Signed)
Preventive Care 40-46 Years Old, Male Preventive care refers to lifestyle choices and visits with your health care provider that can promote health and wellness. This includes: A yearly physical exam. This is also called an annual wellness visit. Regular dental and eye exams. Immunizations. Screening for certain conditions. Healthy lifestyle choices, such as: Eating a healthy diet. Getting regular exercise. Not using drugs or products that contain nicotine and tobacco. Limiting alcohol use. What can I expect for my preventive care visit? Physical exam Your health care provider will check your: Height and weight. These may be used to calculate your BMI (body mass index). BMI is a measurement that tells if you are at a healthy weight. Heart rate and blood pressure. Body temperature. Skin for abnormal spots. Counseling Your health care provider may ask you questions about your: Past medical problems. Family's medical history. Alcohol, tobacco, and drug use. Emotional well-being. Home life and relationship well-being. Sexual activity. Diet, exercise, and sleep habits. Work and work environment. Access to firearms. What immunizations do I need?  Vaccines are usually given at various ages, according to a schedule. Your health care provider will recommend vaccines for you based on your age, medicalhistory, and lifestyle or other factors, such as travel or where you work. What tests do I need? Blood tests Lipid and cholesterol levels. These may be checked every 5 years, or more often if you are over 50 years old. Hepatitis C test. Hepatitis B test. Screening Lung cancer screening. You may have this screening every year starting at age 55 if you have a 30-pack-year history of smoking and currently smoke or have quit within the past 15 years. Prostate cancer screening. Recommendations will vary depending on your family history and other risks. Genital exam to check for testicular cancer  or hernias. Colorectal cancer screening. All adults should have this screening starting at age 50 and continuing until age 75. Your health care provider may recommend screening at age 45 if you are at increased risk. You will have tests every 1-10 years, depending on your results and the type of screening test. Diabetes screening. This is done by checking your blood sugar (glucose) after you have not eaten for a while (fasting). You may have this done every 1-3 years. STD (sexually transmitted disease) testing, if you are at risk. Follow these instructions at home: Eating and drinking  Eat a diet that includes fresh fruits and vegetables, whole grains, lean protein, and low-fat dairy products. Take vitamin and mineral supplements as recommended by your health care provider. Do not drink alcohol if your health care provider tells you not to drink. If you drink alcohol: Limit how much you have to 0-2 drinks a day. Be aware of how much alcohol is in your drink. In the [place] one drink equals one 12 oz bottle of beer (355 mL), one 5 oz glass of wine (148 mL), or one 1 oz glass of hard liquor (44 mL).  Lifestyle Take daily care of your teeth and gums. Brush your teeth every morning and night with fluoride toothpaste. Floss one time each day. Stay active. Exercise for at least 30 minutes 5 or more days each week. Do not use any products that contain nicotine or tobacco, such as cigarettes, e-cigarettes, and chewing tobacco. If you need help quitting, ask your health care provider. Do not use drugs. If you are sexually active, practice safe sex. Use a condom or other form of protection to prevent STIs (sexually transmitted infections). If told by   your health care provider, take low-dose aspirin daily starting at age 50. Find healthy ways to cope with stress, such as: Meditation, yoga, or listening to music. Journaling. Talking to a trusted person. Spending time with friends and  family. Safety Always wear your seat belt while driving or riding in a vehicle. Do not drive: If you have been drinking alcohol. Do not ride with someone who has been drinking. When you are tired or distracted. While texting. Wear a helmet and other protective equipment during sports activities. If you have firearms in your house, make sure you follow all gun safety procedures. What's next? Go to your health care provider once a year for an annual wellness visit. Ask your health care provider how often you should have your eyes and teeth checked. Stay up to date on all vaccines. This information is not intended to replace advice given to you by your health care provider. Make sure you discuss any questions you have with your healthcare provider. Document Revised: 04/21/2019 Document Reviewed: 07/17/2018 Elsevier Patient Education  2022 [employer]  

## 2021-02-14 NOTE — Assessment & Plan Note (Signed)
Well adult Orders Placed This Encounter  Procedures  . HgB A1c  . Lipid Panel w/reflex Direct LDL  Screening: Up-to-date Immunizations: Up-to-date Anticipatory guidance/risk factor reduction: Recommendations per AVS

## 2021-02-14 NOTE — Progress Notes (Signed)
Tyler Gross - 45 y.o. male MRN [id number]  Date of birth: 07-Jan-1976  Subjective Chief Complaint  Patient presents with   Annual Exam    HPI Tyler Gross is a 45 year old male here today for annual exam.  States he is doing well at this time.  He is planning on pursuing bariatric surgery over the next few months.  He does have comorbidities of hypertension, hyperlipidemia as well as type 2 diabetes.  Recently seen by GI for increased diarrhea.  Had fecal elastase test which was borderline.  Provided trial of Creon for possible pancreatic insufficiency however he has not started this due to improvement of symptoms.  He does admit that diet has not been great however starting to make some changes to help with this.  He does not exercise regularly.  He has remote history of smoking and has had very limited use of alcohol over the past couple years.  He is up-to-date on colon cancer screening.    He is currently in school for DNP degree.  Review of Systems  Constitutional:  Negative for chills, fever, malaise/fatigue and weight loss.  HENT:  Negative for congestion, ear pain and sore throat.   Eyes:  Negative for blurred vision, double vision and pain.  Respiratory:  Negative for cough and shortness of breath.   Cardiovascular:  Negative for chest pain and palpitations.  Gastrointestinal:  Negative for abdominal pain, blood in stool, constipation, heartburn and nausea.  Genitourinary:  Negative for dysuria and urgency.  Musculoskeletal:  Negative for joint pain and myalgias.  Neurological:  Negative for dizziness and headaches.  Endo/Heme/Allergies:  Does not bruise/bleed easily.  Psychiatric/Behavioral:  Negative for depression. The patient is not nervous/anxious and does not have insomnia.    Allergies  Allergen Reactions   Atorvastatin Other (See Comments)    Muscle fatigue   Bee Venom Swelling   Sulfamethoxazole-Trimethoprim Hives    Bactrim     Past Medical History:   Diagnosis Date   Acne    on back taking doxycycline for   Alcoholism [facility])    sober since 04/2018   Anxiety    Depression    DM type 2 (diabetes mellitus, type 2) ([place])    ED (erectile dysfunction)    GAD (generalized anxiety disorder)    GERD (gastroesophageal reflux disease)    Gynecomastia, male    followed by dr Shellia Cleverly   High serum estradiol    resolved   History of anal fissures    History of kidney stones    History of small bowel obstruction 2004   Hypertension    IBS (irritable bowel syndrome)    Mixed hyperlipidemia    OSA on CPAP    per last study 07-31-2013  severe osa   Primary hypogonadism in male    endocrinologist-  dr Shellia Cleverly    Past Surgical History:  Procedure Laterality Date   CARDIOVASCULAR STRESS TEST  10/05/2009   normal nuclear study w/ no ischemia/  normal LV function and wall motion , ef 71%   COLONOSCOPY  last one   2004, april 2021   EVALUATION UNDER ANESTHESIA WITH ANAL FISTULECTOMY N/A 02/13/2018   Procedure: ANAL EXAM UNDER ANESTHESIA WITH BIOPSY;  Surgeon: [name], MD;  Location: [place];  Service: General;  Laterality: N/A;   ORCHIECTOMY Left 1991   w/ placement prosthesis (for torsion)   TESTICULAR EXPLORATION Left 01/29/2020   Procedure: EXCHANGE OF LEFT TESTICULAR PROSTHESIS;  Surgeon: Cleon Gustin, MD;  Location: [place];  Service: Urology;  Laterality: Left;   URETEROLITHOTOMY  1999    Social History   Socioeconomic History   Marital status: Soil scientist    Spouse name: Not on file   Number of children: Not on file   Years of education: Not on file   Highest education level: Not on file  Occupational History   Occupation: RN   Tobacco Use   Smoking status: Former    Packs/day: 1.00    Years: 10.00    Pack years: 10.00    Types: Cigarettes    Quit date: 02/07/2009    Years since quitting: 12.0   Smokeless tobacco: Never  Vaping Use   Vaping Use: Never used  Substance  and Sexual Activity   Alcohol use: Not Currently    Comment: sober 04/09/2018   Drug use: No   Sexual activity: Yes    Partners: Male  Other Topics Concern   Not on file  Social History Narrative   Lives with partner   Regular exercise: no   Caffeine use: 1 large cup of coffee daily; 2 to 3 sodas in the evening   Social Determinants of Health   Financial Resource Strain: Not on file  Food Insecurity: Not on file  Transportation Needs: Not on file  Physical Activity: Not on file  Stress: Not on file  Social Connections: Not on file    Family History  Problem Relation Age of Onset   Cancer Mother        breast cancer: stage I   Hypertension Mother    Irritable bowel syndrome Mother    Heart disease Maternal Grandfather    Diabetes Paternal Grandmother    Colon cancer Maternal Aunt        great aunt   Esophageal cancer Neg Hx    Rectal cancer Neg Hx    Stomach cancer Neg Hx     Health Maintenance  Topic Date Due   OPHTHALMOLOGY EXAM  Never done   Hepatitis C Screening  Never done   COVID-19 Vaccine (3 - Mixed Product risk series) 09/21/2019   Pneumococcal Vaccine 35-89 Years old (2 - PCV) 02/17/2020   INFLUENZA VACCINE  03/06/2021   HEMOGLOBIN A1C  05/27/2021   FOOT EXAM  11/25/2021   TETANUS/TDAP  01/27/2024   COLONOSCOPY (Pts 45-87yrs Insurance coverage will need to be confirmed)  10/07/2029   PNEUMOCOCCAL POLYSACCHARIDE VACCINE AGE 67-64 HIGH RISK  Completed   HIV Screening  Completed   HPV VACCINES  Aged Out     ----------------------------------------------------------------------------------------------------------------------------------------------------------------------------------------------------------------- Physical Exam BP 138/87 (BP Location: Left Arm, Patient Position: Sitting, Cuff Size: Large)   Pulse 91   Temp 97.8 F (36.6 C)   Ht 6' (1.829 m)   Wt 276 lb (125.2 kg)   SpO2 100%   BMI 37.43 kg/m   Physical Exam Constitutional:       General: He is not in acute distress. HENT:     Head: Normocephalic and atraumatic.     Right Ear: Tympanic membrane and external ear normal.     Left Ear: Tympanic membrane and external ear normal.  Eyes:     General: No scleral icterus. Neck:     Thyroid: No thyromegaly.  Cardiovascular:     Rate and Rhythm: Normal rate and regular rhythm.     Heart sounds: Normal heart sounds.  Pulmonary:     Effort: Pulmonary effort is normal.     Breath sounds: Normal breath  sounds.  Abdominal:     General: Bowel sounds are normal. There is no distension.     Palpations: Abdomen is soft.     Tenderness: There is no abdominal tenderness. There is no guarding.  Musculoskeletal:     Cervical back: Normal range of motion.  Lymphadenopathy:     Cervical: No cervical adenopathy.  Skin:    General: Skin is warm and dry.     Findings: No rash.  Neurological:     Mental Status: He is alert and oriented to person, place, and time.     Cranial Nerves: No cranial nerve deficit.     Motor: No abnormal muscle tone.  Psychiatric:        Mood and Affect: Mood normal.        Behavior: Behavior normal.    ------------------------------------------------------------------------------------------------------------------------------------------------------------------------------------------------------------------- Assessment and Plan  No problem-specific Assessment & Plan notes found for this encounter.   Meds ordered this encounter  Medications   amLODipine (NORVASC) 10 MG tablet    Sig: Take 1 tablet (10 mg total) by mouth every morning.    Dispense:  90 tablet    Refill:  2   DISCONTD: Semaglutide, 1 MG/DOSE, 4 MG/3ML SOPN    Sig: Inject 1 mg as directed once a week.    Dispense:  3 mL    Refill:  3   Semaglutide, 1 MG/DOSE, 4 MG/3ML SOPN    Sig: Inject 1 mg as directed once a week.    Dispense:  3 mL    Refill:  3    No follow-ups on file.    This visit occurred during the  SARS-CoV-2 public health emergency.  Safety protocols were in place, including screening questions prior to the visit, additional usage of staff PPE, and extensive cleaning of exam room while observing appropriate contact time as indicated for disinfecting solutions.

## 2021-02-15 LAB — LIPID PANEL W/REFLEX DIRECT LDL
Cholesterol: 154 mg/dL (ref <200)
HDL: 44 mg/dL (ref >=40)
LDL Cholesterol (Calc): 77 mg/dL (calc)
Non-HDL Cholesterol (Calc): 110 mg/dL (calc) (ref <130)
Total CHOL/HDL Ratio: 3.5 (calc) (ref <5.0)
Triglycerides: 248 mg/dL — ABNORMAL HIGH (ref <150)

## 2021-02-15 LAB — HEMOGLOBIN A1C
Hgb A1c MFr Bld: 7.7 % of total Hgb — ABNORMAL HIGH (ref <5.7)
Mean Plasma Glucose: 174 mg/dL
eAG (mmol/L): 9.7 mmol/L

## 2021-02-16 ENCOUNTER — Other Ambulatory Visit (HOSPITAL_COMMUNITY): Payer: Self-pay

## 2021-02-22 ENCOUNTER — Other Ambulatory Visit (HOSPITAL_BASED_OUTPATIENT_CLINIC_OR_DEPARTMENT_OTHER): Payer: Self-pay

## 2021-02-27 ENCOUNTER — Ambulatory Visit (INDEPENDENT_AMBULATORY_CARE_PROVIDER_SITE_OTHER): Payer: 59 | Admitting: Gastroenterology

## 2021-02-27 ENCOUNTER — Encounter: Payer: Self-pay | Admitting: Gastroenterology

## 2021-02-27 ENCOUNTER — Other Ambulatory Visit: Payer: Self-pay

## 2021-02-27 VITALS — BP 142/84 | HR 95 | Ht 72.0 in | Wt 275.5 lb

## 2021-02-27 DIAGNOSIS — K2990 Gastroduodenitis, unspecified, without bleeding: Secondary | ICD-10-CM | POA: Diagnosis not present

## 2021-02-27 DIAGNOSIS — R1310 Dysphagia, unspecified: Secondary | ICD-10-CM

## 2021-02-27 DIAGNOSIS — K2970 Gastritis, unspecified, without bleeding: Secondary | ICD-10-CM

## 2021-02-27 DIAGNOSIS — K219 Gastro-esophageal reflux disease without esophagitis: Secondary | ICD-10-CM | POA: Diagnosis not present

## 2021-02-27 DIAGNOSIS — K582 Mixed irritable bowel syndrome: Secondary | ICD-10-CM

## 2021-02-27 MED ORDER — PANTOPRAZOLE SODIUM 40 MG PO TBEC: 40.0000 mg | DELAYED_RELEASE_TABLET | Freq: Two times a day (BID) | ORAL | 5 refills | Status: DC

## 2021-02-27 NOTE — Progress Notes (Signed)
Chief Complaint:    Diarrhea, discussed medications  GI History: 45 y.o. male with a history of GAD, diabetes, IBS, OSA (on CPAP), hypertension, follows in GI clinic for the following:  1) IBS - Symptoms started in 2000 10/2002.  Interestingly, colonoscopy at that time in [place], [place] with suspicion for Crohn's Disease.  Treated with Remicade (only 3 infusions) and steroids.  Symptoms improved, and followed up with a different GI, and was felt this was not Crohn's Disease and was diagnosed with Microscopic Colitis on a second colonoscopy.  Symptoms eventually resolved. - Can also have episodic constipation, treated with fiber - 01/2021: Acute exacerbation of diarrhea.  No relief with Imodium.  Normal CBC, stool studies negative/normal, elevated ESR/CRP (23/16).  Fecal elastase 192.  Trialed Lomotil and fiber supplement with good clinical response. Hadnt started Creon that was prescribed.   2) Anal fissure - EUA by Dr. Marcello Moores in 02/2018 n/f anal fissure. 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  Was treated with topical nifedipine with lidocaine with clinical improvement.  3) Internal hemorrhoids - Completed hemorrhoid banding series in 2021 with resolution of symptoms  4) GERD with erosive esophagitis, EOE - Well-controlled with Protonix  Endoscopic History: -Colonoscopy (10/08/2019, Dr. Bryan Lemma): Sigmoid diverticulosis, internal hemorrhoids, otherwise normal with normal TI.  Repeat 10 years -EGD (10/08/2019, Dr. Bryan Lemma): Longitudinal furrows, felinization (biopsy with 20 eos lower/15 upper per hpf.  Favor  EoE based on endoscopic appearance), empiric 54 Fr Maloney dilation, LA Grade B esophagitis, non-H. pylori gastritis.  Repeat EGD in 8 weeks  HPI:     Patient is a 45 y.o. male presenting to the Gastroenterology Clinic for follow-up.  Recent onset watery, nonbloody diarrhea starting in June.  Extensive  evaluation as above notable for elevated ESR/CRP and mildly reduced fecal pancreatic elastase.  Was started on Lomotil and added Creon (never started) along with fiber supplement.  Today, he states symptoms have much improved. No longer taking Lomotil. Back to baseline for the last 2-3 weeks or so. Good PO intake. No fever. Back to working without issue again.   Fecal elastase was 192, just below the cutoff for normal (200) and prescribed a course of Creon, but he never started this as his symptoms were improving.   Is scheduled for bariatric surgery (gastric sleeve) with Dr. Hassell Done in Sept.   UGI sxs well controlled with Protonix 40 mg BID.     Review of systems:     No chest pain, no SOB, no fevers, no urinary sx   Past Medical History:  Diagnosis Date   Acne    on back taking doxycycline for   Alcoholism [facility])    sober since 04/2018   Anxiety    Depression    DM type 2 (diabetes mellitus, type 2) ([place])    ED (erectile dysfunction)    GAD (generalized anxiety disorder)    GERD (gastroesophageal reflux disease)    Gynecomastia, male    followed by dr Shellia Cleverly   High serum estradiol    resolved   History of anal fissures    History of kidney stones    History of small bowel obstruction 2004   Hypertension    IBS (irritable bowel syndrome)    Mixed hyperlipidemia    OSA on CPAP    per last study 07-31-2013  severe osa   Primary hypogonadism in male    endocrinologist-  dr Shellia Cleverly    Patient's surgical history,  family medical history, social history, medications and allergies were all reviewed in Epic    Current Outpatient Medications  Medication Sig Dispense Refill   amLODipine (NORVASC) 10 MG tablet Take 1 tablet (10 mg total) by mouth every morning. 90 tablet 2   anastrozole (ARIMIDEX) 1 MG tablet TAKE ONE TABLET BY MOUTH DAILY 90 tablet 3   atenolol (TENORMIN) 100 MG tablet Take 1 tablet (100 mg total) by mouth every morning. 90 tablet 1   Calcium Carbonate-Vitamin D  (CALCIUM-VITAMIN D) 600-125 MG-UNIT TABS 1 tablet daily.     clomiPHENE (CLOMID) 50 MG tablet TAKE 1/2 TABLET BY MOUTH EVERY OTHER DAY 18 tablet 3   DULoxetine (CYMBALTA) 60 MG capsule Take 1 capsule (60 mg total) by mouth daily. For mood control 30 capsule 0   gabapentin (NEURONTIN) 600 MG tablet Take 600 mg by mouth 2 (two) times daily.      icosapent Ethyl (VASCEPA) 1 g capsule Take 2 capsules (2 g total) by mouth 2 (two) times daily. 360 capsule 2   lipase/protease/amylase (CREON) 36000 UNITS CPEP capsule Take 2 capsules (72,000 Units total) by mouth 3 (three) times daily before meals AND 1 capsule (36,000 Units total) with snacks. 300 capsule 0   lisinopril-hydrochlorothiazide (ZESTORETIC) 20-25 MG tablet Take 1 tablet by mouth daily. 90 tablet 2   metformin (FORTAMET) 1000 MG (OSM) 24 hr tablet 500 mg 2 (two) times daily.     Multiple Vitamin (MULTIVITAMIN WITH MINERALS) TABS tablet Take 1 tablet by mouth daily.     Omega-3 Fatty Acids (FISH OIL) 1000 MG CPDR Take by mouth.     OVER THE COUNTER MEDICATION Cranberry 1 daily     OVER THE COUNTER MEDICATION Vitamin b 12 daily     pantoprazole (PROTONIX) 40 MG tablet Take 1 tablet (40 mg total) by mouth 2 (two) times daily. 90 tablet 5   potassium chloride (KLOR-CON) 10 MEQ tablet Take 1 tablet (10 mEq total) by mouth daily. 90 tablet 1   rosuvastatin (CRESTOR) 10 MG tablet Take 1 tablet (10 mg total) by mouth at bedtime. 90 tablet 1   Semaglutide, 1 MG/DOSE, 4 MG/3ML SOPN Inject 1 mg as directed once a week. 3 mL 3   UNABLE TO FIND Vitamin d daily     metoprolol tartrate (LOPRESSOR) 100 MG tablet Take 1 tablet (100 mg total) by mouth once for 1 dose. 2 hours prior to CTA 1 tablet 0   No current facility-administered medications for this visit.    Physical Exam:     BP (!) 142/84   Pulse 95   Ht 6' (1.829 m)   Wt 275 lb 8 oz (125 kg)   SpO2 98%   BMI 37.36 kg/m   GENERAL:  Pleasant male in NAD PSYCH: : Cooperative, normal  affect NEURO: Alert and oriented x 3, no focal neurologic deficits   IMPRESSION and PLAN:    1) Acute diarrhea - Acute diarrheal symptoms have since resolved and no longer taking Lomotil.  Suspect acute infection (viral?)  Superimposed on significant social stressors at the time.  Thankfully symptoms have resolved and no longer taking antidiarrheal agents - Continue high-fiber diet with fiber supplement as needed - Continue adequate hydration  2) IBS - Back to baseline bowel habits - Continue conservative management as above  3) GERD - Well-controlled with Protonix - Refilled Protonix 40 mg BID  4) EOE - Well-controlled with Protonix  5) History of internal hemorrhoids - No recurrence since hemorrhoid banding  series complete     RTC prn     Lavena Bullion ,DO, FACG 02/27/2021, 11:46 AM

## 2021-02-27 NOTE — Patient Instructions (Signed)
If you are age 46 or older, your body mass index should be between 23-30. Your Body mass index is 37.36 kg/m. If this is out of the aforementioned range listed, please consider follow up with your Primary Care Provider.  If you are age 45 or younger, your body mass index should be between 19-25. Your Body mass index is 37.36 kg/m. If this is out of the aformentioned range listed, please consider follow up with your Primary Care Provider.   We have sent the following medications to your pharmacy for you to pick up at your convenience:  Protonix 40 mg twice daily.  Due to recent changes in healthcare laws, you may see the results of your imaging and laboratory studies on MyChart before your provider has had a chance to review them.  We understand that in some cases there may be results that are confusing or concerning to you. Not all laboratory results come back in the same time frame and the provider may be waiting for multiple results in order to interpret others.  Please give [place] 48 hours in order for your provider to thoroughly review all the results before contacting the office for clarification of your results.   Thank you for choosing me and [facility].  Vito Cirigliano, D.O.

## 2021-03-02 ENCOUNTER — Other Ambulatory Visit: Payer: Self-pay | Admitting: Gastroenterology

## 2021-03-06 ENCOUNTER — Telehealth: Payer: Self-pay | Admitting: Gastroenterology

## 2021-03-06 NOTE — Telephone Encounter (Signed)
See the My Chart message from the pt I have faxed records to [place] with explanation for the test ordered.

## 2021-03-15 ENCOUNTER — Ambulatory Visit: Payer: 59 | Admitting: Psychology

## 2021-03-15 DIAGNOSIS — F509 Eating disorder, unspecified: Secondary | ICD-10-CM | POA: Diagnosis not present

## 2021-03-29 ENCOUNTER — Ambulatory Visit: Payer: 59 | Admitting: Psychology

## 2021-03-29 DIAGNOSIS — F509 Eating disorder, unspecified: Secondary | ICD-10-CM | POA: Diagnosis not present

## 2021-03-30 ENCOUNTER — Other Ambulatory Visit (HOSPITAL_BASED_OUTPATIENT_CLINIC_OR_DEPARTMENT_OTHER): Payer: Self-pay

## 2021-04-24 ENCOUNTER — Encounter: Payer: 59 | Attending: Surgery | Admitting: Skilled Nursing Facility1

## 2021-04-24 ENCOUNTER — Other Ambulatory Visit: Payer: Self-pay

## 2021-04-24 DIAGNOSIS — E119 Type 2 diabetes mellitus without complications: Secondary | ICD-10-CM | POA: Diagnosis not present

## 2021-04-24 NOTE — Progress Notes (Signed)
Pre-Operative Nutrition Class:    Patient was seen on 04/24/2021 for Pre-Operative Bariatric Surgery Education at [facility].    Surgery date:  Surgery type: sleeve Start weight at [facility]: 277.5 pounds Weight today: 274.3 pounds  Samples given per MNT protocol. Patient educated on appropriate usage:  Bariatric Advantage Multivitamin Lot # [id number] Exp: 08/23   Procare Calcium  Lot # [id number] Exp: 03/23   Bariatric Advantage protein powder Lot # [id number] Exp: 10/23  The following the learning objectives were met by the patient during this course: Identify Pre-Op Dietary Goals and will begin 2 weeks pre-operatively Identify appropriate sources of fluids and proteins  State protein recommendations and appropriate sources pre and post-operatively Identify Post-Operative Dietary Goals and will follow for 2 weeks post-operatively Identify appropriate multivitamin and calcium sources Describe the need for physical activity post-operatively and will follow MD recommendations State when to call healthcare provider regarding medication questions or post-operative complications When having a diagnosis of diabetes understanding hypoglycemia symptoms and the inclusion of 1 complex carbohydrate per meal  Handouts given during class include: Pre-Op Bariatric Surgery Diet Handout Protein Shake Handout Post-Op Bariatric Surgery Nutrition Handout BELT Program Information Flyer Support Group Information Flyer [facility] Bariatric Supplements Price List  Follow-Up Plan: Patient will follow-up at [facility] 2 weeks post operatively for diet advancement per MD.

## 2021-05-01 DIAGNOSIS — F411 Generalized anxiety disorder: Secondary | ICD-10-CM | POA: Diagnosis not present

## 2021-05-01 DIAGNOSIS — F4310 Post-traumatic stress disorder, unspecified: Secondary | ICD-10-CM | POA: Diagnosis not present

## 2021-05-01 DIAGNOSIS — F410 Panic disorder [episodic paroxysmal anxiety] without agoraphobia: Secondary | ICD-10-CM | POA: Diagnosis not present

## 2021-05-01 DIAGNOSIS — F4010 Social phobia, unspecified: Secondary | ICD-10-CM | POA: Diagnosis not present

## 2021-05-01 DIAGNOSIS — F339 Major depressive disorder, recurrent, unspecified: Secondary | ICD-10-CM | POA: Diagnosis not present

## 2021-05-09 ENCOUNTER — Other Ambulatory Visit (HOSPITAL_COMMUNITY): Payer: Self-pay

## 2021-05-15 NOTE — Progress Notes (Signed)
Sent message, via epic in basket, requesting orders in epic from surgeon.  

## 2021-05-16 NOTE — Telephone Encounter (Signed)
Please submit appeal on patient behalf for coverage of stool studies.  These were ordered for infectious diarrhea.  I cannot see a clinical basis to deem these "experimental and investigational", as the studies were ordered clearly well within guidelines for evaluation of infectious diarrhea.

## 2021-05-17 ENCOUNTER — Other Ambulatory Visit (HOSPITAL_COMMUNITY): Payer: Self-pay

## 2021-05-17 ENCOUNTER — Ambulatory Visit: Payer: Self-pay | Admitting: Surgery

## 2021-05-18 ENCOUNTER — Other Ambulatory Visit (HOSPITAL_COMMUNITY): Payer: Self-pay | Admitting: Surgery

## 2021-05-18 NOTE — Telephone Encounter (Signed)
Unable to submit appeal through [facility], no active account and it is billed from the lab. I spoke with the patient and advised that I needed to know which lab they were referring too, we figured out that it was the GI profile stool, PCR. I called over to [facility] and spoke with Claiborne Billings. She states that I will have to fax a letter and most recent office note to [phone] (patient customer service fax) and they will have to resubmit information to insurance once additional diagnoses code has been added. I have faxed this information to them. The patient is aware, he has been advised that I am not sure how long it is going to take for the insurance to re-process everything. Pt was thankful for what I could help him with. Pt had no other concerns at the end of the call.

## 2021-05-19 NOTE — Progress Notes (Signed)
DUE TO COVID-19 ONLY ONE VISITOR IS ALLOWED TO COME WITH YOU AND STAY IN THE WAITING ROOM ONLY DURING PRE OP AND PROCEDURE DAY OF SURGERY.  2 VISITOR  MAY VISIT WITH YOU AFTER SURGERY IN YOUR PRIVATE ROOM DURING VISITING HOURS ONLY!  YOU NEED TO HAVE A COVID 19 TEST ON____10/27/22 @_  @_from  8am-3pm _____, THIS TEST MUST BE DONE BEFORE SURGERY,  Covid test is done at [place], [place] [address].  This is a drive thru.  No appt required. Please see map.                 Your procedure is scheduled on:  06/05/21   Report to [facility] Main  Entrance   Report to admitting at    [phone]     Call this number if you have problems the morning of surgery [phone]    REMEMBER: NO  SOLID FOOD CANDY OR GUM AFTER MIDNIGHT. CLEAR LIQUIDS UNTIL   0415am       . NOTHING BY MOUTH EXCEPT CLEAR LIQUIDS UNTIL  0415am   . PLEASE FINISH ENSURE DRINK PER SURGEON ORDER  WHICH NEEDS TO BE COMPLETED AT   0415am    .      CLEAR LIQUID DIET   Foods Allowed                                                                    Coffee and tea, regular and decaf                            Fruit ices (not with fruit pulp)                                      Iced Popsicles                                    Carbonated beverages, regular and diet                                    Cranberry, grape and apple juices Sports drinks like Gatorade Lightly seasoned clear broth or consume(fat free) Sugar, honey syrup ___________________________________________________________________      BRUSH YOUR TEETH MORNING OF SURGERY AND RINSE YOUR MOUTH OUT, NO CHEWING GUM CANDY OR MINTS.     Take these medicines the morning of surgery with A SIP OF WATER:  amlodipine, arimidex, protonix, atenolol, cymbalta, gabapentin  DO NOT TAKE ANY DIABETIC MEDICATIONS DAY OF YOUR SURGERY                               You may not have any metal on your body including hair pins and              piercings  Do  not wear jewelry, make-up, lotions, powders or perfumes, deodorant  Do not wear nail polish on your fingernails.  Do not shave  48 hours prior to surgery.              Men may shave face and neck.   Do not bring valuables to the hospital. [place].  Contacts, dentures or bridgework may not be worn into surgery.  Leave suitcase in the car. After surgery it may be brought to your room.     Patients discharged the day of surgery will not be allowed to drive home. IF YOU ARE HAVING SURGERY AND GOING HOME THE SAME DAY, YOU MUST HAVE AN ADULT TO DRIVE YOU HOME AND BE WITH YOU FOR 24 HOURS. YOU MAY GO HOME BY TAXI OR UBER OR ORTHERWISE, BUT AN ADULT MUST ACCOMPANY YOU HOME AND STAY WITH YOU FOR 24 HOURS.  Name and phone number of your driver:  Special Instructions: N/A              Please read over the following fact sheets you were given: _____________________________________________________________________  [facility] - Preparing for Surgery Before surgery, you can play an important role.  Because skin is not sterile, your skin needs to be as free of germs as possible.  You can reduce the number of germs on your skin by washing with CHG (chlorahexidine gluconate) soap before surgery.  CHG is an antiseptic cleaner which kills germs and bonds with the skin to continue killing germs even after washing. Please DO NOT use if you have an allergy to CHG or antibacterial soaps.  If your skin becomes reddened/irritated stop using the CHG and inform your nurse when you arrive at Short Stay. Do not shave (including legs and underarms) for at least 48 hours prior to the first CHG shower.  You may shave your face/neck. Please follow these instructions carefully:  1.  Shower with CHG Soap the night before surgery and the  morning of Surgery.  2.  If you choose to wash your hair, wash your hair first as usual with your  normal  shampoo.  3.  After you  shampoo, rinse your hair and body thoroughly to remove the  shampoo.                           4.  Use CHG as you would any other liquid soap.  You can apply chg directly  to the skin and wash                       Gently with a scrungie or clean washcloth.  5.  Apply the CHG Soap to your body ONLY FROM THE NECK DOWN.   Do not use on face/ open                           Wound or open sores. Avoid contact with eyes, ears mouth and genitals (private parts).                       Wash face,  Genitals (private parts) with your normal soap.             6.  Wash thoroughly, paying special attention to the area where your surgery  will be performed.  7.  Thoroughly rinse your body with  warm water from the neck down.  8.  DO NOT shower/wash with your normal soap after using and rinsing off  the CHG Soap.                9.  Pat yourself dry with a clean towel.            10.  Wear clean pajamas.            11.  Place clean sheets on your bed the night of your first shower and do not  sleep with pets. Day of Surgery : Do not apply any lotions/deodorants the morning of surgery.  Please wear clean clothes to the hospital/surgery center.  FAILURE TO FOLLOW THESE INSTRUCTIONS MAY RESULT IN THE CANCELLATION OF YOUR SURGERY PATIENT SIGNATURE_________________________________  NURSE SIGNATURE__________________________________  ________________________________________________________________________

## 2021-05-19 NOTE — Progress Notes (Addendum)
Anesthesia Review:  PCP: DR  Luetta Nutting  LOV 02/14/21  Cardiologist : DR Percival Spanish [place] 12/08/20  Chest x-ray : 1V- 07/12/2020  EKG : 12/08/20  CT Cors- 12/28/20  Echo : Stress test: Cardiac Cath :  Activity level: can do a flight of stairs without difficulty  Sleep Study/ CPAP : has cpap to bring mask and tubing DOS  Fasting Blood Sugar :      / Checks Blood Sugar -- times a day:   Blood Thinner/ Instructions /Last Dose: ASA / Instructions/ Last Dose :   DM- type 2 does not check glucose at home  Hgba1c- 05/23/21- 6.8  RN at [facility] working on NP  Covid test on 06/01/21.

## 2021-05-23 ENCOUNTER — Encounter (HOSPITAL_COMMUNITY): Payer: Self-pay

## 2021-05-23 ENCOUNTER — Encounter (HOSPITAL_COMMUNITY)
Admission: RE | Admit: 2021-05-23 | Discharge: 2021-05-23 | Disposition: A | Discharge status: Home / Self Care | Payer: 59 | Source: Ambulatory Visit | Attending: Surgery | Admitting: Surgery

## 2021-05-23 ENCOUNTER — Other Ambulatory Visit: Payer: Self-pay

## 2021-05-23 DIAGNOSIS — Z87891 Personal history of nicotine dependence: Secondary | ICD-10-CM | POA: Insufficient documentation

## 2021-05-23 DIAGNOSIS — E119 Type 2 diabetes mellitus without complications: Secondary | ICD-10-CM | POA: Diagnosis not present

## 2021-05-23 DIAGNOSIS — Z9989 Dependence on other enabling machines and devices: Secondary | ICD-10-CM | POA: Diagnosis not present

## 2021-05-23 DIAGNOSIS — Z7985 Long-term (current) use of injectable non-insulin antidiabetic drugs: Secondary | ICD-10-CM | POA: Diagnosis not present

## 2021-05-23 DIAGNOSIS — I10 Essential (primary) hypertension: Secondary | ICD-10-CM | POA: Diagnosis not present

## 2021-05-23 DIAGNOSIS — Z79899 Other long term (current) drug therapy: Secondary | ICD-10-CM | POA: Diagnosis not present

## 2021-05-23 DIAGNOSIS — Z01812 Encounter for preprocedural laboratory examination: Secondary | ICD-10-CM | POA: Insufficient documentation

## 2021-05-23 DIAGNOSIS — G4733 Obstructive sleep apnea (adult) (pediatric): Secondary | ICD-10-CM | POA: Insufficient documentation

## 2021-05-23 DIAGNOSIS — Z6837 Body mass index (BMI) 37.0-37.9, adult: Secondary | ICD-10-CM | POA: Diagnosis not present

## 2021-05-23 LAB — CBC WITH DIFFERENTIAL/PLATELET
Abs Immature Granulocytes: 0.07 10*3/uL (ref 0.00–0.07)
Basophils Absolute: 0.1 10*3/uL (ref 0.0–0.1)
Basophils Relative: 1 %
Eosinophils Absolute: 0.6 10*3/uL — ABNORMAL HIGH (ref 0.0–0.5)
Eosinophils Relative: 6 %
HCT: 44.5 % (ref 39.0–52.0)
Hemoglobin: 14.3 g/dL (ref 13.0–17.0)
Immature Granulocytes: 1 %
Lymphocytes Relative: 21 %
Lymphs Abs: 2.1 10*3/uL (ref 0.7–4.0)
MCH: 28.1 pg (ref 26.0–34.0)
MCHC: 32.1 g/dL (ref 30.0–36.0)
MCV: 87.6 fL (ref 80.0–100.0)
Monocytes Absolute: 0.6 10*3/uL (ref 0.1–1.0)
Monocytes Relative: 6 %
Neutro Abs: 6.7 10*3/uL (ref 1.7–7.7)
Neutrophils Relative %: 65 %
Platelets: 315 10*3/uL (ref 150–400)
RBC: 5.08 MIL/uL (ref 4.22–5.81)
RDW: 13.6 % (ref 11.5–15.5)
WBC: 10.1 10*3/uL (ref 4.0–10.5)
nRBC: 0 % (ref 0.0–0.2)

## 2021-05-23 LAB — COMPREHENSIVE METABOLIC PANEL
ALT: 48 U/L — ABNORMAL HIGH (ref 0–44)
AST: 59 U/L — ABNORMAL HIGH (ref 15–41)
Albumin: 4.3 g/dL (ref 3.5–5.0)
Alkaline Phosphatase: 58 U/L (ref 38–126)
Anion gap: 11 (ref 5–15)
BUN: 20 mg/dL (ref 6–20)
CO2: 23 mmol/L (ref 22–32)
Calcium: 9.1 mg/dL (ref 8.9–10.3)
Chloride: 104 mmol/L (ref 98–111)
Creatinine, Ser: 1.05 mg/dL (ref 0.61–1.24)
GFR, Estimated: >60 mL/min (ref >60)
Glucose, Bld: 126 mg/dL — ABNORMAL HIGH (ref 70–99)
Potassium: 3.8 mmol/L (ref 3.5–5.1)
Sodium: 138 mmol/L (ref 135–145)
Total Bilirubin: 0.7 mg/dL (ref 0.3–1.2)
Total Protein: 7.7 g/dL (ref 6.5–8.1)

## 2021-05-23 LAB — HEMOGLOBIN A1C
Hgb A1c MFr Bld: 6.8 % — ABNORMAL HIGH (ref 4.8–5.6)
Mean Plasma Glucose: 148.46 mg/dL

## 2021-05-23 LAB — GLUCOSE, CAPILLARY: Glucose-Capillary: 134 mg/dL — ABNORMAL HIGH (ref 70–99)

## 2021-05-24 NOTE — Anesthesia Preprocedure Evaluation (Addendum)
Anesthesia Evaluation  Patient identified by MRN, date of birth, ID band Patient awake    Reviewed: Allergy & Precautions, NPO status , Patient's Chart, lab work & pertinent test results  Airway Mallampati: III  TM Distance: >3 FB Neck ROM: Full    Dental  (+) Teeth Intact, Dental Advisory Given   Pulmonary sleep apnea and Continuous Positive Airway Pressure Ventilation , former smoker,  Quit smoking 2010, 10 pack year history    Pulmonary exam normal breath sounds clear to auscultation       Cardiovascular hypertension, Pt. on medications and Pt. on home beta blockers Normal cardiovascular exam Rhythm:Regular Rate:Normal     Neuro/Psych PSYCHIATRIC DISORDERS Anxiety Depression negative neurological ROS     GI/Hepatic GERD (currently with some GERD)  Controlled,(+)     substance abuse (remote hx EtOH abuse)  alcohol use,   Endo/Other  diabetes, Well Controlled, Type 2, Oral Hypoglycemic AgentsObesity BM 37 a1c 6.8  Renal/GU   negative genitourinary   Musculoskeletal negative musculoskeletal ROS (+)   Abdominal (+) + obese,   Peds  Hematology negative hematology ROS (+) hct 44.5   Anesthesia Other Findings   Reproductive/Obstetrics negative OB ROS                           Anesthesia Physical Anesthesia Plan  ASA: 3  Anesthesia Plan: General   Post-op Pain Management:    Induction: Intravenous, Rapid sequence and Cricoid pressure planned  PONV Risk Score and Plan: 3 and Ondansetron, Dexamethasone, Midazolam and Treatment may vary due to age or medical condition  Airway Management Planned: Oral ETT  Additional Equipment: None  Intra-op Plan:   Post-operative Plan: Extubation in OR  Informed Consent: I have reviewed the patients History and Physical, chart, labs and discussed the procedure including the risks, benefits and alternatives for the proposed anesthesia with the  patient or authorized representative who has indicated his/her understanding and acceptance.     Dental advisory given  Plan Discussed with: CRNA  Anesthesia Plan Comments:       Anesthesia Quick Evaluation

## 2021-05-24 NOTE — Progress Notes (Signed)
Anesthesia Chart Review   Case: [id number] Date/Time: 06/05/21 0700   Procedures:      XI ROBOTIC GASTRIC SLEEVE RESECTION     UPPER GI ENDOSCOPY   Anesthesia type: General   Pre-op diagnosis: MORBID OBESITY   Location: [facility] / [facility]   Surgeons: Johnathan Hausen, MD       DISCUSSION:45 y.o. former smoker with h/o HTN, DM II (A1C 6.8), OSA on CPAP, alcohol abuse sober since 04/2018, morbid obesity scheduled for above procedure 06/05/2021 with Dr. Johnathan Hausen.   Pt last seen by PCP 02/14/2021, stable at this visit.   Last seen by cardiology 12/08/2020. CT coronary ordered at this visit.   CT coronary 12/28/2020 with with no CAD, no further cardiac workup indicated per Dr. Percival Spanish.   Anticipate pt can proceed with planned procedure barring acute status change.   VS: BP 138/81   Pulse 85   Temp 37.1 C (Oral)   Resp 16   Ht 6' (1.829 m)   Wt 124.3 kg   SpO2 100%   BMI 37.17 kg/m   PROVIDERS: Luetta Nutting, DO is PCP   Minus Breeding, MD is Cardiologist  LABS: Labs reviewed: Acceptable for surgery. (all labs ordered are listed, but only abnormal results are displayed)  Labs Reviewed  HEMOGLOBIN A1C - Abnormal; Notable for the following components:      Result Value   Hgb A1c MFr Bld 6.8 (*)    All other components within normal limits  CBC WITH DIFFERENTIAL/PLATELET - Abnormal; Notable for the following components:   Eosinophils Absolute 0.6 (*)    All other components within normal limits  COMPREHENSIVE METABOLIC PANEL - Abnormal; Notable for the following components:   Glucose, Bld 126 (*)    AST 59 (*)    ALT 48 (*)    All other components within normal limits  GLUCOSE, CAPILLARY - Abnormal; Notable for the following components:   Glucose-Capillary 134 (*)    All other components within normal limits  TYPE AND SCREEN     IMAGES:   EKG: 12/08/2020 Rate 88 bpm  Sinus rhythm with 1st degree AV block   CV: Stress Test 10/05/2009 IMPRESSION:     1.  Negative for exercise-stress induced ischemia.     2. Left ventricular ejection fraction 71%.  Past Medical History:  Diagnosis Date   Acne    on back taking doxycycline for   Alcoholism [facility])    sober since 04/2018   Anxiety    Depression    DM type 2 (diabetes mellitus, type 2) ([place])    ED (erectile dysfunction)    GAD (generalized anxiety disorder)    GERD (gastroesophageal reflux disease)    Gynecomastia, male    followed by dr Shellia Cleverly   High serum estradiol    resolved   History of anal fissures    History of kidney stones    History of small bowel obstruction 2004   Hypertension    IBS (irritable bowel syndrome)    Mixed hyperlipidemia    OSA on CPAP    per last study 07-31-2013  severe osa   Primary hypogonadism in male    endocrinologist-  dr Shellia Cleverly    Past Surgical History:  Procedure Laterality Date   CARDIOVASCULAR STRESS TEST  10/05/2009   normal nuclear study w/ no ischemia/  normal LV function and wall motion , ef 71%   COLONOSCOPY  last one   2004, april 2021   EVALUATION UNDER ANESTHESIA  WITH ANAL FISTULECTOMY N/A 02/13/2018   Procedure: ANAL EXAM UNDER ANESTHESIA WITH BIOPSY;  Surgeon: [name], MD;  Location: [facility];  Service: General;  Laterality: N/A;   ORCHIECTOMY Left 1991   w/ placement prosthesis (for torsion)   TESTICULAR EXPLORATION Left 01/29/2020   Procedure: EXCHANGE OF LEFT TESTICULAR PROSTHESIS;  Surgeon: Cleon Gustin, MD;  Location: [facility];  Service: Urology;  Laterality: Left;   URETEROLITHOTOMY  1999    MEDICATIONS:  amLODipine (NORVASC) 10 MG tablet   anastrozole (ARIMIDEX) 1 MG tablet   atenolol (TENORMIN) 100 MG tablet   Calcium Carbonate-Vitamin D (CALCIUM-VITAMIN D) 600-125 MG-UNIT TABS   clomiPHENE (CLOMID) 50 MG tablet   CRANBERRY PO   CREON 36000-114000 units CPEP capsule   DULoxetine (CYMBALTA) 60 MG capsule   gabapentin (NEURONTIN) 600 MG tablet   icosapent Ethyl  (VASCEPA) 1 g capsule   lisinopril-hydrochlorothiazide (ZESTORETIC) 20-25 MG tablet   metformin (FORTAMET) 500 MG (OSM) 24 hr tablet   metoprolol tartrate (LOPRESSOR) 100 MG tablet   Multiple Vitamin (MULTIVITAMIN WITH MINERALS) TABS tablet   Omega-3 Fatty Acids (FISH OIL) 1000 MG CAPS   pantoprazole (PROTONIX) 40 MG tablet   potassium chloride (KLOR-CON) 10 MEQ tablet   psyllium (REGULOID) 0.52 g capsule   rosuvastatin (CRESTOR) 10 MG tablet   Semaglutide, 1 MG/DOSE, 4 MG/3ML SOPN   vitamin B-12 (CYANOCOBALAMIN) 500 MCG tablet   No current facility-administered medications for this encounter.   Konrad Felix Ward, PA-C [facility] Pre-Surgical Testing [phone]

## 2021-05-25 ENCOUNTER — Other Ambulatory Visit: Payer: Self-pay

## 2021-05-25 ENCOUNTER — Other Ambulatory Visit (HOSPITAL_COMMUNITY): Payer: Self-pay | Admitting: Surgery

## 2021-05-25 ENCOUNTER — Ambulatory Visit (HOSPITAL_COMMUNITY)
Admission: RE | Admit: 2021-05-25 | Discharge: 2021-05-25 | Disposition: A | Discharge status: Home / Self Care | Payer: 59 | Source: Ambulatory Visit | Attending: Surgery | Admitting: Surgery

## 2021-05-25 DIAGNOSIS — K219 Gastro-esophageal reflux disease without esophagitis: Secondary | ICD-10-CM | POA: Diagnosis not present

## 2021-05-25 DIAGNOSIS — Z01818 Encounter for other preprocedural examination: Secondary | ICD-10-CM | POA: Diagnosis not present

## 2021-05-29 ENCOUNTER — Other Ambulatory Visit (HOSPITAL_BASED_OUTPATIENT_CLINIC_OR_DEPARTMENT_OTHER): Payer: Self-pay

## 2021-06-01 ENCOUNTER — Other Ambulatory Visit: Payer: Self-pay | Admitting: Surgery

## 2021-06-01 LAB — SARS CORONAVIRUS 2 (TAT 6-24 HRS): SARS Coronavirus 2: NEGATIVE

## 2021-06-02 NOTE — H&P (Signed)
MRN: [id number] DOB: [birth year] DATE OF ENCOUNTER:  Subjective   Chief Complaint: Pre-op Exam   History of Present Illness: Tyler Gross is a 45 y.o. male who is seen for preop visit for his sleeve gastrectomy scheduled for Halloween.His UGI showed no hiatal hernia but with some spontaneous reflux.    I answered his questions and he is excited about having a sleeve gastrectomy. He works at [employer] and his boss had the surgery and told him that he would expect to be out for 8 weeks. He feels like he could be going back sooner and I agree with that..  Review of Systems: See HPI as well for other ROS.  ROS   Medical History: Past Medical History:  Diagnosis Date   Anxiety   Diabetes mellitus without complication (CMS-HCC)   GERD (gastroesophageal reflux disease)   Hyperlipidemia   Hypertension   Sleep apnea   There is no problem list on file for this patient.  Past Surgical History:  Procedure Laterality Date   ORCHIECTOMY Left    Allergies  Allergen Reactions   Sulfa (Sulfonamide Antibiotics) Hives  NDC OYDX:[id number] NDC CNOB:[id number] NDC TMLY:[id number]   Atorvastatin Other (See Comments)  NDC EXNT:[id number] NDC PRFF:[id number] Muscle fatigue Muscle fatigue   Current Outpatient Medications on File Prior to Visit  Medication Sig Dispense Refill   amLODIPine (NORVASC) 10 MG tablet Take 10 mg by mouth every morning   atenoloL (TENORMIN) 100 MG tablet Take by mouth   clomiPHENE (CLOMID) 50 mg tablet TAKE 1/2 TABLET BY MOUTH EVERY OTHER DAY   gabapentin (NEURONTIN) 600 MG tablet Take 600 mg by mouth 2 (two) times daily   icosapent ethyL (VASCEPA) 1 gram capsule Take 2 g by mouth 2 (two) times daily   lisinopriL-hydrochlorothiazide (ZESTORETIC) 10-12.5 mg tablet qd   metFORMIN (FORTAMET) 1000 MG (OSM) 24 hr tablet bid   metoprolol tartrate (LOPRESSOR) 100 MG tablet Take by mouth   pantoprazole (PROTONIX) 20 MG DR tablet qd   rosuvastatin  (CRESTOR) 10 MG tablet Crestor 10 mg tablet   anastrozole (ARIMIDEX) 1 mg tablet   cyanocobalamin (VITAMIN B12) 500 MCG tablet Take by mouth   DULoxetine (CYMBALTA) 60 MG DR capsule   OZEMPIC 0.25 mg or 0.5 mg(2 mg/1.5 mL) pen injector   psyllium (METAMUCIL) 0.52 gram capsule Take by mouth   No current facility-administered medications on file prior to visit.   Family History  Problem Relation Age of Onset   High blood pressure (Hypertension) Mother    Social History   Tobacco Use  Smoking Status Former Smoker   Quit date: 2010   Years since quitting: 12.7  Smokeless Tobacco Never Used    Social History   Socioeconomic History   Marital status: Single  Tobacco Use   Smoking status: Former Smoker  Quit date: 2010  Years since quitting: 12.7   Smokeless tobacco: Never Used  Scientific laboratory technician Use: Never used  Substance and Sexual Activity   Alcohol use: Never   Drug use: Never   Objective:   Vitals:  BP: 120/82  Pulse: 103  SpO2: 91%  Weight: (!) 124.3 kg (274 lb)  Height: 182.9 cm (6')   Body mass index is 37.16 kg/m.  Physical Exam General: Well-developed well maintained white male no acute distress HEENT: Glasses but otherwise unremarkable Chest : Clear Heart: Sinus rhythm Breast: Not examined Abdomen: Nontender GU not examined Rectal not examined Extremities full range of motion Neuro alert and oriented x3. Motor  and sensory function grossly intact  Labs, Imaging and Diagnostic Testing:  Obesity and spontaneous GER without hiatal hernia by UGI.  For robotic sleeve gastrectomy.    Assessment and Plan:    Morbid (severe) obesity due to excess calories (CMS-HCC)  For robotic sleeve gastrectomy      Keerthi Hazell Donia Pounds, MD

## 2021-06-04 ENCOUNTER — Other Ambulatory Visit: Payer: Self-pay | Admitting: Family Medicine

## 2021-06-04 ENCOUNTER — Other Ambulatory Visit: Payer: Self-pay | Admitting: Urology

## 2021-06-05 ENCOUNTER — Inpatient Hospital Stay (HOSPITAL_COMMUNITY)
Admission: RE | Admit: 2021-06-05 | Discharge: 2021-06-06 | DRG: 621 | Disposition: A | Discharge status: Home / Self Care | Payer: 59 | Source: Ambulatory Visit | Attending: Surgery | Admitting: Surgery

## 2021-06-05 ENCOUNTER — Encounter (HOSPITAL_COMMUNITY)
Admission: RE | Disposition: A | Discharge status: Home / Self Care | Payer: Self-pay | Source: Ambulatory Visit | Attending: Surgery

## 2021-06-05 ENCOUNTER — Encounter (HOSPITAL_COMMUNITY): Payer: Self-pay | Admitting: Surgery

## 2021-06-05 ENCOUNTER — Other Ambulatory Visit (HOSPITAL_COMMUNITY): Payer: Self-pay

## 2021-06-05 ENCOUNTER — Inpatient Hospital Stay (HOSPITAL_COMMUNITY): Payer: 59 | Admitting: Certified Registered Nurse Anesthetist

## 2021-06-05 ENCOUNTER — Other Ambulatory Visit: Payer: Self-pay

## 2021-06-05 ENCOUNTER — Inpatient Hospital Stay (HOSPITAL_COMMUNITY): Payer: 59 | Admitting: Physician Assistant

## 2021-06-05 DIAGNOSIS — Z79899 Other long term (current) drug therapy: Secondary | ICD-10-CM | POA: Diagnosis not present

## 2021-06-05 DIAGNOSIS — F411 Generalized anxiety disorder: Secondary | ICD-10-CM | POA: Diagnosis present

## 2021-06-05 DIAGNOSIS — Z87891 Personal history of nicotine dependence: Secondary | ICD-10-CM

## 2021-06-05 DIAGNOSIS — Z6835 Body mass index (BMI) 35.0-35.9, adult: Secondary | ICD-10-CM | POA: Diagnosis not present

## 2021-06-05 DIAGNOSIS — Z8249 Family history of ischemic heart disease and other diseases of the circulatory system: Secondary | ICD-10-CM | POA: Diagnosis not present

## 2021-06-05 DIAGNOSIS — E782 Mixed hyperlipidemia: Secondary | ICD-10-CM | POA: Diagnosis present

## 2021-06-05 DIAGNOSIS — G4733 Obstructive sleep apnea (adult) (pediatric): Secondary | ICD-10-CM | POA: Diagnosis not present

## 2021-06-05 DIAGNOSIS — Z9989 Dependence on other enabling machines and devices: Secondary | ICD-10-CM | POA: Diagnosis not present

## 2021-06-05 DIAGNOSIS — Z6837 Body mass index (BMI) 37.0-37.9, adult: Secondary | ICD-10-CM | POA: Diagnosis not present

## 2021-06-05 DIAGNOSIS — Z20822 Contact with and (suspected) exposure to covid-19: Secondary | ICD-10-CM | POA: Diagnosis not present

## 2021-06-05 DIAGNOSIS — K219 Gastro-esophageal reflux disease without esophagitis: Secondary | ICD-10-CM | POA: Diagnosis present

## 2021-06-05 DIAGNOSIS — E119 Type 2 diabetes mellitus without complications: Secondary | ICD-10-CM | POA: Diagnosis present

## 2021-06-05 DIAGNOSIS — Z9884 Bariatric surgery status: Secondary | ICD-10-CM

## 2021-06-05 DIAGNOSIS — I10 Essential (primary) hypertension: Secondary | ICD-10-CM | POA: Diagnosis present

## 2021-06-05 DIAGNOSIS — Z7984 Long term (current) use of oral hypoglycemic drugs: Secondary | ICD-10-CM | POA: Diagnosis not present

## 2021-06-05 HISTORY — PX: UPPER GI ENDOSCOPY: SHX6162

## 2021-06-05 LAB — CBC
HCT: 42.3 % (ref 39.0–52.0)
Hemoglobin: 13.6 g/dL (ref 13.0–17.0)
MCH: 28.8 pg (ref 26.0–34.0)
MCHC: 32.2 g/dL (ref 30.0–36.0)
MCV: 89.4 fL (ref 80.0–100.0)
Platelets: 261 10*3/uL (ref 150–400)
RBC: 4.73 MIL/uL (ref 4.22–5.81)
RDW: 13.8 % (ref 11.5–15.5)
WBC: 13.1 10*3/uL — ABNORMAL HIGH (ref 4.0–10.5)
nRBC: 0 % (ref 0.0–0.2)

## 2021-06-05 LAB — GLUCOSE, CAPILLARY
Glucose-Capillary: 137 mg/dL — ABNORMAL HIGH (ref 70–99)
Glucose-Capillary: 169 mg/dL — ABNORMAL HIGH (ref 70–99)

## 2021-06-05 LAB — TYPE AND SCREEN
ABO/RH(D): AB POS
Antibody Screen: NEGATIVE

## 2021-06-05 LAB — CREATININE, SERUM
Creatinine, Ser: 1.26 mg/dL — ABNORMAL HIGH (ref 0.61–1.24)
GFR, Estimated: >60 mL/min (ref >60)

## 2021-06-05 LAB — HEMOGLOBIN AND HEMATOCRIT, BLOOD
HCT: 42 % (ref 39.0–52.0)
Hemoglobin: 13.7 g/dL (ref 13.0–17.0)

## 2021-06-05 LAB — ABO/RH: ABO/RH(D): AB POS

## 2021-06-05 SURGERY — XI ROBOTIC GASTRIC SLEEVE RESECTION
Anesthesia: General | Site: Esophagus

## 2021-06-05 MED ORDER — GABAPENTIN 300 MG PO CAPS
300.0000 mg | ORAL_CAPSULE | ORAL | Status: DC
  Filled 2021-06-05: qty 1

## 2021-06-05 MED ORDER — GABAPENTIN 300 MG PO CAPS
600.0000 mg | ORAL_CAPSULE | Freq: Two times a day (BID) | ORAL | Status: DC
  Administered 2021-06-05 – 2021-06-06 (×2): 600 mg via ORAL
  Filled 2021-06-05 (×2): qty 2

## 2021-06-05 MED ORDER — APREPITANT 40 MG PO CAPS
40.0000 mg | ORAL_CAPSULE | ORAL | Status: AC
  Administered 2021-06-05: 40 mg via ORAL
  Filled 2021-06-05: qty 1

## 2021-06-05 MED ORDER — PHENYLEPHRINE HCL-NACL 20-0.9 MG/250ML-% IV SOLN
INTRAVENOUS | Status: DC | PRN
  Administered 2021-06-05: 30 ug/min via INTRAVENOUS

## 2021-06-05 MED ORDER — HYDROMORPHONE HCL 1 MG/ML IJ SOLN
0.2500 mg | INTRAMUSCULAR | Status: DC | PRN
  Administered 2021-06-05 (×3): 0.5 mg via INTRAVENOUS

## 2021-06-05 MED ORDER — OXYCODONE HCL 5 MG/5ML PO SOLN
5.0000 mg | Freq: Four times a day (QID) | ORAL | Status: DC | PRN
Start: 2021-06-05 — End: 2021-06-06
  Administered 2021-06-05 – 2021-06-06 (×2): 5 mg via ORAL
  Filled 2021-06-05 (×2): qty 5

## 2021-06-05 MED ORDER — MIDAZOLAM HCL 5 MG/5ML IJ SOLN
INTRAMUSCULAR | Status: DC | PRN
  Administered 2021-06-05: 2 mg via INTRAVENOUS

## 2021-06-05 MED ORDER — CHLORHEXIDINE GLUCONATE CLOTH 2 % EX PADS: 6.0000 | MEDICATED_PAD | Freq: Once | CUTANEOUS | Status: DC

## 2021-06-05 MED ORDER — PHENYLEPHRINE 40 MCG/ML (10ML) SYRINGE FOR IV PUSH (FOR BLOOD PRESSURE SUPPORT)
PREFILLED_SYRINGE | INTRAVENOUS | Status: AC
  Filled 2021-06-05: qty 10

## 2021-06-05 MED ORDER — FENTANYL CITRATE (PF) 100 MCG/2ML IJ SOLN
INTRAMUSCULAR | Status: AC
  Filled 2021-06-05: qty 2

## 2021-06-05 MED ORDER — HYDROMORPHONE HCL 1 MG/ML IJ SOLN
INTRAMUSCULAR | Status: AC
  Administered 2021-06-05: 0.5 mg via INTRAVENOUS
  Filled 2021-06-05: qty 2

## 2021-06-05 MED ORDER — STERILE WATER FOR IRRIGATION IR SOLN
Status: DC | PRN
  Administered 2021-06-05: 2000 mL

## 2021-06-05 MED ORDER — MIDAZOLAM HCL 2 MG/2ML IJ SOLN
INTRAMUSCULAR | Status: AC
  Filled 2021-06-05: qty 2

## 2021-06-05 MED ORDER — SCOPOLAMINE 1 MG/3DAYS TD PT72
1.0000 | MEDICATED_PATCH | TRANSDERMAL | Status: DC
  Administered 2021-06-05: 1.5 mg via TRANSDERMAL
  Filled 2021-06-05: qty 1

## 2021-06-05 MED ORDER — SUCCINYLCHOLINE CHLORIDE 200 MG/10ML IV SOSY
PREFILLED_SYRINGE | INTRAVENOUS | Status: AC
  Filled 2021-06-05: qty 10

## 2021-06-05 MED ORDER — PROPOFOL 10 MG/ML IV BOLUS
INTRAVENOUS | Status: DC | PRN
  Administered 2021-06-05: 200 mg via INTRAVENOUS

## 2021-06-05 MED ORDER — EPHEDRINE 5 MG/ML INJ
INTRAVENOUS | Status: AC
  Filled 2021-06-05: qty 5

## 2021-06-05 MED ORDER — SODIUM CHLORIDE (PF) 0.9 % IJ SOLN
INTRAMUSCULAR | Status: DC | PRN
  Administered 2021-06-05: 10 mL

## 2021-06-05 MED ORDER — LIDOCAINE 2% (20 MG/ML) 5 ML SYRINGE
INTRAMUSCULAR | Status: DC | PRN
  Administered 2021-06-05: 60 mg via INTRAVENOUS

## 2021-06-05 MED ORDER — BUPIVACAINE LIPOSOME 1.3 % IJ SUSP
INTRAMUSCULAR | Status: AC
  Filled 2021-06-05: qty 20

## 2021-06-05 MED ORDER — 0.9 % SODIUM CHLORIDE (POUR BTL) OPTIME
TOPICAL | Status: DC | PRN
  Administered 2021-06-05: 1000 mL

## 2021-06-05 MED ORDER — DEXAMETHASONE SODIUM PHOSPHATE 4 MG/ML IJ SOLN
INTRAMUSCULAR | Status: DC | PRN
  Administered 2021-06-05: 5 mg via INTRAVENOUS

## 2021-06-05 MED ORDER — SUGAMMADEX SODIUM 200 MG/2ML IV SOLN
INTRAVENOUS | Status: DC | PRN
  Administered 2021-06-05: 400 mg via INTRAVENOUS

## 2021-06-05 MED ORDER — KCL IN DEXTROSE-NACL 20-5-0.45 MEQ/L-%-% IV SOLN
INTRAVENOUS | Status: DC
  Filled 2021-06-05 (×2): qty 1000

## 2021-06-05 MED ORDER — FENTANYL CITRATE (PF) 250 MCG/5ML IJ SOLN
INTRAMUSCULAR | Status: AC
  Filled 2021-06-05: qty 5

## 2021-06-05 MED ORDER — SUCCINYLCHOLINE CHLORIDE 200 MG/10ML IV SOSY
PREFILLED_SYRINGE | INTRAVENOUS | Status: DC | PRN
  Administered 2021-06-05: 180 mg via INTRAVENOUS

## 2021-06-05 MED ORDER — BUPIVACAINE LIPOSOME 1.3 % IJ SUSP
INTRAMUSCULAR | Status: DC | PRN
  Administered 2021-06-05: 20 mL

## 2021-06-05 MED ORDER — ORAL CARE MOUTH RINSE: 15.0000 mL | Freq: Once | OROMUCOSAL | Status: AC

## 2021-06-05 MED ORDER — AMISULPRIDE (ANTIEMETIC) 5 MG/2ML IV SOLN: 10.0000 mg | Freq: Once | INTRAVENOUS | Status: DC | PRN

## 2021-06-05 MED ORDER — OXYCODONE HCL 5 MG/5ML PO SOLN: 5.0000 mg | Freq: Once | ORAL | Status: DC | PRN

## 2021-06-05 MED ORDER — ONDANSETRON HCL 4 MG/2ML IJ SOLN
INTRAMUSCULAR | Status: DC | PRN
  Administered 2021-06-05: 4 mg via INTRAVENOUS

## 2021-06-05 MED ORDER — FAMOTIDINE 20 MG PO TABS
ORAL_TABLET | ORAL | Status: AC
  Filled 2021-06-05: qty 1

## 2021-06-05 MED ORDER — PHENYLEPHRINE HCL (PRESSORS) 10 MG/ML IV SOLN
INTRAVENOUS | Status: AC
  Filled 2021-06-05: qty 2

## 2021-06-05 MED ORDER — LACTATED RINGERS IR SOLN
Status: DC | PRN
  Administered 2021-06-05: 1000 mL

## 2021-06-05 MED ORDER — ENSURE MAX PROTEIN PO LIQD
2.0000 [oz_av] | ORAL | Status: DC
  Administered 2021-06-06 (×4): 2 [oz_av] via ORAL

## 2021-06-05 MED ORDER — PHENYLEPHRINE 40 MCG/ML (10ML) SYRINGE FOR IV PUSH (FOR BLOOD PRESSURE SUPPORT)
PREFILLED_SYRINGE | INTRAVENOUS | Status: DC | PRN
  Administered 2021-06-05 (×2): 120 ug via INTRAVENOUS

## 2021-06-05 MED ORDER — PROMETHAZINE HCL 25 MG/ML IJ SOLN: 6.2500 mg | INTRAMUSCULAR | Status: DC | PRN

## 2021-06-05 MED ORDER — ACETAMINOPHEN 160 MG/5ML PO SOLN: 1000.0000 mg | Freq: Three times a day (TID) | ORAL | Status: DC

## 2021-06-05 MED ORDER — FENTANYL CITRATE (PF) 100 MCG/2ML IJ SOLN
INTRAMUSCULAR | Status: DC | PRN
  Administered 2021-06-05 (×6): 50 ug via INTRAVENOUS

## 2021-06-05 MED ORDER — HEPARIN SODIUM (PORCINE) 5000 UNIT/ML IJ SOLN
5000.0000 [IU] | Freq: Three times a day (TID) | INTRAMUSCULAR | Status: DC
  Administered 2021-06-05 – 2021-06-06 (×4): 5000 [IU] via SUBCUTANEOUS
  Filled 2021-06-05 (×4): qty 1

## 2021-06-05 MED ORDER — LIDOCAINE HCL 2 % IJ SOLN
INTRAMUSCULAR | Status: AC
  Filled 2021-06-05: qty 20

## 2021-06-05 MED ORDER — ONDANSETRON HCL 4 MG/2ML IJ SOLN: 4.0000 mg | INTRAMUSCULAR | Status: DC | PRN

## 2021-06-05 MED ORDER — LIDOCAINE HCL (PF) 2 % IJ SOLN
INTRAMUSCULAR | Status: DC | PRN
Start: 2021-06-05 — End: 2021-06-05
  Administered 2021-06-05: 1.5 mg/kg/h via INTRADERMAL

## 2021-06-05 MED ORDER — PANTOPRAZOLE SODIUM 40 MG IV SOLR
40.0000 mg | Freq: Every day | INTRAVENOUS | Status: DC
  Administered 2021-06-05: 40 mg via INTRAVENOUS
  Filled 2021-06-05: qty 40

## 2021-06-05 MED ORDER — ACETAMINOPHEN 500 MG PO TABS
1000.0000 mg | ORAL_TABLET | ORAL | Status: AC
  Administered 2021-06-05: 1000 mg via ORAL
  Filled 2021-06-05: qty 2

## 2021-06-05 MED ORDER — CHLORHEXIDINE GLUCONATE 0.12 % MT SOLN
15.0000 mL | Freq: Once | OROMUCOSAL | Status: AC
  Administered 2021-06-05: 15 mL via OROMUCOSAL

## 2021-06-05 MED ORDER — EPHEDRINE SULFATE-NACL 50-0.9 MG/10ML-% IV SOSY
PREFILLED_SYRINGE | INTRAVENOUS | Status: DC | PRN
  Administered 2021-06-05: 5 mg via INTRAVENOUS

## 2021-06-05 MED ORDER — KETAMINE HCL 10 MG/ML IJ SOLN
INTRAMUSCULAR | Status: AC
  Filled 2021-06-05: qty 1

## 2021-06-05 MED ORDER — MORPHINE SULFATE (PF) 4 MG/ML IV SOLN
INTRAVENOUS | Status: AC
  Filled 2021-06-05: qty 1

## 2021-06-05 MED ORDER — SODIUM CHLORIDE 0.9 % IV SOLN
2.0000 g | INTRAVENOUS | Status: AC
  Administered 2021-06-05: 2 g via INTRAVENOUS
  Filled 2021-06-05: qty 2

## 2021-06-05 MED ORDER — AMLODIPINE BESYLATE 10 MG PO TABS
10.0000 mg | ORAL_TABLET | Freq: Every morning | ORAL | Status: DC
  Administered 2021-06-06: 10 mg via ORAL
  Filled 2021-06-05: qty 1

## 2021-06-05 MED ORDER — HEPARIN SODIUM (PORCINE) 5000 UNIT/ML IJ SOLN
5000.0000 [IU] | INTRAMUSCULAR | Status: AC
  Administered 2021-06-05: 5000 [IU] via SUBCUTANEOUS
  Filled 2021-06-05: qty 1

## 2021-06-05 MED ORDER — OXYCODONE HCL 5 MG PO TABS: 5.0000 mg | ORAL_TABLET | Freq: Once | ORAL | Status: DC | PRN

## 2021-06-05 MED ORDER — LACTATED RINGERS IV SOLN: INTRAVENOUS | Status: DC

## 2021-06-05 MED ORDER — ROCURONIUM BROMIDE 10 MG/ML (PF) SYRINGE
PREFILLED_SYRINGE | INTRAVENOUS | Status: DC | PRN
  Administered 2021-06-05: 80 mg via INTRAVENOUS
  Administered 2021-06-05: 20 mg via INTRAVENOUS

## 2021-06-05 MED ORDER — DULOXETINE HCL 60 MG PO CPEP
60.0000 mg | ORAL_CAPSULE | Freq: Every day | ORAL | Status: DC
  Administered 2021-06-06: 60 mg via ORAL
  Filled 2021-06-05: qty 1

## 2021-06-05 MED ORDER — MORPHINE SULFATE (PF) 4 MG/ML IV SOLN
1.0000 mg | INTRAVENOUS | Status: DC | PRN
  Administered 2021-06-05: 3 mg via INTRAVENOUS
  Administered 2021-06-05: 2 mg via INTRAVENOUS
  Filled 2021-06-05: qty 1

## 2021-06-05 MED ORDER — BUPIVACAINE LIPOSOME 1.3 % IJ SUSP: 20.0000 mL | Freq: Once | INTRAMUSCULAR | Status: DC

## 2021-06-05 MED ORDER — PROPOFOL 500 MG/50ML IV EMUL
INTRAVENOUS | Status: AC
  Filled 2021-06-05: qty 50

## 2021-06-05 MED ORDER — KETAMINE HCL 10 MG/ML IJ SOLN
INTRAMUSCULAR | Status: DC | PRN
  Administered 2021-06-05: 40 mg via INTRAVENOUS
  Administered 2021-06-05: 10 mg via INTRAVENOUS

## 2021-06-05 MED ORDER — ACETAMINOPHEN 500 MG PO TABS
1000.0000 mg | ORAL_TABLET | Freq: Three times a day (TID) | ORAL | Status: DC
  Administered 2021-06-05 – 2021-06-06 (×4): 1000 mg via ORAL
  Filled 2021-06-05 (×4): qty 2

## 2021-06-05 SURGICAL SUPPLY — 63 items
APPLIER CLIP 5 13 M/L LIGAMAX5 (MISCELLANEOUS)
APPLIER CLIP ROT 10 11.4 M/L (STAPLE)
BLADE SURG 15 STRL LF DISP TIS (BLADE) ×2 IMPLANT
BLADE SURG 15 STRL SS (BLADE) ×1
CANNULA REDUC XI 12-8 STAPL (CANNULA) ×1
CANNULA REDUCER 12-8 DVNC XI (CANNULA) ×2 IMPLANT
CHLORAPREP W/TINT 26 (MISCELLANEOUS) ×3 IMPLANT
CLIP APPLIE 5 13 M/L LIGAMAX5 (MISCELLANEOUS) IMPLANT
CLIP APPLIE ROT 10 11.4 M/L (STAPLE) IMPLANT
COVER SURGICAL LIGHT HANDLE (MISCELLANEOUS) ×3 IMPLANT
DECANTER SPIKE VIAL GLASS SM (MISCELLANEOUS) ×3 IMPLANT
DERMABOND ADVANCED (GAUZE/BANDAGES/DRESSINGS) ×1
DERMABOND ADVANCED .7 DNX12 (GAUZE/BANDAGES/DRESSINGS) ×2 IMPLANT
DRAPE ARM DVNC X/XI (DISPOSABLE) ×8 IMPLANT
DRAPE COLUMN DVNC XI (DISPOSABLE) ×2 IMPLANT
DRAPE DA VINCI XI ARM (DISPOSABLE) ×4
DRAPE DA VINCI XI COLUMN (DISPOSABLE) ×1
ELECT REM PT RETURN 15FT ADLT (MISCELLANEOUS) ×3 IMPLANT
GLOVE SURG ENC MOIS LTX SZ8 (GLOVE) ×6 IMPLANT
GOWN STRL REUS W/TWL XL LVL3 (GOWN DISPOSABLE) ×9 IMPLANT
GRASPER SUT TROCAR 14GX15 (MISCELLANEOUS) ×3 IMPLANT
IRRIG SUCT STRYKERFLOW 2 WTIP (MISCELLANEOUS) ×3
IRRIGATION SUCT STRKRFLW 2 WTP (MISCELLANEOUS) ×2 IMPLANT
KIT BASIN OR (CUSTOM PROCEDURE TRAY) ×3 IMPLANT
KIT TURNOVER KIT A (KITS) IMPLANT
LUBRICANT JELLY K Y 4OZ (MISCELLANEOUS) IMPLANT
MARKER SKIN DUAL TIP RULER LAB (MISCELLANEOUS) ×3 IMPLANT
MAT PREVALON FULL STRYKER (MISCELLANEOUS) ×3 IMPLANT
NEEDLE SPNL 22GX3.5 QUINCKE BK (NEEDLE) ×3 IMPLANT
OBTURATOR OPTICAL STANDARD 8MM (TROCAR) ×1
OBTURATOR OPTICAL STND 8 DVNC (TROCAR) ×2
OBTURATOR OPTICALSTD 8 DVNC (TROCAR) ×2 IMPLANT
PACK CARDIOVASCULAR III (CUSTOM PROCEDURE TRAY) ×3 IMPLANT
RELOAD STAPLER 2.5X60 WHT DVNC (STAPLE) ×10 IMPLANT
RELOAD STAPLER 3.5X60 BLU DVNC (STAPLE) ×2 IMPLANT
SCISSORS LAP 5X35 DISP (ENDOMECHANICALS) IMPLANT
SEAL CANN UNIV 5-8 DVNC XI (MISCELLANEOUS) ×6 IMPLANT
SEAL XI 5MM-8MM UNIVERSAL (MISCELLANEOUS) ×3
SEALER VESSEL DA VINCI XI (MISCELLANEOUS) ×1
SEALER VESSEL EXT DVNC XI (MISCELLANEOUS) ×2 IMPLANT
SLEEVE GASTRECTOMY 36FR VISIGI (MISCELLANEOUS) ×3 IMPLANT
SOL ANTI FOG 6CC (MISCELLANEOUS) ×2 IMPLANT
SOLUTION ANTI FOG 6CC (MISCELLANEOUS) ×1
SOLUTION ELECTROLUBE (MISCELLANEOUS) ×3 IMPLANT
SPONGE T-LAP 18X18 ~~LOC~~+RFID (SPONGE) ×3 IMPLANT
STAPLER 60 DA VINCI SURE FORM (STAPLE) ×1
STAPLER 60 SUREFORM DVNC (STAPLE) ×2 IMPLANT
STAPLER CANNULA SEAL DVNC XI (STAPLE) ×2 IMPLANT
STAPLER CANNULA SEAL XI (STAPLE) ×1
STAPLER RELOAD 2.5X60 WHITE (STAPLE) ×5
STAPLER RELOAD 2.5X60 WHT DVNC (STAPLE) ×10
STAPLER RELOAD 3.5X60 BLU DVNC (STAPLE) ×2
STAPLER RELOAD 3.5X60 BLUE (STAPLE) ×1
SUT ETHIBOND 0 36 GRN (SUTURE) IMPLANT
SUT MNCRL AB 4-0 PS2 18 (SUTURE) ×6 IMPLANT
SUT VICRYL 0 TIES 12 18 (SUTURE) ×3 IMPLANT
SYR 10ML ECCENTRIC (SYRINGE) IMPLANT
SYR 20ML LL LF (SYRINGE) ×3 IMPLANT
TOWEL OR 17X26 10 PK STRL BLUE (TOWEL DISPOSABLE) ×3 IMPLANT
TROCAR ADV FIXATION 5X100MM (TROCAR) IMPLANT
TROCAR BLADELESS OPT 5 100 (ENDOMECHANICALS) ×3 IMPLANT
TUBE CALIBRATION LAPBAND (TUBING) IMPLANT
TUBING INSUFFLATION 10FT LAP (TUBING) ×3 IMPLANT

## 2021-06-05 NOTE — Anesthesia Procedure Notes (Signed)
Procedure Name: Intubation Date/Time: 06/05/2021 7:36 AM Performed by: Claudia Desanctis, CRNA Pre-anesthesia Checklist: Patient identified, Emergency Drugs available, Suction available and Patient being monitored Patient Re-evaluated:Patient Re-evaluated prior to induction Oxygen Delivery Method: Circle system utilized Preoxygenation: Pre-oxygenation with 100% oxygen Induction Type: IV induction, Rapid sequence and Cricoid Pressure applied Laryngoscope Size: 2 and Miller Grade View: Grade I Tube type: Oral Tube size: 8.0 mm Number of attempts: 1 Airway Equipment and Method: Stylet Placement Confirmation: ETT inserted through vocal cords under direct vision, positive ETCO2 and breath sounds checked- equal and bilateral Secured at: 22 cm Tube secured with: Tape Dental Injury: Teeth and Oropharynx as per pre-operative assessment

## 2021-06-05 NOTE — Interval H&P Note (Signed)
History and Physical Interval Note:  06/05/2021 7:20 AM  [name]  has presented today for surgery, with the diagnosis of MORBID OBESITY.  The various methods of treatment have been discussed with the patient and family. After consideration of risks, benefits and other options for treatment, the patient has consented to  Procedure(s): XI ROBOTIC GASTRIC SLEEVE RESECTION (N/A) UPPER GI ENDOSCOPY (N/A) as a surgical intervention.  The patient's history has been reviewed, patient examined, no change in status, stable for surgery.  I have reviewed the patient's chart and labs.  Questions were answered to the patient's satisfaction.     Tyler Gross

## 2021-06-05 NOTE — Progress Notes (Signed)

## 2021-06-05 NOTE — Op Note (Signed)
   Surgeon: Kaylyn Lim, MD, FACS  Asst:  Gurney Maxin, MD, FACS 05 June 2021 Anes:  General endotracheal  Procedure: Robotic sleeve gastrectomy and upper endoscopy  Diagnosis: Morbid obesity  Complications: None noted  EBL:   10   cc  Description of Procedure:  The patient was take to OR 2 and given general anesthesia.  The abdomen was prepped with Chloroprep and draped sterilely.  A timeout was performed.  Access to the abdomen was achieved with a 5 mm Optiview in the left upper quadrant.  A 12 mm robotic trocar was placed to the right of the midline and then three 8 mm trocars were placed to the left of the midline.  Dr. Kieth Brightly has a 5 mm trocar in the LLQ and the Sherrie Sport was placed in the upper midline.  Following insufflation, the state of the abdomen was found to be free of adhesions.  The calibration tube was inserted and no hernia was seen.  The ViSiGi 36Fr tube was inserted to deflate the stomach and was pulled back into the esophagus.   The pylorus was identified and we measured 5 cm back and marked the antrum.  At that point we began dissection to take down the greater curvature of the stomach using the vessel sealer.  This dissection was taken all the way up to the left crus.  Posterior attachments of the stomach were also taken down.    The ViSiGi tube was then passed into the antrum and suction applied so that it was snug along the lessor curvature.  The "crow's foot" or incisura was identified.  The sleeve gastrectomy was begun using the Sureform platform stapler beginning with a blue load followed by white loads.  M.  When the sleeve was complete the tube was taken off suction and insufflated briefly.  The tube was withdrawn.  Upper endoscopy was then performed by Dr. Hassell Done and this showed the cylindrical contour with no bleeding or bubbles.     The specimen was extracted through the 12 trocar site.  Local block was provided by infiltrating abdomen as a TAP block  and then closed 4-0 Monocryl and Dermabond.    Matt B. Hassell Done, [place], [facility], [place]

## 2021-06-05 NOTE — Progress Notes (Signed)
PHARMACY CONSULT FOR:  Risk Assessment for Post-Discharge VTE Following Bariatric Surgery  Post-Discharge VTE Risk Assessment: This patient's probability of 30-day post-discharge VTE is increased due to the factors marked: X  Male    Age >/=60 years    BMI >/=50 kg/m2    CHF    Dyspnea at Rest    Paraplegia  X  Non-gastric-band surgery    Operation Time >/=3 hr    Return to OR     Length of Stay >/= 3 d   Hx of VTE   Hypercoagulable condition   Significant venous stasis       Predicted probability of 30-day post-discharge VTE: 0.31  Other patient-specific factors to consider: none  Recommendation for Discharge: No pharmacologic prophylaxis post-discharge  Tyler Gross is a 45 y.o. male who underwent Robotic sleeve gastrectomy and upper endoscopy 06/05/2021    Allergies  Allergen Reactions   Atorvastatin Other (See Comments)    Muscle fatigue   Bee Venom Swelling   Sulfamethoxazole-Trimethoprim Hives    Bactrim     Patient Measurements: Height: 6' (182.9 cm) Weight: 119 kg (262 lb 6.4 oz) IBW/kg (Calculated) : 77.6 Body mass index is 35.59 kg/m.  Recent Labs    06/05/21 1059  WBC 13.1*  HGB 13.6  HCT 42.3  PLT 261  CREATININE 1.26*   Estimated Creatinine Clearance: 97.6 mL/min (A) (by C-G formula based on SCr of 1.26 mg/dL (H)).    Past Medical History:  Diagnosis Date   Acne    on back taking doxycycline for   Alcoholism [facility])    sober since 04/2018   Anxiety    Depression    DM type 2 (diabetes mellitus, type 2) ([place])    ED (erectile dysfunction)    GAD (generalized anxiety disorder)    GERD (gastroesophageal reflux disease)    Gynecomastia, male    followed by dr Shellia Cleverly   High serum estradiol    resolved   History of anal fissures    History of kidney stones    History of small bowel obstruction 2004   Hypertension    IBS (irritable bowel syndrome)    Mixed hyperlipidemia    OSA on CPAP    per last study 07-31-2013  severe osa    Primary hypogonadism in male    endocrinologist-  dr Shellia Cleverly     Medications Prior to Admission  Medication Sig Dispense Refill Last Dose   amLODipine (NORVASC) 10 MG tablet Take 1 tablet (10 mg total) by mouth every morning. 90 tablet 2 06/05/2021 at 0415   anastrozole (ARIMIDEX) 1 MG tablet TAKE ONE TABLET BY MOUTH DAILY 90 tablet 3 06/05/2021 at 0415   Calcium Carbonate-Vitamin D (CALCIUM-VITAMIN D) 600-125 MG-UNIT TABS Take 1 tablet by mouth daily.   Past Week   clomiPHENE (CLOMID) 50 MG tablet TAKE 1/2 TABLET BY MOUTH EVERY OTHER DAY (Patient taking differently: Take 25 mg by mouth every Monday, Wednesday, and Friday.) 18 tablet 3 Past Week   CRANBERRY PO Take 1 capsule by mouth daily.   Past Week   DULoxetine (CYMBALTA) 60 MG capsule Take 1 capsule (60 mg total) by mouth daily. For mood control 30 capsule 0 06/05/2021 at 0415   gabapentin (NEURONTIN) 600 MG tablet Take 600 mg by mouth 2 (two) times daily.    06/05/2021 at 0415   icosapent Ethyl (VASCEPA) 1 g capsule Take 2 capsules (2 g total) by mouth 2 (two) times daily. 360 capsule 2  lisinopril-hydrochlorothiazide (ZESTORETIC) 20-25 MG tablet Take 1 tablet by mouth daily. 90 tablet 2 06/04/2021   metformin (FORTAMET) 500 MG (OSM) 24 hr tablet Take 500 mg by mouth 2 (two) times daily.   06/04/2021   Multiple Vitamin (MULTIVITAMIN WITH MINERALS) TABS tablet Take 1 tablet by mouth daily.   06/04/2021   Omega-3 Fatty Acids (FISH OIL) 1000 MG CAPS Take 1,000 mg by mouth daily.   06/04/2021   pantoprazole (PROTONIX) 40 MG tablet Take 1 tablet (40 mg total) by mouth 2 (two) times daily. 60 tablet 5 06/05/2021 at 0415   psyllium (REGULOID) 0.52 g capsule Take 1.04 g by mouth daily.   06/04/2021   rosuvastatin (CRESTOR) 10 MG tablet Take 1 tablet (10 mg total) by mouth at bedtime. 90 tablet 1 06/04/2021   Semaglutide, 1 MG/DOSE, 4 MG/3ML SOPN Inject 1 mg as directed once a week. (Patient taking differently: Inject 1 mg as directed once a  week. Monday) 3 mL 3    vitamin B-12 (CYANOCOBALAMIN) 500 MCG tablet Take 500 mcg by mouth daily.   06/04/2021   CREON 36000-114000 units CPEP capsule TAKE TWO CAPSULES BY MOUTH THREE TIMES A DAY BEFORE MEALS AND ONE CAPSULE WITH SNACKS (Patient not taking: No sig reported) 210 capsule 9 Not Taking   metoprolol tartrate (LOPRESSOR) 100 MG tablet Take 1 tablet (100 mg total) by mouth once for 1 dose. 2 hours prior to CTA (Patient not taking: Reported on 05/16/2021) 1 tablet 0 Not Taking    Eudelia Bunch, Pharm.D [phone] 06/05/2021 12:11 PM

## 2021-06-05 NOTE — Anesthesia Postprocedure Evaluation (Signed)
Anesthesia Post Note  Patient: Tyler Gross  Procedure(s) Performed: XI ROBOTIC GASTRIC SLEEVE RESECTION (Abdomen) UPPER GI ENDOSCOPY (Esophagus)     Patient location during evaluation: PACU Anesthesia Type: General Level of consciousness: awake and alert, oriented and patient cooperative Pain management: pain level controlled Vital Signs Assessment: post-procedure vital signs reviewed and stable Respiratory status: spontaneous breathing, nonlabored ventilation and respiratory function stable Cardiovascular status: blood pressure returned to baseline and stable Postop Assessment: no apparent nausea or vomiting Anesthetic complications: no   No notable events documented.  Last Vitals:  Vitals:   06/05/21 1045 06/05/21 1100  BP: 138/82 (!) 132/91  Pulse: 82 87  Resp: 17 12  Temp:    SpO2: 92% 90%    Last Pain:  Vitals:   06/05/21 1045  TempSrc:   PainSc: [name]

## 2021-06-05 NOTE — Transfer of Care (Signed)
Immediate Anesthesia Transfer of Care Note  Patient: Tyler Gross  Procedure(s) Performed: XI ROBOTIC GASTRIC SLEEVE RESECTION (Abdomen) UPPER GI ENDOSCOPY (Esophagus)  Patient Location: PACU  Anesthesia Type:General  Level of Consciousness: awake and patient cooperative  Airway & Oxygen Therapy: Patient Spontanous Breathing and Patient connected to face mask  Post-op Assessment: Report given to RN and Post -op Vital signs reviewed and stable  Post vital signs: Reviewed and stable  Last Vitals:  Vitals Value Taken Time  BP 139/89 06/05/21 1005  Temp    Pulse 91 06/05/21 1007  Resp 20 06/05/21 1007  SpO2 94 % 06/05/21 1007  Vitals shown include unvalidated device data.  Last Pain:  Vitals:   06/05/21 0611  TempSrc:   PainSc: 0-No pain         Complications: No notable events documented.

## 2021-06-05 NOTE — Progress Notes (Signed)
Started water at 1410 and incentive spirometer at 1410.

## 2021-06-06 ENCOUNTER — Encounter (HOSPITAL_COMMUNITY): Payer: Self-pay | Admitting: Surgery

## 2021-06-06 ENCOUNTER — Other Ambulatory Visit (HOSPITAL_COMMUNITY): Payer: Self-pay

## 2021-06-06 LAB — CBC WITH DIFFERENTIAL/PLATELET
Abs Immature Granulocytes: 0.06 10*3/uL (ref 0.00–0.07)
Basophils Absolute: 0 10*3/uL (ref 0.0–0.1)
Basophils Relative: 0 %
Eosinophils Absolute: 0 10*3/uL (ref 0.0–0.5)
Eosinophils Relative: 0 %
HCT: 41 % (ref 39.0–52.0)
Hemoglobin: 13.1 g/dL (ref 13.0–17.0)
Immature Granulocytes: 1 %
Lymphocytes Relative: 20 %
Lymphs Abs: 2.4 10*3/uL (ref 0.7–4.0)
MCH: 28.2 pg (ref 26.0–34.0)
MCHC: 32 g/dL (ref 30.0–36.0)
MCV: 88.4 fL (ref 80.0–100.0)
Monocytes Absolute: 0.9 10*3/uL (ref 0.1–1.0)
Monocytes Relative: 7 %
Neutro Abs: 8.8 10*3/uL — ABNORMAL HIGH (ref 1.7–7.7)
Neutrophils Relative %: 72 %
Platelets: 287 10*3/uL (ref 150–400)
RBC: 4.64 MIL/uL (ref 4.22–5.81)
RDW: 13.6 % (ref 11.5–15.5)
WBC: 12.2 10*3/uL — ABNORMAL HIGH (ref 4.0–10.5)
nRBC: 0 % (ref 0.0–0.2)

## 2021-06-06 LAB — SURGICAL PATHOLOGY

## 2021-06-06 MED ORDER — ONDANSETRON 4 MG PO TBDP
4.0000 mg | ORAL_TABLET | Freq: Four times a day (QID) | ORAL | 0 refills | Status: DC | PRN
  Filled 2021-06-06: qty 20, 5d supply, fill #0

## 2021-06-06 MED ORDER — OXYCODONE HCL 5 MG PO TABS
5.0000 mg | ORAL_TABLET | Freq: Four times a day (QID) | ORAL | 0 refills | Status: DC | PRN
  Filled 2021-06-06: qty 10, 3d supply, fill #0

## 2021-06-06 NOTE — Progress Notes (Signed)
Inpatient Diabetes Program Recommendations  AACE/ADA: New Consensus Statement on Inpatient Glycemic Control (2015)  Target Ranges:  Prepandial:   less than 140 mg/dL      Peak postprandial:   less than 180 mg/dL (1-2 hours)      Critically ill patients:  140 - 180 mg/dL   Lab Results  Component Value Date   GLUCAP 169 (H) 06/05/2021   HGBA1C 6.8 (H) 05/23/2021    Review of Glycemic Control  Diabetes history: DM2 Outpatient Diabetes medications: metformin 500 mg BID, Ozempic 1.0 mg weekly on Mon. Current orders for Inpatient glycemic control: None  HgbA1C - 6.8%  Inpatient Diabetes Program Recommendations:    For home: D/C metformin 500 mg BID Monitor blood sugars at least 3-4x/day. F/U with PCP regarding glycemic control.  Spoke with pt at bedside regarding diabetes control at home. D/C metformin and increase monitoring of blood sugars at least 3-4x/day. Discussed hyper and hypoglycemia s/s and treatment. Answered questions.  Thank you. Lorenda Peck, RD, LDN, CDE Inpatient Diabetes Coordinator [phone]

## 2021-06-06 NOTE — Discharge Summary (Signed)
Physician Discharge Summary  Patient ID: Tyler Gross MRN: [id number] DOB/AGE: June 24, 1976 45 y.o.  PCP: Luetta Nutting, DO  Admit date: 06/05/2021 Discharge date: 06/06/2021  Admission Diagnoses:  morbid obesity  Discharge Diagnoses:  same  Principal Problem:   S/P laparoscopic sleeve gastrectomyOctober 2022   Surgery:  Xi Robotic sleeve gastrectomy  Discharged Condition: improved  Hospital Course:   Had surgery on Monday.  This included an endoscopy following his sleeve gastrectomy.  He did well and was begun on liquids and advanced to full liquids before discharge.    Consults: DM nurse  Significant Diagnostic Studies: none    Discharge Exam: Blood pressure (!) 142/92, pulse 89, temperature 98.3 F (36.8 C), temperature source Oral, resp. rate 19, height 6' (1.829 m), weight 119 kg, SpO2 96 %. Incisions healing OK  Disposition: Discharge disposition: 01-Home or Self Care       Discharge Instructions     Ambulate hourly while awake   Complete by: As directed    Call MD for:  difficulty breathing, headache or visual disturbances   Complete by: As directed    Call MD for:  persistant dizziness or light-headedness   Complete by: As directed    Call MD for:  persistant nausea and vomiting   Complete by: As directed    Call MD for:  redness, tenderness, or signs of infection (pain, swelling, redness, odor or green/yellow discharge around incision site)   Complete by: As directed    Call MD for:  severe uncontrolled pain   Complete by: As directed    Call MD for:  temperature >101 F   Complete by: As directed    Diet bariatric full liquid   Complete by: As directed    Incentive spirometry   Complete by: As directed    Perform hourly while awake      Allergies as of 06/06/2021       Reactions   Atorvastatin Other (See Comments)   Muscle fatigue   Bee Venom Swelling   Sulfamethoxazole-trimethoprim Hives   Bactrim         Medication List      STOP taking these medications    Creon 36000 UNITS Cpep capsule Generic drug: lipase/protease/amylase   metformin 500 MG (OSM) 24 hr tablet Commonly known as: FORTAMET   metoprolol tartrate 100 MG tablet Commonly known as: LOPRESSOR   Ozempic (1 MG/DOSE) 4 MG/3ML Sopn Generic drug: Semaglutide (1 MG/DOSE)   psyllium 0.52 g capsule Commonly known as: REGULOID       TAKE these medications    amLODipine 10 MG tablet Commonly known as: NORVASC Take 1 tablet (10 mg total) by mouth every morning. Notes to patient: Monitor Blood Pressure Daily and keep a log for primary care physician.  You may need to make changes to your medications with rapid weight loss.     anastrozole 1 MG tablet Commonly known as: ARIMIDEX TAKE ONE TABLET BY MOUTH DAILY   atenolol 100 MG tablet Commonly known as: TENORMIN TAKE ONE TABLET BY MOUTH EVERY MORNING   Calcium-Vitamin D 600-125 MG-UNIT Tabs Take 1 tablet by mouth daily.   clomiPHENE 50 MG tablet Commonly known as: CLOMID TAKE 1/2 TABLET BY MOUTH EVERY OTHER DAY What changed: when to take this   CRANBERRY PO Take 1 capsule by mouth daily.   DULoxetine 60 MG capsule Commonly known as: CYMBALTA Take 1 capsule (60 mg total) by mouth daily. For mood control   Fish Oil 1000 MG Caps  Take 1,000 mg by mouth daily.   gabapentin 600 MG tablet Commonly known as: NEURONTIN Take 600 mg by mouth 2 (two) times daily.   lisinopril-hydrochlorothiazide 20-25 MG tablet Commonly known as: ZESTORETIC Take 1 tablet by mouth daily. Notes to patient: Monitor Blood Pressure Daily and keep a log for primary care physician.  You may need to make changes to your medications with rapid weight loss.     multivitamin with minerals Tabs tablet Take 1 tablet by mouth daily.   ondansetron 4 MG disintegrating tablet Commonly known as: ZOFRAN-ODT Take 1 tablet (4 mg total) by mouth every 6 (six) hours as needed for nausea or vomiting.   oxyCODONE 5 MG  immediate release tablet Commonly known as: Oxy IR/ROXICODONE Take 1 tablet (5 mg total) by mouth every 6 (six) hours as needed for severe pain.   pantoprazole 40 MG tablet Commonly known as: PROTONIX Take 1 tablet (40 mg total) by mouth 2 (two) times daily.   potassium chloride 10 MEQ tablet Commonly known as: KLOR-CON TAKE ONE TABLET BY MOUTH DAILY   rosuvastatin 10 MG tablet Commonly known as: CRESTOR Take 1 tablet (10 mg total) by mouth at bedtime.   Vascepa 1 g capsule Generic drug: icosapent Ethyl Take 2 capsules (2 g total) by mouth 2 (two) times daily.   vitamin B-12 500 MCG tablet Commonly known as: CYANOCOBALAMIN Take 500 mcg by mouth daily.        Follow-up Information     Johnathan Hausen, MD. Go on 06/23/2021.   Specialty: General Surgery Why: at 2:30pm.  Please arrive 15 minutes prior to your appointment time.  Thank you. Contact information: [address] [place] [address] [phone]         Carlena Hurl, PA-C. Go on 07/27/2021.   Specialty: General Surgery Why: for Dr. Hassell Done at [facility].  Please arrive 15 minutes prior to your appointment time.  Thank you. Contact information: [place] [address] [phone]                 Signed: Pedro Earls 06/06/2021, 1:49 PM

## 2021-06-06 NOTE — Progress Notes (Signed)
Patient alert and oriented, pain is controlled. Patient is tolerating fluids, advanced to protein shake today, patient is tolerating well.  Reviewed Gastric sleeve discharge instructions with patient and patient is able to articulate understanding.  Provided information on BELT program, Support Group and [facility] outpatient pharmacy. All questions answered, will continue to monitor.  

## 2021-06-06 NOTE — Discharge Instructions (Signed)

## 2021-06-06 NOTE — Plan of Care (Signed)
Instructions were reviewed with patient. All questions were answered. Patient was transported to main entrance by wheelchair. ° °

## 2021-06-07 ENCOUNTER — Encounter: Payer: Self-pay | Admitting: *Deleted

## 2021-06-07 ENCOUNTER — Other Ambulatory Visit: Payer: Self-pay | Admitting: *Deleted

## 2021-06-07 NOTE — Progress Notes (Signed)
24hr fluid recall prior to discharge: 660mL. Per dehydration protocol, will call pt to f/u within one week post op. ?

## 2021-06-07 NOTE — Patient Outreach (Signed)
[facility]) Care Management  06/07/2021  Tyler Gross 1976/06/28 [id number]   Transition of care call/case closure   Referral received:05/26/21 Initial outreach:06/07/21 Insurance: [place] [employer]   Subjective: Initial successful telephone call to patient's preferred number in order to complete transition of care assessment; 2 HIPAA identifiers verified. Explained purpose of call and completed transition of care assessment.  Will states that he is doing okay, states that he did not  expect to have much pain, reports pain manageable with prn medications. denies post-operative problems, says surgical incisions are unremarkable. He reports progressing  and tolerating full liquid diet,so far 2 protein shakes in today and 30 ounces of liquids  he denies nausea. He reports no bowel movement yet, plans to begin Miralax on tomorrow report urine being light clear colore. Reinforced use of incentive spirometry hourly during the day he reports using .  Reinforced continued walking in home . He reports having support from family/partner in recovery.   Reviewed accessing the following [place] Benefits : He discussed ongoing health conditions hypertension, Diabetes and he  is enrolled in active health management program with health coach. Reinforced monitoring blood pressure and blood sugars and keeping a log to take to PCP visit. He is unsure if his has  the hospital indemnity, provided contact number to [employer] to file a claim if need.  He uses a [facility] outpatient pharmacy at [place] at [employer].    Objective:  Tyler Gross  was hospitalized at [facility] 10/31-11/1/22 for  Laparoscopic gastric sleeve resection . Comorbidities include: Obesity, Diabetes, A1c 6.8%, Hypertension.  He  was discharged to home on 06/06/21 without the need for home health services or DME.   Assessment:  Patient voices good understanding of all discharge  instructions.  See transition of care flowsheet for assessment details.   Plan:  Reviewed hospital discharge diagnosis of laparoscopic gastric sleeve resection  and discharge treatment plan using hospital discharge instructions, assessing medication adherence, reviewing problems requiring provider notification, and discussing the importance of follow up with surgeon, primary care provider and/or specialists as directed.  Reviewed [place] healthy lifestyle program information to earn healthy premium rate for  2024  :  Step 1: Create account at [email or web address] Step 2: Complete your health assessment and annual physical by September 1. 2023.  For questions call [phone]  Using Active Health Management ActiveAdvice View website, verified that patient is an active participate in [place] [facility] chronic disease management program.    No ongoing care management needs identified so will close case to [place] [facility] services and route successful outreach letter with [place] Management pamphlet and 24 Hour Nurse Line Magnet to [place] Management clinical pool to be mailed to patient's home address.  Thanked patient for their services to [facility].  Joylene Draft, RN, BSN  [place] Management Coordinator  [phone]- Mobile [phone]- Toll Free Main Office

## 2021-06-08 DIAGNOSIS — E291 Testicular hypofunction: Secondary | ICD-10-CM | POA: Diagnosis not present

## 2021-06-09 ENCOUNTER — Telehealth (HOSPITAL_COMMUNITY): Payer: Self-pay | Admitting: *Deleted

## 2021-06-09 NOTE — Telephone Encounter (Addendum)
1.  Tell me about your pain and pain management? Pt c/o right lower abdominal incisional pain with movement and exertion.  Pt states that he has been taking oxycodone. Discussed with patient to try and splint his abdomen with changing positions and to try a heating pad for no longer than 20 minutes at a time to assist with the discomfort.  Encouraged pt to try options and/or contact [facility] if still concerned.   2.  Let's talk about fluid intake.  How much total fluid are you taking in? Pt states that he is getting in at least 64oz of fluid including protein shakes and bottled water. Pt instructed to assess status and suggestions daily utilizing Hydration Action Plan on discharge folder and to call [facility] if in the "red zone".   3.  How much protein have you taken in the last 2 days? Pt states he is meeting his goal of 80g of protein each day with the protein shakes.  4.  Have you had nausea?  Tell me about when have experienced nausea and what you did to help? Pt denies nausea.   5.  Has the frequency or color changed with your urine? Pt states that he is urinating "fine" with no changes in frequency or urgency.     6.  Tell me what your incisions look like? "Incisions look fine". Pt denies a fever, chills.  Pt states incisions are not swollen, open, or draining.  Pt encouraged to call [facility] if incisions change.   7.  Have you been passing gas? BM? Pt states that he is having BMs. Last BM 06/09/21.   8.  If a problem or question were to arise who would you call?  Do you know contact numbers for [place], [facility], and [facility]? Pt denies dehydration symptoms.  Pt can describe s/sx of dehydration.  Pt knows to call [facility] for surgical, [facility] for nutrition, and [facility] for non-urgent questions or concerns.   9.  How has the walking going? Pt states he is walking around and able to be active without difficulty.   10. Are you still using your incentive spirometer?  If so, how often? Pt states that he is doing I.S. about  5x/hour. Pt encouraged to use incentive spirometer, at least 10x every hour while awake until he sees the surgeon.  11.  How are your vitamins and calcium going?  How are you taking them? Pt states that he is taking his supplements and vitamins without difficulty.   Reminded patient that the first 30 days post-operatively are important for successful recovery.  Practice good hand hygiene, wearing a mask when appropriate (since optional in most places), and minimizing exposure to people who live outside of the home, especially if they are exhibiting any respiratory, GI, or illness-like symptoms.

## 2021-06-14 DIAGNOSIS — N522 Drug-induced erectile dysfunction: Secondary | ICD-10-CM | POA: Diagnosis not present

## 2021-06-14 DIAGNOSIS — E291 Testicular hypofunction: Secondary | ICD-10-CM | POA: Diagnosis not present

## 2021-06-15 DIAGNOSIS — L918 Other hypertrophic disorders of the skin: Secondary | ICD-10-CM | POA: Diagnosis not present

## 2021-06-15 DIAGNOSIS — L821 Other seborrheic keratosis: Secondary | ICD-10-CM | POA: Diagnosis not present

## 2021-06-15 DIAGNOSIS — L720 Epidermal cyst: Secondary | ICD-10-CM | POA: Diagnosis not present

## 2021-06-16 ENCOUNTER — Encounter: Payer: Self-pay | Admitting: Family Medicine

## 2021-06-16 ENCOUNTER — Other Ambulatory Visit: Payer: Self-pay

## 2021-06-16 ENCOUNTER — Ambulatory Visit: Payer: 59 | Admitting: Family Medicine

## 2021-06-16 DIAGNOSIS — E119 Type 2 diabetes mellitus without complications: Secondary | ICD-10-CM

## 2021-06-16 DIAGNOSIS — I10 Essential (primary) hypertension: Secondary | ICD-10-CM | POA: Diagnosis not present

## 2021-06-16 DIAGNOSIS — Z9884 Bariatric surgery status: Secondary | ICD-10-CM | POA: Diagnosis not present

## 2021-06-18 NOTE — Assessment & Plan Note (Signed)
He is doing pretty well postoperatively.  Struggling a bit with diet but doing okay with liquid/high-protein intake.  He is looking forward to advancing diet

## 2021-06-18 NOTE — Assessment & Plan Note (Signed)
Blood pressure remains well controlled with current medications.  I do anticipate will be able to reduce some of these in the future as he loses weight.

## 2021-06-18 NOTE — Assessment & Plan Note (Signed)
Blood sugars have been better controlled postoperatively.  He is no longer taking metformin or Ozempic.  He will continue to monitor this at home.

## 2021-06-18 NOTE — Progress Notes (Signed)
Tyler Gross - 45 y.o. male MRN [id number]  Date of birth: May 24, 1976  Subjective Chief Complaint  Patient presents with   Hospitalization Follow-up    HPI Well is a 45 year old male here today for follow-up.  Recently had gastric sleeve resection is following up today.  Reports overall he is feeling well.  He is not having any significant pain, bloating or nausea.  He denies fever or respiratory symptoms.  Incisions seem to be healing well.  He is following a liquid diet and with high-protein intake.  Metformin and Ozempic discontinued postoperatively.  ROS:  A comprehensive ROS was completed and negative except as noted per HPI  Allergies  Allergen Reactions   Atorvastatin Other (See Comments)    Muscle fatigue   Bee Venom Swelling   Sulfamethoxazole-Trimethoprim Hives    Bactrim     Past Medical History:  Diagnosis Date   Acne    on back taking doxycycline for   Alcoholism [facility])    sober since 04/2018   Anxiety    Depression    DM type 2 (diabetes mellitus, type 2) ([place])    ED (erectile dysfunction)    GAD (generalized anxiety disorder)    GERD (gastroesophageal reflux disease)    Gynecomastia, male    followed by dr Shellia Cleverly   High serum estradiol    resolved   History of anal fissures    History of kidney stones    History of small bowel obstruction 2004   Hypertension    IBS (irritable bowel syndrome)    Mixed hyperlipidemia    OSA on CPAP    per last study 07-31-2013  severe osa   Primary hypogonadism in male    endocrinologist-  dr Shellia Cleverly    Past Surgical History:  Procedure Laterality Date   CARDIOVASCULAR STRESS TEST  10/05/2009   normal nuclear study w/ no ischemia/  normal LV function and wall motion , ef 71%   COLONOSCOPY  last one   2004, april 2021   EVALUATION UNDER ANESTHESIA WITH ANAL FISTULECTOMY N/A 02/13/2018   Procedure: ANAL EXAM UNDER ANESTHESIA WITH BIOPSY;  Surgeon: [name], MD;  Location: [place];   Service: General;  Laterality: N/A;   ORCHIECTOMY Left 1991   w/ placement prosthesis (for torsion)   TESTICULAR EXPLORATION Left 01/29/2020   Procedure: EXCHANGE OF LEFT TESTICULAR PROSTHESIS;  Surgeon: Cleon Gustin, MD;  Location: [facility];  Service: Urology;  Laterality: Left;   UPPER GI ENDOSCOPY N/A 06/05/2021   Procedure: UPPER GI ENDOSCOPY;  Surgeon: Johnathan Hausen, MD;  Location: [facility];  Service: General;  Laterality: N/A;   URETEROLITHOTOMY  1999    Social History   Socioeconomic History   Marital status: Single    Spouse name: Not on file   Number of children: Not on file   Years of education: Not on file   Highest education level: Not on file  Occupational History   Occupation: RN   Tobacco Use   Smoking status: Former    Packs/day: 1.00    Years: 10.00    Pack years: 10.00    Types: Cigarettes    Quit date: 02/07/2009    Years since quitting: 12.3   Smokeless tobacco: Never  Vaping Use   Vaping Use: Never used  Substance and Sexual Activity   Alcohol use: Not Currently   Drug use: No   Sexual activity: Yes    Partners: Male  Other Topics Concern  Not on file  Social History Narrative   Lives with partner   Regular exercise: no   Caffeine use: 1 large cup of coffee daily; 2 to 3 sodas in the evening   Social Determinants of Health   Financial Resource Strain: Not on file  Food Insecurity: Not on file  Transportation Needs: Not on file  Physical Activity: Not on file  Stress: Not on file  Social Connections: Not on file    Family History  Problem Relation Age of Onset   Cancer Mother        breast cancer: stage I   Hypertension Mother    Irritable bowel syndrome Mother    Heart disease Maternal Grandfather    Diabetes Paternal Grandmother    Colon cancer Maternal Aunt        great aunt   Esophageal cancer Neg Hx    Rectal cancer Neg Hx    Stomach cancer Neg Hx     Health Maintenance  Topic Date Due    OPHTHALMOLOGY EXAM  Never done   Hepatitis C Screening  Never done   Pneumococcal Vaccine 62-28 Years old (2 - PCV) 02/17/2020   COVID-19 Vaccine (4 - Booster) 07/01/2020   HEMOGLOBIN A1C  11/21/2021   FOOT EXAM  11/25/2021   TETANUS/TDAP  01/27/2024   COLONOSCOPY (Pts 45-73yrs Insurance coverage will need to be confirmed)  10/07/2029   INFLUENZA VACCINE  Completed   HIV Screening  Completed   HPV VACCINES  Aged Out     ----------------------------------------------------------------------------------------------------------------------------------------------------------------------------------------------------------------- Physical Exam BP 121/69 (BP Location: Left Arm, Patient Position: Sitting, Cuff Size: Large)   Pulse 88   Ht 6' (1.829 m)   Wt 248 lb (112.5 kg)   SpO2 96%   BMI 33.63 kg/m   Physical Exam Constitutional:      Appearance: Normal appearance.  HENT:     Head: Normocephalic and atraumatic.  Eyes:     General: No scleral icterus. Cardiovascular:     Rate and Rhythm: Normal rate and regular rhythm.  Pulmonary:     Effort: Pulmonary effort is normal.     Breath sounds: Normal breath sounds.  Abdominal:     General: There is no distension.     Palpations: Abdomen is soft.     Tenderness: There is no abdominal tenderness.  Musculoskeletal:     Cervical back: Neck supple.  Skin:    Comments: Incisions CDI  Neurological:     Mental Status: He is alert.  Psychiatric:        Mood and Affect: Mood normal.        Behavior: Behavior normal.    ------------------------------------------------------------------------------------------------------------------------------------------------------------------------------------------------------------------- Assessment and Plan  Type 2 diabetes mellitus without complication, without long-term current use of insulin (HCC) Blood sugars have been better controlled postoperatively.  He is no longer taking metformin  or Ozempic.  He will continue to monitor this at home.  Essential hypertension Blood pressure remains well controlled with current medications.  I do anticipate will be able to reduce some of these in the future as he loses weight.  S/P laparoscopic sleeve gastrectomyOctober 2022 He is doing pretty well postoperatively.  Struggling a bit with diet but doing okay with liquid/high-protein intake.  He is looking forward to advancing diet   No orders of the defined types were placed in this encounter.   Return in about 4 months (around 10/14/2021) for HTN/DM.    This visit occurred during the SARS-CoV-2 public health emergency.  Safety protocols were  in place, including screening questions prior to the visit, additional usage of staff PPE, and extensive cleaning of exam room while observing appropriate contact time as indicated for disinfecting solutions.

## 2021-06-20 ENCOUNTER — Other Ambulatory Visit: Payer: Self-pay

## 2021-06-20 ENCOUNTER — Encounter: Payer: 59 | Attending: Surgery | Admitting: Skilled Nursing Facility1

## 2021-06-20 DIAGNOSIS — E119 Type 2 diabetes mellitus without complications: Secondary | ICD-10-CM | POA: Diagnosis not present

## 2021-06-21 NOTE — Progress Notes (Signed)
2 Week Post-Operative Nutrition Class   Patient was seen on 06/20/2021 for Post-Operative Nutrition education at the [facility].    Surgery date: 06/05/2021 Surgery type: sleeve Start weight at [facility]: 277.5 pounds Weight today: 274.3 pounds Bowel Habits: Every day to every other day no complaints   Body Composition Scale 06/20/2021  Current Body Weight 247.8  Total Body Fat % 28.8  Visceral Fat 19  Fat-Free Mass % 71.1   Total Body Water % 52.1  Muscle-Mass lbs 46.6  BMI 33.6  Body Fat Displacement          Torso  lbs 44.2         Left Leg  lbs 8.8         Right Leg  lbs 8.8         Left Arm  lbs 4.4         Right Arm   lbs 4.4      The following the learning objectives were met by the patient during this course: Identifies Phase 3 (Soft, High Proteins) Dietary Goals and will begin from 2 weeks post-operatively to 2 months post-operatively Identifies appropriate sources of fluids and proteins  Identifies appropriate fat sources and healthy verses unhealthy fat types   States protein recommendations and appropriate sources post-operatively Identifies the need for appropriate texture modifications, mastication, and bite sizes when consuming solids Identifies appropriate fat consumption and sources Identifies appropriate multivitamin and calcium sources post-operatively Describes the need for physical activity post-operatively and will follow MD recommendations States when to call healthcare provider regarding medication questions or post-operative complications   Handouts given during class include: Phase 3A: Soft, High Protein Diet Handout Phase 3 High Protein Meals Healthy Fats   Follow-Up Plan: Patient will follow-up at [facility] in 6 weeks for 2 month post-op nutrition visit for diet advancement per MD.

## 2021-06-26 ENCOUNTER — Telehealth: Payer: Self-pay | Admitting: Skilled Nursing Facility1

## 2021-06-26 NOTE — Telephone Encounter (Signed)
RD called pt to verify fluid intake once starting soft, solid proteins 2 week post-bariatric surgery.   Daily Fluid intake:  Daily Protein intake: Bowel Habits:   Concerns/issues:    LVM 

## 2021-06-28 LAB — HM DIABETES EYE EXAM

## 2021-07-04 DIAGNOSIS — H02535 Eyelid retraction left lower eyelid: Secondary | ICD-10-CM | POA: Diagnosis not present

## 2021-07-04 DIAGNOSIS — D492 Neoplasm of unspecified behavior of bone, soft tissue, and skin: Secondary | ICD-10-CM | POA: Diagnosis not present

## 2021-07-04 DIAGNOSIS — H02021 Mechanical entropion of right upper eyelid: Secondary | ICD-10-CM | POA: Diagnosis not present

## 2021-07-04 DIAGNOSIS — H02421 Myogenic ptosis of right eyelid: Secondary | ICD-10-CM | POA: Diagnosis not present

## 2021-07-04 DIAGNOSIS — H02024 Mechanical entropion of left upper eyelid: Secondary | ICD-10-CM | POA: Diagnosis not present

## 2021-07-04 DIAGNOSIS — H02532 Eyelid retraction right lower eyelid: Secondary | ICD-10-CM | POA: Diagnosis not present

## 2021-07-04 DIAGNOSIS — H02422 Myogenic ptosis of left eyelid: Secondary | ICD-10-CM | POA: Diagnosis not present

## 2021-07-06 ENCOUNTER — Encounter: Payer: Self-pay | Admitting: Gastroenterology

## 2021-07-12 DIAGNOSIS — D492 Neoplasm of unspecified behavior of bone, soft tissue, and skin: Secondary | ICD-10-CM | POA: Diagnosis not present

## 2021-07-12 DIAGNOSIS — L720 Epidermal cyst: Secondary | ICD-10-CM | POA: Diagnosis not present

## 2021-07-12 DIAGNOSIS — L821 Other seborrheic keratosis: Secondary | ICD-10-CM | POA: Diagnosis not present

## 2021-07-12 DIAGNOSIS — L918 Other hypertrophic disorders of the skin: Secondary | ICD-10-CM | POA: Diagnosis not present

## 2021-08-01 ENCOUNTER — Encounter: Payer: 59 | Attending: Surgery | Admitting: Skilled Nursing Facility1

## 2021-08-01 ENCOUNTER — Other Ambulatory Visit: Payer: Self-pay

## 2021-08-01 DIAGNOSIS — E119 Type 2 diabetes mellitus without complications: Secondary | ICD-10-CM | POA: Diagnosis not present

## 2021-08-01 NOTE — Progress Notes (Signed)
Bariatric Nutrition Follow-Up Visit Medical Nutrition Therapy   NUTRITION ASSESSMENT    Surgery date: 06/05/2021 Surgery type: sleeve Start weight at [facility]: 277.5 pounds Weight today: 246.1 pounds   Body Composition Scale 06/20/2021 08/01/2021  Current Body Weight 247.8 246.1  Total Body Fat % 28.8 28.5  Visceral Fat 19 19  Fat-Free Mass % 71.1 71.4   Total Body Water % 52.1 52.4  Muscle-Mass lbs 46.6 46.3  BMI 33.6 33.4  Body Fat Displacement           Torso  lbs 44.2 43.6         Left Leg  lbs 8.8 8.7         Right Leg  lbs 8.8 8.7         Left Arm  lbs 4.4 4.3         Right Arm   lbs 4.4 4.3   Clinical  Medical hx: GERD, anxiety, depression, diabetes, IBS-D, hypogonadism  Medications: gummy probiotics  Labs: A1C 6.2 Notable signs/symptoms: gynecomastia, IBS-D Any previous deficiencies? Vitamin D   Lifestyle & Dietary Hx  Pt states he is discouraged by gaining 5 pounds.   Pt states he has joined the [employer].  Pt states he went back to work december 12th and feels he stress eats at work but not at home.  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   Pt states he is off his diabetes medicines. Pt states his blood sugars have been about 95-104. Pt states he has much more energy now too. Pt states he was also able to tye his shoes without sweating and does not feel hot all the time and feels cold sometimes.   Estimated daily fluid intake: 60 oz Estimated daily protein intake: 80+ g Supplements: multi and calcium Current average weekly physical activity: ADLs: does plan to increase this    24-Hr Dietary Recall First Meal:  Snack: mini slim jims  Second Meal: cheese and meat rollup with mustard Snack: bag of [place] peopponi   Third Meal: slim jim Snack:  Beverages: gatorade zero with protein, diet green tea, water + flavoring   Post-Op Goals/ Signs/ Symptoms Using straws: no Drinking  while eating: no Chewing/swallowing difficulties: no Changes in vision: no Changes to mood/headaches: no Hair loss/changes to skin/nails: no Difficulty focusing/concentrating: no Sweating: no Limb weakness: no Dizziness/lightheadedness: no Palpitations: no  Carbonated/caffeinated beverages: yes but caused pain so stopped N/V/D/C/Gas: no Abdominal pain: no Dumping syndrome: no    NUTRITION DIAGNOSIS  Overweight/obesity ([place]-3.3) related to past poor dietary habits and physical inactivity as evidenced by completed bariatric surgery and following dietary guidelines for continued weight loss and healthy nutrition status.     NUTRITION INTERVENTION Nutrition counseling (C-1) and education (E-2) to facilitate bariatric surgery goals, including: Diet advancement to the next phase (phase 4) now including non starchy vegetables The importance of consuming adequate calories as well as certain nutrients daily due to the body's need for essential vitamins, minerals, and fats The importance of daily physical activity and to reach a goal of at least 150 minutes of moderate to vigorous physical activity weekly (or as directed by their physician) due to benefits such as increased musculature and improved lab values The importance of intuitive eating specifically learning hunger-satiety cues and understanding the importance of learning a new body: The importance of mindful eating to avoid grazing behaviors   Goals: -Continue to aim for  a minimum of 64 fluid ounces 7 days a week with at least 30 ounces being plain water  -Eat non-starchy vegetables 2 times a day 7 days a week  -Start out with soft cooked vegetables today and tomorrow; if tolerated begin to eat raw vegetables or cooked including salads  -Eat your 3 ounces of protein first then start in on your non-starchy vegetables; once you understand how much of your meal leads to satisfaction and not full while still eating 3 ounces of protein and  non-starchy vegetables you can eat them in any order   -Continue to aim for 30 minutes of activity at least 5 times a week  -Do NOT cook with/add to your food: alfredo sauce, cheese sauce, barbeque sauce, ketchup, fat back, butter, bacon grease, grease, Crisco, OR SUGAR   Handouts Provided Include  Phase 4  Learning Style & Readiness for Change Teaching method utilized: Visual & Auditory  Demonstrated degree of understanding via: Teach Back  Readiness Level: action Barriers to learning/adherence to lifestyle change: none stated  RD's Notes for Next Visit Assess adherence to pt chosen goals    MONITORING & EVALUATION Dietary intake, weekly physical activity, body weight  Next Steps Patient is to follow-up in 2-3 months

## 2021-08-02 ENCOUNTER — Other Ambulatory Visit (HOSPITAL_COMMUNITY): Payer: Self-pay

## 2021-08-24 DIAGNOSIS — F4310 Post-traumatic stress disorder, unspecified: Secondary | ICD-10-CM | POA: Diagnosis not present

## 2021-08-24 DIAGNOSIS — F4010 Social phobia, unspecified: Secondary | ICD-10-CM | POA: Diagnosis not present

## 2021-08-24 DIAGNOSIS — F411 Generalized anxiety disorder: Secondary | ICD-10-CM | POA: Diagnosis not present

## 2021-08-24 DIAGNOSIS — F410 Panic disorder [episodic paroxysmal anxiety] without agoraphobia: Secondary | ICD-10-CM | POA: Diagnosis not present

## 2021-08-24 DIAGNOSIS — F339 Major depressive disorder, recurrent, unspecified: Secondary | ICD-10-CM | POA: Diagnosis not present

## 2021-08-28 ENCOUNTER — Encounter: Payer: 59 | Admitting: Medical-Surgical

## 2021-08-28 NOTE — Progress Notes (Signed)
This encounter was created in error - please disregard.

## 2021-09-01 ENCOUNTER — Other Ambulatory Visit: Payer: Self-pay | Admitting: Family Medicine

## 2021-09-01 ENCOUNTER — Other Ambulatory Visit: Payer: Self-pay | Admitting: Gastroenterology

## 2021-09-01 DIAGNOSIS — K2970 Gastritis, unspecified, without bleeding: Secondary | ICD-10-CM

## 2021-09-01 DIAGNOSIS — R1310 Dysphagia, unspecified: Secondary | ICD-10-CM

## 2021-09-01 DIAGNOSIS — K2990 Gastroduodenitis, unspecified, without bleeding: Secondary | ICD-10-CM

## 2021-09-01 DIAGNOSIS — I10 Essential (primary) hypertension: Secondary | ICD-10-CM

## 2021-09-12 ENCOUNTER — Ambulatory Visit: Payer: 59 | Admitting: Gastroenterology

## 2021-09-14 ENCOUNTER — Ambulatory Visit: Payer: 59 | Admitting: Gastroenterology

## 2021-10-16 ENCOUNTER — Ambulatory Visit: Payer: 59 | Admitting: Family Medicine

## 2021-10-16 ENCOUNTER — Encounter: Payer: Self-pay | Admitting: Family Medicine

## 2021-10-16 ENCOUNTER — Other Ambulatory Visit: Payer: Self-pay

## 2021-10-16 VITALS — BP 133/81 | HR 71 | Ht 72.0 in | Wt 241.0 lb

## 2021-10-16 DIAGNOSIS — E781 Pure hyperglyceridemia: Secondary | ICD-10-CM | POA: Diagnosis not present

## 2021-10-16 DIAGNOSIS — I10 Essential (primary) hypertension: Secondary | ICD-10-CM

## 2021-10-16 DIAGNOSIS — E119 Type 2 diabetes mellitus without complications: Secondary | ICD-10-CM

## 2021-10-16 LAB — POCT GLYCOSYLATED HEMOGLOBIN (HGB A1C): HbA1c, POC (controlled diabetic range): 5.8 % (ref 0.0–7.0)

## 2021-10-16 NOTE — Patient Instructions (Addendum)
Great to see you today! ?Work on incorporating exercise during the week.  ?We'll be in touch with lab results.  ?See me again in about 6 months or sooner if needed.  ?

## 2021-10-16 NOTE — Progress Notes (Signed)
?Tyler Gross - 46 y.o. male MRN [id number]  Date of birth: 22-Apr-1976 ? ?Subjective ?Chief Complaint  ?Patient presents with  ? Hypertension  ? Diabetes  ? ? ?HPI ?Tyler Gross is a 46 year old male here today for follow-up visit.  He continues to struggle with weight management.  He did have weight loss surgery last year and initially did well with this.  He reports that he does not feel satiated easily now.  He has joined [employer] recently and has attended a few meetings.  He is concerned his A1c may have increased since last visit.  Weight is down some since last visit. ? ?Blood pressure remains well controlled current medications.  Denies side effects with current medications.  He has not had chest pain, shortness of breath, palpitations, headaches or vision changes. ? ?Continues to tolerate rosuvastatin well at current strength. ? ?ROS:  A comprehensive ROS was completed and negative except as noted per HPI ? ?Allergies  ?Allergen Reactions  ? Atorvastatin Other (See Comments)  ?  Muscle fatigue  ? Bee Venom Swelling  ? Sulfamethoxazole-Trimethoprim Hives  ?  Bactrim   ? ? ?Past Medical History:  ?Diagnosis Date  ? Acne   ? on back taking doxycycline for  ? Alcoholism ([place])   ? sober since 04/2018  ? Anxiety   ? Depression   ? DM type 2 (diabetes mellitus, type 2) ([place])   ? ED (erectile dysfunction)   ? GAD (generalized anxiety disorder)   ? GERD (gastroesophageal reflux disease)   ? Gynecomastia, male   ? followed by dr gherhe  ? High serum estradiol   ? resolved  ? History of anal fissures   ? History of kidney stones   ? History of small bowel obstruction 2004  ? Hypertension   ? IBS (irritable bowel syndrome)   ? Mixed hyperlipidemia   ? OSA on CPAP   ? per last study 07-31-2013  severe osa  ? Primary hypogonadism in male   ? endocrinologist-  dr Shellia Cleverly  ? ? ?Past Surgical History:  ?Procedure Laterality Date  ? CARDIOVASCULAR STRESS TEST  10/05/2009  ? normal nuclear study w/ no ischemia/  normal  LV function and wall motion , ef 71%  ? COLONOSCOPY  last one  ? 2004, april 2021  ? EVALUATION UNDER ANESTHESIA WITH ANAL FISTULECTOMY N/A 02/13/2018  ? Procedure: ANAL EXAM UNDER ANESTHESIA WITH BIOPSY;  Surgeon: [name], MD;  Location: [place];  Service: General;  Laterality: N/A;  ? ORCHIECTOMY Left 1991  ? w/ placement prosthesis (for torsion)  ? TESTICULAR EXPLORATION Left 01/29/2020  ? Procedure: EXCHANGE OF LEFT TESTICULAR PROSTHESIS;  Surgeon: Cleon Gustin, MD;  Location: [facility];  Service: Urology;  Laterality: Left;  ? UPPER GI ENDOSCOPY N/A 06/05/2021  ? Procedure: UPPER GI ENDOSCOPY;  Surgeon: Johnathan Hausen, MD;  Location: [facility];  Service: General;  Laterality: N/A;  ? URETEROLITHOTOMY  1999  ? ? ?Social History  ? ?Socioeconomic History  ? Marital status: Single  ?  Spouse name: Not on file  ? Number of children: Not on file  ? Years of education: Not on file  ? Highest education level: Not on file  ?Occupational History  ? Occupation: Therapist, sports   ?Tobacco Use  ? Smoking status: Former  ?  Packs/day: 1.00  ?  Years: 10.00  ?  Pack years: 10.00  ?  Types: Cigarettes  ?  Quit date: 02/07/2009  ?  Years since quitting: 12.6  ? Smokeless tobacco: Never  ?Vaping Use  ? Vaping Use: Never used  ?Substance and Sexual Activity  ? Alcohol use: Not Currently  ? Drug use: No  ? Sexual activity: Yes  ?  Partners: Male  ?Other Topics Concern  ? Not on file  ?Social History Narrative  ? Lives with partner  ? Regular exercise: no  ? Caffeine use: 1 large cup of coffee daily; 2 to 3 sodas in the evening  ? ?Social Determinants of Health  ? ?Financial Resource Strain: Not on file  ?Food Insecurity: Not on file  ?Transportation Needs: Not on file  ?Physical Activity: Not on file  ?Stress: Not on file  ?Social Connections: Not on file  ? ? ?Family History  ?Problem Relation Age of Onset  ? Cancer Mother   ?     breast cancer: stage I  ? Hypertension Mother   ? Irritable bowel  syndrome Mother   ? Heart disease Maternal Grandfather   ? Diabetes Paternal Grandmother   ? Colon cancer Maternal Aunt   ?     great aunt  ? Esophageal cancer Neg Hx   ? Rectal cancer Neg Hx   ? Stomach cancer Neg Hx   ? ? ?Health Maintenance  ?Topic Date Due  ? OPHTHALMOLOGY EXAM  Never done  ? Hepatitis C Screening  Never done  ? COVID-19 Vaccine (4 - Booster) 11/01/2021 (Originally 07/01/2020)  ? FOOT EXAM  11/25/2021  ? HEMOGLOBIN A1C  04/18/2022  ? TETANUS/TDAP  01/27/2024  ? COLONOSCOPY (Pts 45-52yr Insurance coverage Tyler Gross need to be confirmed)  10/07/2029  ? INFLUENZA VACCINE  Completed  ? HIV Screening  Completed  ? HPV VACCINES  Aged Out  ? ? ? ?----------------------------------------------------------------------------------------------------------------------------------------------------------------------------------------------------------------- ?Physical Exam ?BP 133/81 (BP Location: Right Arm, Patient Position: Sitting, Cuff Size: Normal)   Pulse 71   Ht 6' (1.829 m)   Wt 241 lb (109.3 kg)   SpO2 98%   BMI 32.69 kg/m?  ? ?Physical Exam ?Constitutional:   ?   Appearance: Normal appearance.  ?Eyes:  ?   General: No scleral icterus. ?Cardiovascular:  ?   Rate and Rhythm: Normal rate and regular rhythm.  ?Pulmonary:  ?   Effort: Pulmonary effort is normal.  ?   Breath sounds: Normal breath sounds.  ?Musculoskeletal:  ?   Cervical back: Neck supple.  ?Neurological:  ?   General: No focal deficit present.  ?   Mental Status: He is alert.  ?Psychiatric:     ?   Mood and Affect: Mood normal.     ?   Behavior: Behavior normal.  ? ? ?------------------------------------------------------------------------------------------------------------------------------------------------------------------------------------------------------------------- ?Assessment and Plan ? ?Essential hypertension ?Blood pressure remains well controlled at this time.  Recommend continuation of current medications. ? ?Type 2  diabetes mellitus without complication, without long-term current use of insulin ([place] ?A1c of 5.8%.  Remains well controlled at this time.  Encouraged to continue to work on dietary change. ? ? ?No orders of the defined types were placed in this encounter. ? ? ?Return in about 6 months (around 04/18/2022) for HTN/T2DM. ? ? ? ?This visit occurred during the SARS-CoV-2 public health emergency.  Safety protocols were in place, including screening questions prior to the visit, additional usage of staff PPE, and extensive cleaning of exam room while observing appropriate contact time as indicated for disinfecting solutions.  ? ?

## 2021-10-16 NOTE — Assessment & Plan Note (Signed)
A1c of 5.8%.  Remains well controlled at this time.  Encouraged to continue to work on dietary change. ?

## 2021-10-16 NOTE — Assessment & Plan Note (Signed)
Blood pressure remains well controlled at this time.  Recommend continuation of current medications. ?

## 2021-10-17 ENCOUNTER — Encounter: Payer: 59 | Attending: Surgery | Admitting: Skilled Nursing Facility1

## 2021-10-17 DIAGNOSIS — E119 Type 2 diabetes mellitus without complications: Secondary | ICD-10-CM | POA: Insufficient documentation

## 2021-10-17 LAB — LIPID PANEL W/REFLEX DIRECT LDL
Cholesterol: 176 mg/dL (ref <200)
HDL: 47 mg/dL (ref >=40)
LDL Cholesterol (Calc): 102 mg/dL (calc) — ABNORMAL HIGH
Non-HDL Cholesterol (Calc): 129 mg/dL (calc) (ref <130)
Total CHOL/HDL Ratio: 3.7 (calc) (ref <5.0)
Triglycerides: 170 mg/dL — ABNORMAL HIGH (ref <150)

## 2021-10-17 LAB — COMPLETE METABOLIC PANEL WITH GFR
AG Ratio: 1.9 (calc) (ref 1.0–2.5)
ALT: 18 U/L (ref 9–46)
AST: 20 U/L (ref 10–40)
Albumin: 4.8 g/dL (ref 3.6–5.1)
Alkaline phosphatase (APISO): 72 U/L (ref 36–130)
BUN: 15 mg/dL (ref 7–25)
CO2: 28 mmol/L (ref 20–32)
Calcium: 9.8 mg/dL (ref 8.6–10.3)
Chloride: 104 mmol/L (ref 98–110)
Creat: 1.17 mg/dL (ref 0.60–1.29)
Globulin: 2.5 g/dL (calc) (ref 1.9–3.7)
Glucose, Bld: 105 mg/dL — ABNORMAL HIGH (ref 65–99)
Potassium: 4.5 mmol/L (ref 3.5–5.3)
Sodium: 142 mmol/L (ref 135–146)
Total Bilirubin: 0.6 mg/dL (ref 0.2–1.2)
Total Protein: 7.3 g/dL (ref 6.1–8.1)
eGFR: 78 mL/min/{1.73_m2} (ref >=60)

## 2021-10-18 NOTE — Progress Notes (Signed)
Follow-up visit:  Post-Operative sleeve Surgery ? ?Medical Nutrition Therapy:  Appt start time: 6:00pm end time:  7:00pm ? ?Primary concerns today: Post-operative Bariatric Surgery Nutrition Management 6 Month Post-Op Class ? ?Surgery date: 06/05/2021 ?Surgery type: sleeve ?Start weight at [facility]: 277.5 pounds ?Weight today: 240.3 pounds ?  ?Body Composition Scale 06/20/2021 08/01/2021 10/18/2021  ?Current Body Weight 247.8 246.1 240.3  ?Total Body Fat % 28.8 28.5 27.7  ?Visceral Fat '19 19 18  '$ ?Fat-Free Mass % 71.1 71.4 72.2  ? Total Body Water % 52.1 52.4 53.2  ?Muscle-Mass lbs 46.6 46.3 45.2  ?BMI 33.6 33.4 32.6  ?Body Fat Displacement     ?       Torso  lbs 44.2 43.6 41.2  ?       Left Leg  lbs 8.8 8.7 8.2  ?       Right Leg  lbs 8.8 8.7 8.2  ?       Left Arm  lbs 4.4 4.3 4.1  ?       Right Arm   lbs 4.4 4.3 4.1  ? ?Clinical  ?Medical hx: GERD, anxiety, depression, diabetes, IBS-D, hypogonadism  ?Medications: gummy probiotics  ?Labs: A1C 6.2 ?Notable signs/symptoms: gynecomastia, IBS-D ?Any previous deficiencies? Vitamin D ? ? ? ?Information Reviewed/ Discussed During Appointment: ?-Review of composition scale numbers ?-Fluid requirements (64-100 ounces) ?-Protein requirements (60-80g) ?-Strategies for tolerating diet ?-Advancement of diet to include Starchy vegetables ?-Barriers to inclusion of new foods ?-Inclusion of appropriate multivitamin and calcium supplements  ?-Exercise recommendations  ? ?Fluid intake: adequate  ? ?Medications: See List ?Supplementation: appropriate  ? ?Last patient reported A1c: 5.8 (down from 8.5) ? ?Using straws: no ?Drinking while eating: no ?Having you been chewing well: yes ?Chewing/swallowing difficulties: no ?Changes in vision: no ?Changes to mood/headaches: no ?Hair loss/[place] to skin/Changes to nails: no ?Any difficulty focusing or concentrating: no ?Sweating: no ?Dizziness/Lightheaded: no ?Palpitations: no  ?Carbonated beverages: no ?N/V/D/C/GAS: no ?Abdominal Pain:  no ?Dumping syndrome: no ? ?Recent physical activity:  ADL's ? ?Progress Towards Goal(s):  In Progress ?Teaching method utilized: Visual & Auditory  ?Demonstrated degree of understanding via: Teach Back  ?Readiness Level: Action ?Barriers to learning/adherence to lifestyle change: none identified ? ?Handouts given during visit include: ?Phase V diet Progression  ?Goals Sheet ?The Benefits of Exercise are endless..... ?Support Group Topics ? ? ?Teaching Method Utilized:  ?Visual ?Auditory ?Hands on ? ?Demonstrated degree of understanding via:  Teach Back  ? ?Monitoring/Evaluation:  Dietary intake, exercise, and body weight. Follow up in 3 months for 9 month post-op visit. ? ?

## 2021-11-10 ENCOUNTER — Other Ambulatory Visit (HOSPITAL_COMMUNITY): Payer: Self-pay

## 2021-11-16 DIAGNOSIS — F411 Generalized anxiety disorder: Secondary | ICD-10-CM | POA: Diagnosis not present

## 2021-11-16 DIAGNOSIS — F4010 Social phobia, unspecified: Secondary | ICD-10-CM | POA: Diagnosis not present

## 2021-11-16 DIAGNOSIS — F339 Major depressive disorder, recurrent, unspecified: Secondary | ICD-10-CM | POA: Diagnosis not present

## 2021-11-16 DIAGNOSIS — F410 Panic disorder [episodic paroxysmal anxiety] without agoraphobia: Secondary | ICD-10-CM | POA: Diagnosis not present

## 2021-11-16 DIAGNOSIS — F4310 Post-traumatic stress disorder, unspecified: Secondary | ICD-10-CM | POA: Diagnosis not present

## 2021-11-25 ENCOUNTER — Other Ambulatory Visit: Payer: Self-pay | Admitting: Urology

## 2021-11-25 ENCOUNTER — Other Ambulatory Visit: Payer: Self-pay | Admitting: Family Medicine

## 2021-11-25 ENCOUNTER — Other Ambulatory Visit: Payer: Self-pay | Admitting: Gastroenterology

## 2021-11-25 DIAGNOSIS — K2970 Gastritis, unspecified, without bleeding: Secondary | ICD-10-CM

## 2021-11-25 DIAGNOSIS — R1310 Dysphagia, unspecified: Secondary | ICD-10-CM

## 2021-12-03 ENCOUNTER — Other Ambulatory Visit: Payer: Self-pay | Admitting: Family Medicine

## 2021-12-05 ENCOUNTER — Telehealth: Payer: Self-pay | Admitting: Family Medicine

## 2021-12-05 ENCOUNTER — Other Ambulatory Visit: Payer: Self-pay

## 2021-12-05 DIAGNOSIS — E291 Testicular hypofunction: Secondary | ICD-10-CM | POA: Diagnosis not present

## 2021-12-05 MED ORDER — ROSUVASTATIN CALCIUM 10 MG PO TABS: 10.0000 mg | ORAL_TABLET | Freq: Every day | ORAL | 0 refills | Status: DC

## 2021-12-05 NOTE — Telephone Encounter (Signed)
Patient called and was very rude about his prescription refill for rosuvastatin. He said we are inconveniencing him and very rude.  ?

## 2021-12-13 DIAGNOSIS — E291 Testicular hypofunction: Secondary | ICD-10-CM | POA: Diagnosis not present

## 2021-12-13 DIAGNOSIS — N522 Drug-induced erectile dysfunction: Secondary | ICD-10-CM | POA: Diagnosis not present

## 2021-12-16 ENCOUNTER — Encounter: Payer: Self-pay | Admitting: Family Medicine

## 2021-12-19 ENCOUNTER — Encounter: Payer: Self-pay | Admitting: Family Medicine

## 2021-12-19 DIAGNOSIS — Z111 Encounter for screening for respiratory tuberculosis: Secondary | ICD-10-CM

## 2021-12-20 ENCOUNTER — Other Ambulatory Visit: Payer: Self-pay

## 2021-12-20 DIAGNOSIS — Z111 Encounter for screening for respiratory tuberculosis: Secondary | ICD-10-CM

## 2021-12-20 NOTE — Progress Notes (Signed)
Lab order placed for TB screening. Pt notified via [place]. ?

## 2021-12-28 DIAGNOSIS — Z111 Encounter for screening for respiratory tuberculosis: Secondary | ICD-10-CM | POA: Diagnosis not present

## 2022-01-01 LAB — QUANTIFERON-TB GOLD PLUS
Mitogen-NIL: >10 IU/mL
NIL: 0.04 IU/mL
QuantiFERON-TB Gold Plus: NEGATIVE
TB1-NIL: 0.01 IU/mL
TB2-NIL: 0.01 IU/mL

## 2022-01-12 DIAGNOSIS — K130 Diseases of lips: Secondary | ICD-10-CM | POA: Diagnosis not present

## 2022-01-15 ENCOUNTER — Other Ambulatory Visit: Payer: Self-pay | Admitting: Urology

## 2022-01-15 DIAGNOSIS — E291 Testicular hypofunction: Secondary | ICD-10-CM

## 2022-01-31 DIAGNOSIS — F339 Major depressive disorder, recurrent, unspecified: Secondary | ICD-10-CM | POA: Diagnosis not present

## 2022-01-31 DIAGNOSIS — F411 Generalized anxiety disorder: Secondary | ICD-10-CM | POA: Diagnosis not present

## 2022-01-31 DIAGNOSIS — F410 Panic disorder [episodic paroxysmal anxiety] without agoraphobia: Secondary | ICD-10-CM | POA: Diagnosis not present

## 2022-01-31 DIAGNOSIS — F4310 Post-traumatic stress disorder, unspecified: Secondary | ICD-10-CM | POA: Diagnosis not present

## 2022-01-31 DIAGNOSIS — F4010 Social phobia, unspecified: Secondary | ICD-10-CM | POA: Diagnosis not present

## 2022-02-26 ENCOUNTER — Other Ambulatory Visit: Payer: Self-pay | Admitting: Urology

## 2022-02-26 ENCOUNTER — Other Ambulatory Visit: Payer: Self-pay | Admitting: Gastroenterology

## 2022-02-26 ENCOUNTER — Other Ambulatory Visit: Payer: Self-pay | Admitting: Family Medicine

## 2022-02-26 DIAGNOSIS — K2970 Gastritis, unspecified, without bleeding: Secondary | ICD-10-CM

## 2022-02-26 DIAGNOSIS — R1310 Dysphagia, unspecified: Secondary | ICD-10-CM

## 2022-04-17 ENCOUNTER — Encounter: Payer: Self-pay | Admitting: Family Medicine

## 2022-04-18 ENCOUNTER — Ambulatory Visit: Payer: 59 | Admitting: Family Medicine

## 2022-04-18 ENCOUNTER — Encounter: Payer: Self-pay | Admitting: Family Medicine

## 2022-04-18 VITALS — BP 131/79 | HR 79 | Ht 72.0 in | Wt 249.1 lb

## 2022-04-18 DIAGNOSIS — F32A Depression, unspecified: Secondary | ICD-10-CM | POA: Diagnosis not present

## 2022-04-18 DIAGNOSIS — E119 Type 2 diabetes mellitus without complications: Secondary | ICD-10-CM | POA: Diagnosis not present

## 2022-04-18 DIAGNOSIS — E291 Testicular hypofunction: Secondary | ICD-10-CM

## 2022-04-18 DIAGNOSIS — I10 Essential (primary) hypertension: Secondary | ICD-10-CM | POA: Diagnosis not present

## 2022-04-18 LAB — POCT GLYCOSYLATED HEMOGLOBIN (HGB A1C): HbA1c POC (<> result, manual entry): 5.4 % (ref 4.0–5.6)

## 2022-04-18 LAB — POCT UA - MICROALBUMIN
Creatinine, POC: 300 mg/dL
Microalbumin Ur, POC: 150 mg/L

## 2022-04-18 NOTE — Assessment & Plan Note (Signed)
Management per urology.  Stable at this time.

## 2022-04-18 NOTE — Assessment & Plan Note (Signed)
Lab Results  Component Value Date   HGBA1C 5.4 04/18/2022  Blood sugar remains well controlled.  He has gained some weight.  We can consider addition of mounjaro for continued glucose and weight control if this continues to trend up.

## 2022-04-18 NOTE — Assessment & Plan Note (Signed)
Stable at this time.  [facility].

## 2022-04-18 NOTE — Assessment & Plan Note (Signed)
BP remains well controlled with current medications.  Will plan to continue.

## 2022-04-18 NOTE — Patient Instructions (Signed)
Great to see you! Continue to work on diet.  See me again in 6 months.

## 2022-04-18 NOTE — Progress Notes (Signed)
Tyler Gross - 46 y.o. male MRN [id number]  Date of birth: [birth year]  Subjective Chief Complaint  Patient presents with   Diabetes    HPI Tyler Gross is a 46 y.o. male here today for follow up visit.   Reports that he is feeling pretty well.  Remains in DNP program through [place] [place].    Diabetes has been managed pretty well with dietary change and reduction of EtOH intake.  Has had some trouble with sticking to diet over the past few months. Had weight loss surgery last year and weight has increased some since last visit.      BP has remained well controlled with lisinopril/hctz, amlodipine and atenolol.  Denies side effects.  Has not had chest pain, shortness of breath ,palpitations, headacher or vision changes.    He is seeing psychiatry for management of medications for mood.  Stable at this time.   He is seeing urology for management of low testosterone.    ROS:  A comprehensive ROS was completed and negative except as noted per HPI  Allergies  Allergen Reactions   Atorvastatin Other (See Comments)    Muscle fatigue   Bee Venom Swelling   Sulfamethoxazole-Trimethoprim Hives    Bactrim     Past Medical History:  Diagnosis Date   Acne    on back taking doxycycline for   Alcoholism [facility])    sober since 04/2018   Anxiety    Depression    DM type 2 (diabetes mellitus, type 2) ([place])    ED (erectile dysfunction)    GAD (generalized anxiety disorder)    GERD (gastroesophageal reflux disease)    Gynecomastia, male    followed by dr Shellia Cleverly   High serum estradiol    resolved   History of anal fissures    History of kidney stones    History of small bowel obstruction 2004   Hypertension    IBS (irritable bowel syndrome)    Mixed hyperlipidemia    OSA on CPAP    per last study 07-31-2013  severe osa   Primary hypogonadism in male    endocrinologist-  dr Shellia Cleverly    Past Surgical History:  Procedure Laterality Date   CARDIOVASCULAR STRESS TEST  10/05/2009    normal nuclear study w/ no ischemia/  normal LV function and wall motion , ef 71%   COLONOSCOPY  last one   2004, april 2021   EVALUATION UNDER ANESTHESIA WITH ANAL FISTULECTOMY N/A 02/13/2018   Procedure: ANAL EXAM UNDER ANESTHESIA WITH BIOPSY;  Surgeon: [name], MD;  Location: [facility];  Service: General;  Laterality: N/A;   ORCHIECTOMY Left 1991   w/ placement prosthesis (for torsion)   TESTICULAR EXPLORATION Left 01/29/2020   Procedure: EXCHANGE OF LEFT TESTICULAR PROSTHESIS;  Surgeon: Cleon Gustin, MD;  Location: [facility];  Service: Urology;  Laterality: Left;   UPPER GI ENDOSCOPY N/A 06/05/2021   Procedure: UPPER GI ENDOSCOPY;  Surgeon: Johnathan Hausen, MD;  Location: [facility];  Service: General;  Laterality: N/A;   URETEROLITHOTOMY  1999    Social History   Socioeconomic History   Marital status: Single    Spouse name: Not on file   Number of children: Not on file   Years of education: Not on file   Highest education level: Not on file  Occupational History   Occupation: RN   Tobacco Use   Smoking status: Former    Packs/day: 1.00    Years: 10.00  Total pack years: 10.00    Types: Cigarettes    Quit date: 02/07/2009    Years since quitting: 13.2   Smokeless tobacco: Never  Vaping Use   Vaping Use: Never used  Substance and Sexual Activity   Alcohol use: Not Currently   Drug use: No   Sexual activity: Yes    Partners: Male  Other Topics Concern   Not on file  Social History Narrative   Lives with partner   Regular exercise: no   Caffeine use: 1 large cup of coffee daily; 2 to 3 sodas in the evening   Social Determinants of Health   Financial Resource Strain: Not on file  Food Insecurity: Not on file  Transportation Needs: Not on file  Physical Activity: Not on file  Stress: Not on file  Social Connections: Not on file    Family History  Problem Relation Age of Onset   Cancer Mother        breast cancer:  stage I   Hypertension Mother    Irritable bowel syndrome Mother    Heart disease Maternal Grandfather    Diabetes Paternal Grandmother    Colon cancer Maternal Aunt        great aunt   Esophageal cancer Neg Hx    Rectal cancer Neg Hx    Stomach cancer Neg Hx     Health Maintenance  Topic Date Due   OPHTHALMOLOGY EXAM  09/06/2022 (Originally 11[birth year]16/1984)   COVID-19 Vaccine (4 - Pfizer risk series) 09/06/2022 (Originally 07/01/2020)   Hepatitis C Screening  04/19/2023 (Originally 11/16/1992)   Diabetic kidney evaluation - GFR measurement  10/17/2022   HEMOGLOBIN A1C  10/17/2022   Diabetic kidney evaluation - Urine ACR  04/19/2023   FOOT EXAM  04/19/2023   TETANUS/TDAP  01/27/2024   COLONOSCOPY (Pts 45-33yr Insurance coverage will need to be confirmed)  10/07/2029   INFLUENZA VACCINE  Completed   HIV Screening  Completed   HPV VACCINES  Aged Out     ----------------------------------------------------------------------------------------------------------------------------------------------------------------------------------------------------------------- Physical Exam BP 131/79 (BP Location: Left Arm, Patient Position: Sitting, Cuff Size: Large)   Pulse 79   Ht 6' (1.829 m)   Wt 249 lb 1.9 oz (113 kg)   SpO2 96%   BMI 33.79 kg/m   Physical Exam Constitutional:      Appearance: Normal appearance.  Eyes:     General: No scleral icterus. Cardiovascular:     Rate and Rhythm: Normal rate and regular rhythm.  Pulmonary:     Effort: Pulmonary effort is normal.     Breath sounds: Normal breath sounds.  Musculoskeletal:     Cervical back: Neck supple.  Neurological:     Mental Status: He is alert.  Psychiatric:        Mood and Affect: Mood normal.        Behavior: Behavior normal.      ------------------------------------------------------------------------------------------------------------------------------------------------------------------------------------------------------------------- Assessment and Plan  Essential hypertension BP remains well controlled with current medications.  Will plan to continue.   Type 2 diabetes mellitus without complication, without long-term current use of insulin (HCC) Lab Results  Component Value Date   HGBA1C 5.4 04/18/2022  Blood sugar remains well controlled.  He has gained some weight.  We can consider addition of mounjaro for continued glucose and weight control if this continues to trend up.    Depressive disorder Stable at this time.  Management per mood treatment center.   Hypogonadism male Management per urology.  Stable at this time.  No orders of the defined types were placed in this encounter.   Return in about 6 months (around 10/17/2022) for HTN/T2DM/Fasting labs.    This visit occurred during the SARS-CoV-2 public health emergency.  Safety protocols were in place, including screening questions prior to the visit, additional usage of staff PPE, and extensive cleaning of exam room while observing appropriate contact time as indicated for disinfecting solutions.

## 2022-04-23 IMAGING — CT CT HEART MORP W/ CTA COR W/ SCORE W/ CA W/CM &/OR W/O CM
4 of 7 series · 8 of 20 positions shown, 9 images · IV contrast (APPLIED)
Comparison: None.
COMPARISON: None.

Addendum:
EXAM:
OVER-READ INTERPRETATION  CT CHEST

The following report is an over-read performed by radiologist Dr.
Blanche Corley [REDACTED] on 12/28/2020. This
over-read does not include interpretation of cardiac or coronary
anatomy or pathology. The coronary calcium score/coronary CTA
interpretation by the cardiologist is attached.
CLINICAL DATA: Chest pain
Cardiac CTA
MEDICATIONS:
Sub lingual nitro. 4 mg and lopressor 100mg
TECHNIQUE: The patient was scanned on a Siemens Force 192 scanner. Gantry
rotation speed was 250 msecs. Collimation was. 6 mm . A 120 kV
prospective scan was triggered in the ascending thoracic aorta at
140 HU's with full mA between 30-70% of the R-R interval . Average
HR during the scan was 74 bpm. The 3D data set was interpreted on a
dedicated work station using MPR, MIP and VRT modes. A total of 80
cc of contrast was used.

[Series 7: best diast 72 % · axial · 0.50mm/px · z∈[+1116,+1163]mm · 2 of 353 slices shown]
[im 118/353  vessel]
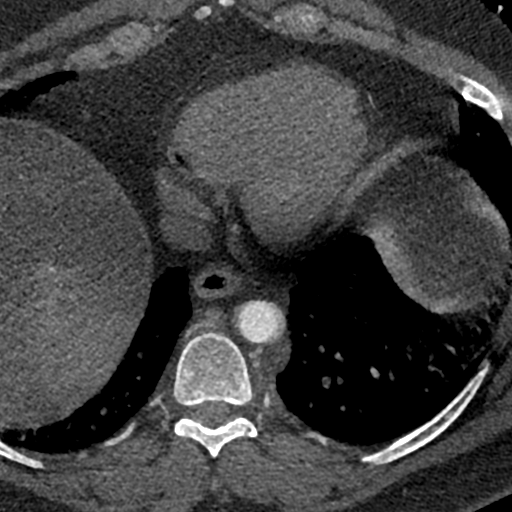
[im 235/353  vessel]
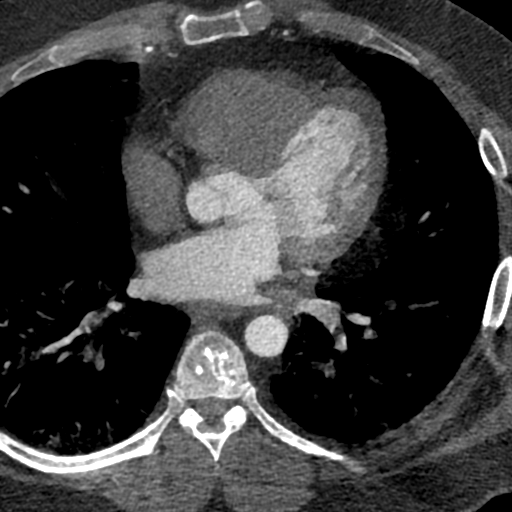

[Series 8: best syst · axial · 0.50mm/px · z∈[+1116,+1163]mm · 2 of 353 slices shown, 3 images]
[im 118/353  vessel]
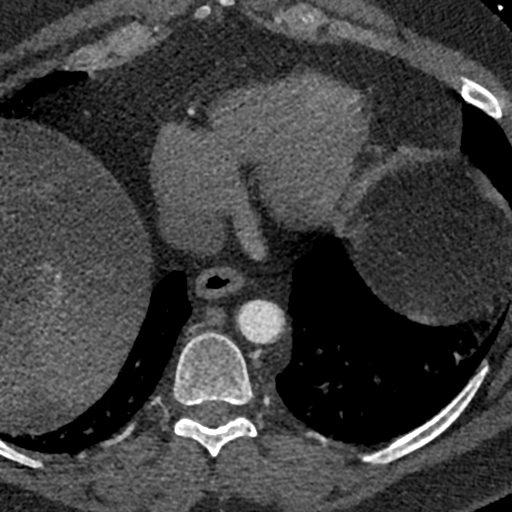
[im 118/353  lung]
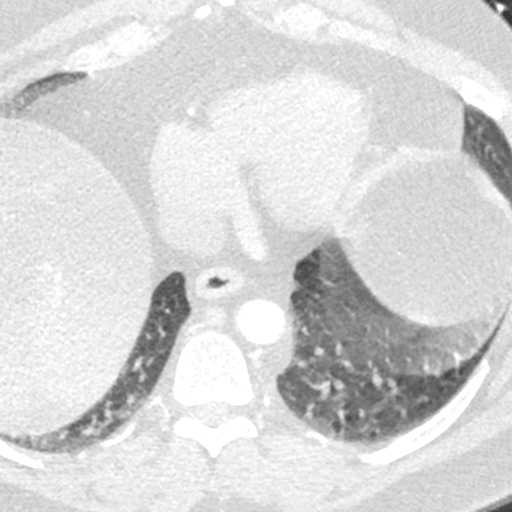
[im 235/353  vessel]
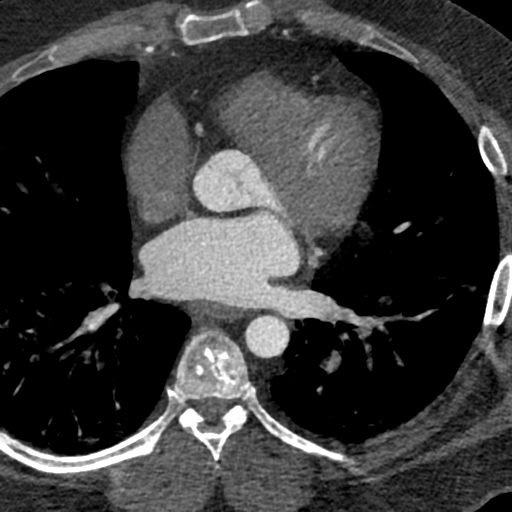

[Series 10: ts diast sharp 72 % · axial · 0.50mm/px · z∈[+1116,+1163]mm · 2 of 353 slices shown]
[im 118/353  lung]
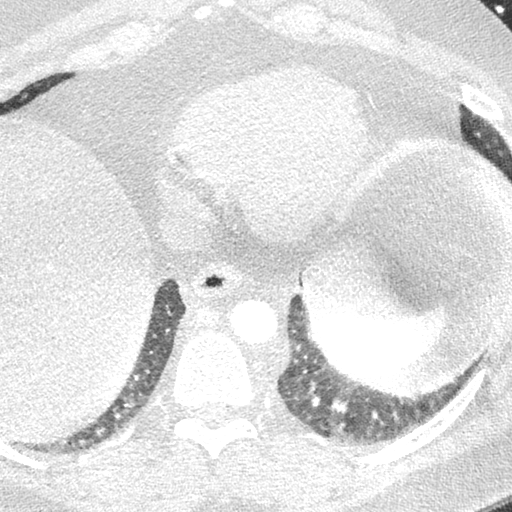
[im 235/353  lung]
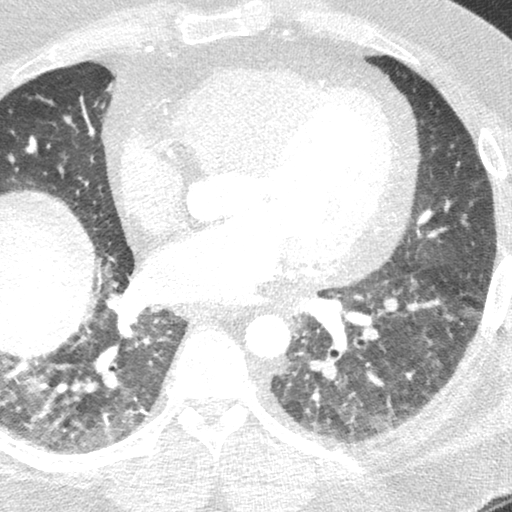

[Series 11: ts syst sharp 40 % · axial · 0.50mm/px · z∈[+1116,+1163]mm · 2 of 353 slices shown]
[im 118/353  lung]
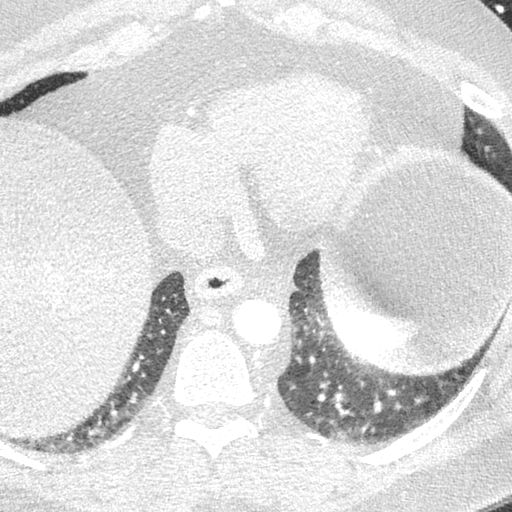
[im 235/353  lung]
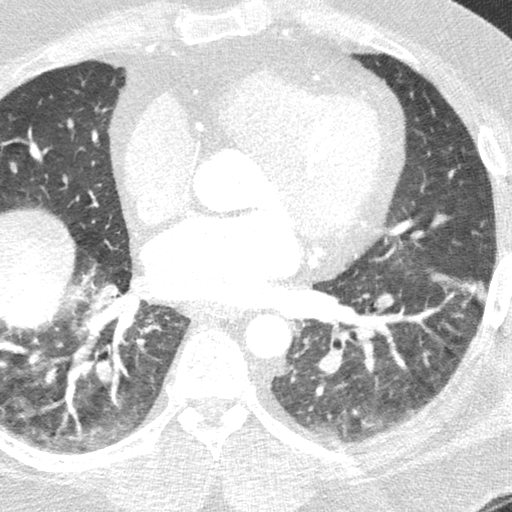

[8 of 20 positions shown; findings below may reference images not displayed]

FINDINGS: Vascular: Heart is at the upper limits of normal in size to mildly
enlarged. No pericardial effusion.

Mediastinum/Nodes: None.

Lungs/Pleura: Minimal dependent atelectasis.

Upper Abdomen: Liver is rather decreased in attenuation diffusely.
Otherwise unremarkable.

Musculoskeletal: None.
IMPRESSION: Hepatic steatosis.
FINDINGS: Non-cardiac: See separate report from [REDACTED]. No
significant findings on limited lung and soft tissue windows.

Calcium score: No calcium noted

Coronary Arteries: Right dominant with no anomalies

LM: Normal

LAD: Normal

D1: Normal

D2: Normal

D3: Normal

Circumflex: Normal

OM1: Normal

OM2: Normal

RCA: Normal

PDA: Normal

PLA: Normal
IMPRESSION: 1. Calcium score 0

2.  Normal aortic root 3.5 cm

3. Poor quality but diagnostic study due to patient motion and mis
registration Normal right dominant coronary arteries CAD RADS 0

Maria Magdalena Heo

*** End of Addendum ***
EXAM:
OVER-READ INTERPRETATION  CT CHEST

The following report is an over-read performed by radiologist Dr.
Blanche Corley [REDACTED] on 12/28/2020. This
over-read does not include interpretation of cardiac or coronary
anatomy or pathology. The coronary calcium score/coronary CTA
interpretation by the cardiologist is attached.
FINDINGS: Vascular: Heart is at the upper limits of normal in size to mildly
enlarged. No pericardial effusion.

Mediastinum/Nodes: None.

Lungs/Pleura: Minimal dependent atelectasis.

Upper Abdomen: Liver is rather decreased in attenuation diffusely.
Otherwise unremarkable.

Musculoskeletal: None.
IMPRESSION: Hepatic steatosis.

## 2022-04-25 DIAGNOSIS — F4010 Social phobia, unspecified: Secondary | ICD-10-CM | POA: Diagnosis not present

## 2022-04-25 DIAGNOSIS — F410 Panic disorder [episodic paroxysmal anxiety] without agoraphobia: Secondary | ICD-10-CM | POA: Diagnosis not present

## 2022-04-25 DIAGNOSIS — F411 Generalized anxiety disorder: Secondary | ICD-10-CM | POA: Diagnosis not present

## 2022-04-25 DIAGNOSIS — F339 Major depressive disorder, recurrent, unspecified: Secondary | ICD-10-CM | POA: Diagnosis not present

## 2022-04-25 DIAGNOSIS — F4310 Post-traumatic stress disorder, unspecified: Secondary | ICD-10-CM | POA: Diagnosis not present

## 2022-04-27 ENCOUNTER — Other Ambulatory Visit: Payer: Self-pay | Admitting: Gastroenterology

## 2022-04-27 ENCOUNTER — Other Ambulatory Visit: Payer: Self-pay | Admitting: Family Medicine

## 2022-04-27 DIAGNOSIS — R1310 Dysphagia, unspecified: Secondary | ICD-10-CM

## 2022-04-27 DIAGNOSIS — K2970 Gastritis, unspecified, without bleeding: Secondary | ICD-10-CM

## 2022-05-02 ENCOUNTER — Other Ambulatory Visit (HOSPITAL_BASED_OUTPATIENT_CLINIC_OR_DEPARTMENT_OTHER): Payer: Self-pay

## 2022-05-02 MED ORDER — HYDROXYZINE PAMOATE 25 MG PO CAPS: 25.0000 mg | ORAL_CAPSULE | Freq: Four times a day (QID) | ORAL | 2 refills | Status: DC | PRN

## 2022-05-02 MED ORDER — NYSTATIN 100000 UNIT/GM EX OINT
TOPICAL_OINTMENT | CUTANEOUS | 2 refills | Status: DC
  Filled 2022-09-27: qty 30, 30d supply, fill #0
  Filled 2022-10-31: qty 30, 30d supply, fill #1

## 2022-05-02 MED ORDER — DESONIDE 0.05 % EX OINT
TOPICAL_OINTMENT | CUTANEOUS | 2 refills | Status: DC
  Filled 2022-09-27: qty 15, 30d supply, fill #0
  Filled 2022-10-31: qty 15, 30d supply, fill #1

## 2022-05-02 MED ORDER — HYDROXYZINE PAMOATE 25 MG PO CAPS
ORAL_CAPSULE | ORAL | 2 refills | Status: DC
  Filled 2022-09-27: qty 60, 15d supply, fill #0
  Filled 2022-10-16: qty 60, 15d supply, fill #1

## 2022-05-02 MED ORDER — GABAPENTIN 600 MG PO TABS
600.0000 mg | ORAL_TABLET | Freq: Two times a day (BID) | ORAL | 2 refills | Status: DC
  Filled 2022-07-21: qty 60, 30d supply, fill #0
  Filled 2022-08-24: qty 60, 30d supply, fill #1

## 2022-05-02 MED ORDER — ERYTHROMYCIN 5 MG/GM OP OINT: TOPICAL_OINTMENT | OPHTHALMIC | 3 refills | Status: DC

## 2022-05-03 ENCOUNTER — Other Ambulatory Visit (HOSPITAL_BASED_OUTPATIENT_CLINIC_OR_DEPARTMENT_OTHER): Payer: Self-pay

## 2022-05-13 ENCOUNTER — Encounter: Payer: Self-pay | Admitting: Family Medicine

## 2022-05-21 ENCOUNTER — Encounter: Payer: Self-pay | Admitting: Family Medicine

## 2022-05-30 ENCOUNTER — Other Ambulatory Visit (HOSPITAL_BASED_OUTPATIENT_CLINIC_OR_DEPARTMENT_OTHER): Payer: Self-pay

## 2022-05-30 ENCOUNTER — Other Ambulatory Visit: Payer: Self-pay | Admitting: Family Medicine

## 2022-05-30 DIAGNOSIS — I10 Essential (primary) hypertension: Secondary | ICD-10-CM

## 2022-05-30 MED ORDER — ROSUVASTATIN CALCIUM 10 MG PO TABS
10.0000 mg | ORAL_TABLET | Freq: Every day | ORAL | 0 refills | Status: DC
  Filled 2022-05-30: qty 90, 90d supply, fill #0

## 2022-05-30 MED ORDER — AMLODIPINE BESYLATE 10 MG PO TABS
10.0000 mg | ORAL_TABLET | Freq: Every morning | ORAL | 3 refills | Status: DC
  Filled 2022-05-30: qty 90, 90d supply, fill #0

## 2022-06-09 ENCOUNTER — Other Ambulatory Visit (HOSPITAL_BASED_OUTPATIENT_CLINIC_OR_DEPARTMENT_OTHER): Payer: Self-pay

## 2022-06-09 MED FILL — Anastrozole Tab 1 MG: ORAL | 90 days supply | Qty: 90 | Fill #0 | Status: AC

## 2022-06-13 DIAGNOSIS — E291 Testicular hypofunction: Secondary | ICD-10-CM | POA: Diagnosis not present

## 2022-06-18 DIAGNOSIS — N522 Drug-induced erectile dysfunction: Secondary | ICD-10-CM | POA: Diagnosis not present

## 2022-06-18 DIAGNOSIS — E291 Testicular hypofunction: Secondary | ICD-10-CM | POA: Diagnosis not present

## 2022-06-21 ENCOUNTER — Other Ambulatory Visit (HOSPITAL_BASED_OUTPATIENT_CLINIC_OR_DEPARTMENT_OTHER): Payer: Self-pay

## 2022-07-14 ENCOUNTER — Other Ambulatory Visit (HOSPITAL_BASED_OUTPATIENT_CLINIC_OR_DEPARTMENT_OTHER): Payer: Self-pay

## 2022-07-21 ENCOUNTER — Other Ambulatory Visit: Payer: Self-pay | Admitting: Gastroenterology

## 2022-07-21 ENCOUNTER — Other Ambulatory Visit (HOSPITAL_BASED_OUTPATIENT_CLINIC_OR_DEPARTMENT_OTHER): Payer: Self-pay

## 2022-07-21 ENCOUNTER — Other Ambulatory Visit: Payer: Self-pay | Admitting: Family Medicine

## 2022-07-21 DIAGNOSIS — R1310 Dysphagia, unspecified: Secondary | ICD-10-CM

## 2022-07-21 DIAGNOSIS — K2970 Gastritis, unspecified, without bleeding: Secondary | ICD-10-CM

## 2022-07-21 MED FILL — Lisinopril & Hydrochlorothiazide Tab 20-25 MG: ORAL | 90 days supply | Qty: 90 | Fill #0 | Status: AC

## 2022-07-21 MED FILL — Atenolol Tab 100 MG: ORAL | 90 days supply | Qty: 90 | Fill #0 | Status: CN

## 2022-07-21 MED FILL — Anastrozole Tab 1 MG: ORAL | 90 days supply | Qty: 90 | Fill #1 | Status: CN

## 2022-07-21 MED FILL — Clomiphene Citrate Tab 50 MG: ORAL | 84 days supply | Qty: 18 | Fill #0 | Status: AC

## 2022-07-21 MED FILL — Potassium Chloride Microencapsulated Crys ER Tab 10 mEq: ORAL | 90 days supply | Qty: 90 | Fill #0 | Status: AC

## 2022-07-26 ENCOUNTER — Other Ambulatory Visit (HOSPITAL_BASED_OUTPATIENT_CLINIC_OR_DEPARTMENT_OTHER): Payer: Self-pay

## 2022-07-27 ENCOUNTER — Other Ambulatory Visit: Payer: Self-pay | Admitting: Gastroenterology

## 2022-07-27 ENCOUNTER — Other Ambulatory Visit: Payer: Self-pay | Admitting: Family Medicine

## 2022-07-27 DIAGNOSIS — K2970 Gastritis, unspecified, without bleeding: Secondary | ICD-10-CM

## 2022-07-27 DIAGNOSIS — R1310 Dysphagia, unspecified: Secondary | ICD-10-CM

## 2022-07-27 DIAGNOSIS — I10 Essential (primary) hypertension: Secondary | ICD-10-CM

## 2022-08-13 ENCOUNTER — Other Ambulatory Visit (HOSPITAL_BASED_OUTPATIENT_CLINIC_OR_DEPARTMENT_OTHER): Payer: Self-pay

## 2022-08-13 DIAGNOSIS — F410 Panic disorder [episodic paroxysmal anxiety] without agoraphobia: Secondary | ICD-10-CM | POA: Diagnosis not present

## 2022-08-13 DIAGNOSIS — F4310 Post-traumatic stress disorder, unspecified: Secondary | ICD-10-CM | POA: Diagnosis not present

## 2022-08-13 DIAGNOSIS — F411 Generalized anxiety disorder: Secondary | ICD-10-CM | POA: Diagnosis not present

## 2022-08-13 DIAGNOSIS — F4010 Social phobia, unspecified: Secondary | ICD-10-CM | POA: Diagnosis not present

## 2022-08-13 DIAGNOSIS — F339 Major depressive disorder, recurrent, unspecified: Secondary | ICD-10-CM | POA: Diagnosis not present

## 2022-08-13 MED ORDER — DULOXETINE HCL 60 MG PO CPEP
60.0000 mg | ORAL_CAPSULE | Freq: Every day | ORAL | 2 refills | Status: DC
  Filled 2022-08-13: qty 30, 30d supply, fill #0
  Filled 2022-09-12: qty 30, 30d supply, fill #1
  Filled 2022-10-08: qty 30, 30d supply, fill #2

## 2022-08-24 ENCOUNTER — Other Ambulatory Visit (HOSPITAL_BASED_OUTPATIENT_CLINIC_OR_DEPARTMENT_OTHER): Payer: Self-pay

## 2022-08-24 MED FILL — Atenolol Tab 100 MG: ORAL | 90 days supply | Qty: 90 | Fill #0 | Status: AC

## 2022-08-30 MED FILL — Anastrozole Tab 1 MG: ORAL | 90 days supply | Qty: 90 | Fill #1 | Status: AC

## 2022-09-19 ENCOUNTER — Other Ambulatory Visit: Payer: Self-pay | Admitting: Gastroenterology

## 2022-09-19 ENCOUNTER — Other Ambulatory Visit (HOSPITAL_BASED_OUTPATIENT_CLINIC_OR_DEPARTMENT_OTHER): Payer: Self-pay

## 2022-09-19 DIAGNOSIS — R1310 Dysphagia, unspecified: Secondary | ICD-10-CM

## 2022-09-19 DIAGNOSIS — K2970 Gastritis, unspecified, without bleeding: Secondary | ICD-10-CM

## 2022-09-21 ENCOUNTER — Other Ambulatory Visit (HOSPITAL_BASED_OUTPATIENT_CLINIC_OR_DEPARTMENT_OTHER): Payer: Self-pay

## 2022-09-25 ENCOUNTER — Other Ambulatory Visit: Payer: Self-pay | Admitting: Gastroenterology

## 2022-09-25 ENCOUNTER — Other Ambulatory Visit: Payer: Self-pay | Admitting: Family Medicine

## 2022-09-25 ENCOUNTER — Other Ambulatory Visit: Payer: Self-pay

## 2022-09-25 ENCOUNTER — Other Ambulatory Visit (HOSPITAL_BASED_OUTPATIENT_CLINIC_OR_DEPARTMENT_OTHER): Payer: Self-pay

## 2022-09-25 DIAGNOSIS — K2970 Gastritis, unspecified, without bleeding: Secondary | ICD-10-CM

## 2022-09-25 DIAGNOSIS — R1310 Dysphagia, unspecified: Secondary | ICD-10-CM

## 2022-09-25 MED ORDER — GABAPENTIN 600 MG PO TABS
600.0000 mg | ORAL_TABLET | Freq: Two times a day (BID) | ORAL | 1 refills | Status: DC
  Filled 2022-09-25: qty 180, 90d supply, fill #0

## 2022-09-25 MED ORDER — GABAPENTIN 600 MG PO TABS
600.0000 mg | ORAL_TABLET | Freq: Two times a day (BID) | ORAL | 0 refills | Status: DC
  Filled 2022-09-25: qty 120, 60d supply, fill #0

## 2022-09-25 MED ORDER — PANTOPRAZOLE SODIUM 40 MG PO TBEC
40.0000 mg | DELAYED_RELEASE_TABLET | Freq: Two times a day (BID) | ORAL | 0 refills | Status: DC
  Filled 2022-09-25: qty 60, 30d supply, fill #0

## 2022-09-26 ENCOUNTER — Other Ambulatory Visit (HOSPITAL_BASED_OUTPATIENT_CLINIC_OR_DEPARTMENT_OTHER): Payer: Self-pay

## 2022-09-27 ENCOUNTER — Other Ambulatory Visit (HOSPITAL_BASED_OUTPATIENT_CLINIC_OR_DEPARTMENT_OTHER): Payer: Self-pay

## 2022-09-27 ENCOUNTER — Other Ambulatory Visit: Payer: Self-pay

## 2022-10-04 ENCOUNTER — Encounter (HOSPITAL_BASED_OUTPATIENT_CLINIC_OR_DEPARTMENT_OTHER): Payer: Self-pay

## 2022-10-04 ENCOUNTER — Other Ambulatory Visit (HOSPITAL_BASED_OUTPATIENT_CLINIC_OR_DEPARTMENT_OTHER): Payer: Self-pay

## 2022-10-08 ENCOUNTER — Other Ambulatory Visit (HOSPITAL_BASED_OUTPATIENT_CLINIC_OR_DEPARTMENT_OTHER): Payer: Self-pay

## 2022-10-11 ENCOUNTER — Other Ambulatory Visit (HOSPITAL_BASED_OUTPATIENT_CLINIC_OR_DEPARTMENT_OTHER): Payer: Self-pay

## 2022-10-11 MED FILL — Clomiphene Citrate Tab 50 MG: ORAL | 84 days supply | Qty: 18 | Fill #1 | Status: AC

## 2022-10-17 ENCOUNTER — Other Ambulatory Visit: Payer: Self-pay | Admitting: Family Medicine

## 2022-10-17 ENCOUNTER — Ambulatory Visit: Payer: Commercial Managed Care - PPO | Admitting: Family Medicine

## 2022-10-17 ENCOUNTER — Encounter: Payer: Self-pay | Admitting: Family Medicine

## 2022-10-17 ENCOUNTER — Other Ambulatory Visit (HOSPITAL_BASED_OUTPATIENT_CLINIC_OR_DEPARTMENT_OTHER): Payer: Self-pay

## 2022-10-17 ENCOUNTER — Telehealth: Payer: Self-pay

## 2022-10-17 VITALS — BP 138/79 | HR 72 | Ht 72.0 in | Wt 255.0 lb

## 2022-10-17 DIAGNOSIS — I10 Essential (primary) hypertension: Secondary | ICD-10-CM | POA: Diagnosis not present

## 2022-10-17 DIAGNOSIS — E119 Type 2 diabetes mellitus without complications: Secondary | ICD-10-CM

## 2022-10-17 DIAGNOSIS — K2970 Gastritis, unspecified, without bleeding: Secondary | ICD-10-CM | POA: Diagnosis not present

## 2022-10-17 DIAGNOSIS — R1310 Dysphagia, unspecified: Secondary | ICD-10-CM

## 2022-10-17 DIAGNOSIS — K2990 Gastroduodenitis, unspecified, without bleeding: Secondary | ICD-10-CM

## 2022-10-17 DIAGNOSIS — M7701 Medial epicondylitis, right elbow: Secondary | ICD-10-CM | POA: Diagnosis not present

## 2022-10-17 DIAGNOSIS — M7711 Lateral epicondylitis, right elbow: Secondary | ICD-10-CM | POA: Diagnosis not present

## 2022-10-17 DIAGNOSIS — E781 Pure hyperglyceridemia: Secondary | ICD-10-CM | POA: Diagnosis not present

## 2022-10-17 MED ORDER — TIRZEPATIDE 5 MG/0.5ML ~~LOC~~ SOAJ
5.0000 mg | SUBCUTANEOUS | 0 refills | Status: DC
  Filled 2022-10-17 – 2022-11-10 (×5): qty 2, 28d supply, fill #0

## 2022-10-17 MED ORDER — TIRZEPATIDE 5 MG/0.5ML ~~LOC~~ SOAJ
5.0000 mg | SUBCUTANEOUS | 0 refills | Status: DC
  Filled 2022-10-17: qty 2, 28d supply, fill #0

## 2022-10-17 MED ORDER — LISINOPRIL-HYDROCHLOROTHIAZIDE 20-25 MG PO TABS
1.0000 | ORAL_TABLET | Freq: Every day | ORAL | 3 refills | Status: DC
  Filled 2022-10-17: qty 90, 90d supply, fill #0
  Filled 2023-01-12: qty 90, 90d supply, fill #1
  Filled 2023-04-24: qty 90, 90d supply, fill #2
  Filled 2023-07-28: qty 90, 90d supply, fill #3

## 2022-10-17 MED ORDER — TIRZEPATIDE 2.5 MG/0.5ML ~~LOC~~ SOAJ
2.5000 mg | SUBCUTANEOUS | 0 refills | Status: DC
  Filled 2022-10-17 – 2022-10-18 (×3): qty 2, 28d supply, fill #0

## 2022-10-17 MED ORDER — TIRZEPATIDE 7.5 MG/0.5ML ~~LOC~~ SOAJ
7.5000 mg | SUBCUTANEOUS | 1 refills | Status: DC
  Filled 2022-10-17 – 2023-01-12 (×4): qty 2, 28d supply, fill #0

## 2022-10-17 MED ORDER — POTASSIUM CHLORIDE CRYS ER 10 MEQ PO TBCR
10.0000 meq | EXTENDED_RELEASE_TABLET | Freq: Every day | ORAL | 1 refills | Status: DC
  Filled 2022-10-17: qty 90, 90d supply, fill #0
  Filled 2023-01-15: qty 90, 90d supply, fill #1

## 2022-10-17 MED ORDER — PANTOPRAZOLE SODIUM 40 MG PO TBEC
40.0000 mg | DELAYED_RELEASE_TABLET | Freq: Two times a day (BID) | ORAL | 3 refills | Status: DC
  Filled 2022-10-17 – 2022-10-19 (×2): qty 60, 30d supply, fill #0
  Filled 2022-11-19: qty 60, 30d supply, fill #1
  Filled 2022-12-18: qty 60, 30d supply, fill #2
  Filled 2023-01-14: qty 60, 30d supply, fill #3

## 2022-10-17 NOTE — Assessment & Plan Note (Signed)
Diabetes has been better controlled since gastric sleeve.  Has been completed.  Updating A1c.  Adding Mounjaro.  Titrate as tolerated.

## 2022-10-17 NOTE — Assessment & Plan Note (Signed)
Blood pressure is well-controlled at this time.  Recommend continuation of current antihypertensive regimen

## 2022-10-17 NOTE — Assessment & Plan Note (Signed)
Recommend use of counterforce brace.  He may try Voltaren gel to the area.  We did discuss PRP injection if refractory to conservative management.

## 2022-10-17 NOTE — Telephone Encounter (Signed)
Initiated Prior authorization [id number] 2.'5MG'$ /0.5ML pen-injectors Via: Covermymeds Case/Key:[id number]  Status: Pending as of 10/17/22 Reason: Notified Pt via: Mychart

## 2022-10-17 NOTE — Progress Notes (Signed)
Tyler Gross - 47 y.o. male MRN [id number]  Date of birth: 1975-08-21  Subjective Chief Complaint  Patient presents with   Diabetes    HPI Tyler Gross is a 47 year old male here today for follow-up visit.   Reports he is doing pretty well.  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  He has had some issues with his right elbow for the past few months.  He was lifting some boxes around Christmas and noted some pain afterwards.  He has tried massage as well as some icing with only temporary relief.  Pain is worse with lifting and extension of the forearm.  He has trouble with his weight.  History of gastric sleeve.  Unfortunately, he was unable to lose a significant amount of weight with this.  He does have history of diabetes.  He would like to add Mounjaro on to help with management of his blood sugars as well as better control his weight.  Continues on Crestor and is tolerating well.  Continues on combination of atenolol, amlodipine and lisinopril/hydrochlorothiazide for management of hypertension.  He is doing well with these.  He denies chest pain, shortness of breath, palpitations, headaches or vision changes.  ROS:  A comprehensive ROS was completed and negative except as noted per HPI     Allergies  Allergen Reactions   Atorvastatin Other (See Comments)    Muscle fatigue   Bee Venom Swelling   Sulfamethoxazole-Trimethoprim Hives    Bactrim     Past Medical History:  Diagnosis Date   Acne    on back taking doxycycline for   Alcoholism [facility])    sober since 04/2018   Anxiety    Depression    DM type 2 (diabetes mellitus, type 2) ([place])    ED (erectile dysfunction)    GAD (generalized anxiety disorder)    GERD (gastroesophageal reflux disease)    Gynecomastia, male    followed by dr Shellia Cleverly   High serum estradiol    resolved   History of anal fissures    History of kidney stones    History of small bowel obstruction  2004   Hypertension    IBS (irritable bowel syndrome)    Mixed hyperlipidemia    OSA on CPAP    per last study 07-31-2013  severe osa   Primary hypogonadism in male    endocrinologist-  dr Shellia Cleverly    Past Surgical History:  Procedure Laterality Date   CARDIOVASCULAR STRESS TEST  10/05/2009   normal nuclear study w/ no ischemia/  normal LV function and wall motion , ef 71%   COLONOSCOPY  last one   2004, april 2021   EVALUATION UNDER ANESTHESIA WITH ANAL FISTULECTOMY N/A 02/13/2018   Procedure: ANAL EXAM UNDER ANESTHESIA WITH BIOPSY;  Surgeon: [name], MD;  Location: [facility];  Service: General;  Laterality: N/A;   ORCHIECTOMY Left 1991   w/ placement prosthesis (for torsion)   TESTICULAR EXPLORATION Left 01/29/2020   Procedure: EXCHANGE OF LEFT TESTICULAR PROSTHESIS;  Surgeon: Cleon Gustin, MD;  Location[facility];  Service: Urology;  Laterality: Left;   UPPER GI ENDOSCOPY N/A 06/05/2021   Procedure: UPPER GI ENDOSCOPY;  Surgeon: Johnathan Hausen, MD;  [facility];  Service: General;  Laterality: N/A;   URETEROLITHOTOMY  1999    Social History   Socioeconomic History   Marital status: Single    Spouse name: Not on file  Number of children: Not on file   Years of education: Not on file   Highest education level: Not on file  Occupational History   Occupation: RN   Tobacco Use   Smoking status: Former    Packs/day: 1.00    Years: 10.00    Additional pack years: 0.00    Total pack years: 10.00    Types: Cigarettes    Quit date: 02/07/2009    Years since quitting: 13.6   Smokeless tobacco: Never  Vaping Use   Vaping Use: Never used  Substance and Sexual Activity   Alcohol use: Not Currently   Drug use: No   Sexual activity: Yes    Partners: Male  Other Topics Concern   Not on file  Social History Narrative   Lives with partner   Regular exercise: no   Caffeine use: 1 large cup of coffee daily; 2 to 3 sodas in the  evening   Social Determinants of Health   Financial Resource Strain: Not on file  Food Insecurity: Not on file  Transportation Needs: Not on file  Physical Activity: Not on file  Stress: Not on file  Social Connections: Not on file    Family History  Problem Relation Age of Onset   Cancer Mother        breast cancer: stage I   Hypertension Mother    Irritable bowel syndrome Mother    Heart disease Maternal Grandfather    Diabetes Paternal Grandmother    Colon cancer Maternal Aunt        great aunt   Esophageal cancer Neg Hx    Rectal cancer Neg Hx    Stomach cancer Neg Hx     Health Maintenance  Topic Date Due   Diabetic kidney evaluation - eGFR measurement  10/17/2022   HEMOGLOBIN A1C  10/17/2022   OPHTHALMOLOGY EXAM  04/19/2023 (Originally 06/28/2022)   Hepatitis C Screening  04/19/2023 (Originally 11/16/1992)   COVID-19 Vaccine (4 - 2023-24 season) 11/02/2023 (Originally 04/06/2022)   Diabetic kidney evaluation - Urine ACR  04/19/2023   FOOT EXAM  04/19/2023   DTaP/Tdap/Td (2 - Td or Tdap) 01/27/2024   COLONOSCOPY (Pts 45-23yr Insurance coverage Tyler Gross need to be confirmed)  10/07/2029   INFLUENZA VACCINE  Completed   HIV Screening  Completed   HPV VACCINES  Aged Out     ----------------------------------------------------------------------------------------------------------------------------------------------------------------------------------------------------------------- Physical Exam BP 138/79 (BP Location: Left Arm, Patient Position: Sitting, Cuff Size: Large)   Pulse 72   Ht 6' (1.829 m)   Wt 255 lb (115.7 kg)   SpO2 99%   BMI 34.58 kg/m   Physical Exam Constitutional:      Appearance: Normal appearance.  HENT:     Head: Normocephalic and atraumatic.  Eyes:     General: No scleral icterus. Cardiovascular:     Rate and Rhythm: Normal rate and regular rhythm.  Pulmonary:     Effort: Pulmonary effort is normal.     Breath sounds: Normal breath  sounds.  Musculoskeletal:     Cervical back: Neck supple.     Comments: Mild tenderness to palpation along the lateral epicondyle.  Pain with resisted wrist extension as well as resisted pronation.  Neurological:     Mental Status: He is alert.  Psychiatric:        Mood and Affect: Mood normal.        Behavior: Behavior normal.     ------------------------------------------------------------------------------------------------------------------------------------------------------------------------------------------------------------------- Assessment and Plan  Essential hypertension Blood pressure is well-controlled at this  time.  Recommend continuation of current antihypertensive regimen    Type 2 diabetes mellitus without complication, without long-term current use of insulin ([place]) Diabetes has been better controlled since gastric sleeve.  Has been completed.  Updating A1c.  Adding Mounjaro.  Titrate as tolerated.  Lateral epicondylitis of right elbow Recommend use of counterforce brace.  He may try Voltaren gel to the area.  We did discuss PRP injection if refractory to conservative management.   Meds ordered this encounter  Medications   tirzepatide (MOUNJARO) 2.5 MG/0.5ML Pen    Sig: Inject 2.5 mg into the skin once a week. Increase to '5mg'$  after 4 weeks    Dispense:  2 mL    Refill:  0   DISCONTD: tirzepatide (MOUNJARO) 5 MG/0.5ML Pen    Sig: Inject 5 mg into the skin once a week. Increase to '5mg'$  after 4 weeks    Dispense:  2 mL    Refill:  0   tirzepatide (MOUNJARO) 7.5 MG/0.5ML Pen    Sig: Inject 7.5 mg into the skin once a week.    Dispense:  2 mL    Refill:  1   pantoprazole (PROTONIX) 40 MG tablet    Sig: Take 1 tablet (40 mg total) by mouth 2 (two) times daily.    Dispense:  60 tablet    Refill:  3   tirzepatide (MOUNJARO) 5 MG/0.5ML Pen    Sig: Inject 5 mg into the skin once a week. Increase to 7.'5mg'$  after 4 weeks    Dispense:  2 mL    Refill:  0     Return in about 4 months (around 02/16/2023) for F/u Mounjaro/T2Dm.    This visit occurred during the SARS-CoV-2 public health emergency.  Safety protocols were in place, including screening questions prior to the visit, additional usage of staff PPE, and extensive cleaning of exam room while observing appropriate contact time as indicated for disinfecting solutions.

## 2022-10-18 ENCOUNTER — Other Ambulatory Visit (HOSPITAL_BASED_OUTPATIENT_CLINIC_OR_DEPARTMENT_OTHER): Payer: Self-pay

## 2022-10-19 ENCOUNTER — Other Ambulatory Visit (HOSPITAL_BASED_OUTPATIENT_CLINIC_OR_DEPARTMENT_OTHER): Payer: Self-pay

## 2022-10-19 LAB — CBC WITH DIFFERENTIAL/PLATELET
Basophils Absolute: 0.1 10*3/uL (ref 0.0–0.2)
Basos: 1 %
EOS (ABSOLUTE): 0.2 10*3/uL (ref 0.0–0.4)
Eos: 2 %
Hematocrit: 44.6 % (ref 37.5–51.0)
Hemoglobin: 14.4 g/dL (ref 13.0–17.7)
Immature Grans (Abs): 0 10*3/uL (ref 0.0–0.1)
Immature Granulocytes: 0 %
Lymphocytes Absolute: 2.1 10*3/uL (ref 0.7–3.1)
Lymphs: 24 %
MCH: 28.7 pg (ref 26.6–33.0)
MCHC: 32.3 g/dL (ref 31.5–35.7)
MCV: 89 fL (ref 79–97)
Monocytes Absolute: 0.6 10*3/uL (ref 0.1–0.9)
Monocytes: 7 %
Neutrophils Absolute: 5.8 10*3/uL (ref 1.4–7.0)
Neutrophils: 66 %
Platelets: 273 10*3/uL (ref 150–450)
RBC: 5.02 x10E6/uL (ref 4.14–5.80)
RDW: 13.1 % (ref 11.6–15.4)
WBC: 8.9 10*3/uL (ref 3.4–10.8)

## 2022-10-19 LAB — CMP14+EGFR
ALT: 55 IU/L — ABNORMAL HIGH (ref 0–44)
AST: 79 IU/L — ABNORMAL HIGH (ref 0–40)
Albumin/Globulin Ratio: 2.2 (ref 1.2–2.2)
Albumin: 4.9 g/dL (ref 4.1–5.1)
Alkaline Phosphatase: 85 IU/L (ref 44–121)
BUN/Creatinine Ratio: 13 (ref 9–20)
BUN: 13 mg/dL (ref 6–24)
Bilirubin Total: 0.5 mg/dL (ref 0.0–1.2)
CO2: 25 mmol/L (ref 20–29)
Calcium: 9.7 mg/dL (ref 8.7–10.2)
Chloride: 102 mmol/L (ref 96–106)
Creatinine, Ser: 1.01 mg/dL (ref 0.76–1.27)
Globulin, Total: 2.2 g/dL (ref 1.5–4.5)
Glucose: 122 mg/dL — ABNORMAL HIGH (ref 70–99)
Potassium: 4.5 mmol/L (ref 3.5–5.2)
Sodium: 144 mmol/L (ref 134–144)
Total Protein: 7.1 g/dL (ref 6.0–8.5)
eGFR: 92 mL/min/{1.73_m2} (ref >59)

## 2022-10-19 LAB — LIPID PANEL WITH LDL/HDL RATIO
Cholesterol, Total: 182 mg/dL (ref 100–199)
HDL: 43 mg/dL (ref >39)
LDL Chol Calc (NIH): 93 mg/dL (ref 0–99)
LDL/HDL Ratio: 2.2 ratio (ref 0.0–3.6)
Triglycerides: 273 mg/dL — ABNORMAL HIGH (ref 0–149)
VLDL Cholesterol Cal: 46 mg/dL — ABNORMAL HIGH (ref 5–40)

## 2022-10-19 LAB — TSH: TSH: 1.4 u[IU]/mL (ref 0.450–4.500)

## 2022-10-19 LAB — HEMOGLOBIN A1C
Est. average glucose Bld gHb Est-mCnc: 143 mg/dL
Hgb A1c MFr Bld: 6.6 % — ABNORMAL HIGH (ref 4.8–5.6)

## 2022-10-19 LAB — MICROALBUMIN / CREATININE URINE RATIO
Creatinine, Urine: 70.2 mg/dL
Microalb/Creat Ratio: 76 mg/g creat — ABNORMAL HIGH (ref 0–29)
Microalbumin, Urine: 53.4 ug/mL

## 2022-11-07 ENCOUNTER — Other Ambulatory Visit (HOSPITAL_BASED_OUTPATIENT_CLINIC_OR_DEPARTMENT_OTHER): Payer: Self-pay

## 2022-11-07 DIAGNOSIS — L0889 Other specified local infections of the skin and subcutaneous tissue: Secondary | ICD-10-CM | POA: Diagnosis not present

## 2022-11-07 DIAGNOSIS — L720 Epidermal cyst: Secondary | ICD-10-CM | POA: Diagnosis not present

## 2022-11-07 MED ORDER — DOXYCYCLINE HYCLATE 100 MG PO CAPS
ORAL_CAPSULE | ORAL | 0 refills | Status: DC
  Filled 2022-11-07: qty 10, 5d supply, fill #0

## 2022-11-07 MED ORDER — DOXYCYCLINE HYCLATE 100 MG PO CAPS
100.0000 mg | ORAL_CAPSULE | Freq: Two times a day (BID) | ORAL | 0 refills | Status: DC
  Filled 2022-11-07: qty 28, 14d supply, fill #0

## 2022-11-09 ENCOUNTER — Other Ambulatory Visit (HOSPITAL_BASED_OUTPATIENT_CLINIC_OR_DEPARTMENT_OTHER): Payer: Self-pay

## 2022-11-10 ENCOUNTER — Other Ambulatory Visit (HOSPITAL_BASED_OUTPATIENT_CLINIC_OR_DEPARTMENT_OTHER): Payer: Self-pay

## 2022-11-11 ENCOUNTER — Other Ambulatory Visit (HOSPITAL_BASED_OUTPATIENT_CLINIC_OR_DEPARTMENT_OTHER): Payer: Self-pay

## 2022-11-14 ENCOUNTER — Other Ambulatory Visit (HOSPITAL_BASED_OUTPATIENT_CLINIC_OR_DEPARTMENT_OTHER): Payer: Self-pay

## 2022-11-14 DIAGNOSIS — L0889 Other specified local infections of the skin and subcutaneous tissue: Secondary | ICD-10-CM | POA: Diagnosis not present

## 2022-11-14 MED ORDER — GABAPENTIN 600 MG PO TABS
600.0000 mg | ORAL_TABLET | Freq: Two times a day (BID) | ORAL | 0 refills | Status: DC
  Filled 2022-11-14: qty 180, 90d supply, fill #0

## 2022-11-14 MED ORDER — DOXYCYCLINE HYCLATE 100 MG PO CAPS
100.0000 mg | ORAL_CAPSULE | Freq: Two times a day (BID) | ORAL | 0 refills | Status: DC
  Filled 2022-11-14: qty 28, 14d supply, fill #0

## 2022-11-14 MED ORDER — DULOXETINE HCL 60 MG PO CPEP
60.0000 mg | ORAL_CAPSULE | Freq: Every day | ORAL | 0 refills | Status: DC
  Filled 2022-11-14: qty 90, 90d supply, fill #0

## 2022-11-19 ENCOUNTER — Other Ambulatory Visit: Payer: Self-pay | Admitting: Family Medicine

## 2022-11-19 ENCOUNTER — Other Ambulatory Visit (HOSPITAL_BASED_OUTPATIENT_CLINIC_OR_DEPARTMENT_OTHER): Payer: Self-pay

## 2022-11-19 ENCOUNTER — Encounter: Payer: Self-pay | Admitting: *Deleted

## 2022-11-19 ENCOUNTER — Other Ambulatory Visit: Payer: Self-pay

## 2022-11-19 MED ORDER — ATENOLOL 100 MG PO TABS
100.0000 mg | ORAL_TABLET | ORAL | 1 refills | Status: DC
  Filled 2022-11-19: qty 90, 90d supply, fill #0
  Filled 2023-02-16 (×2): qty 90, 90d supply, fill #1

## 2022-11-29 MED FILL — Anastrozole Tab 1 MG: ORAL | 90 days supply | Qty: 90 | Fill #2 | Status: AC

## 2022-12-10 DIAGNOSIS — E291 Testicular hypofunction: Secondary | ICD-10-CM | POA: Diagnosis not present

## 2022-12-12 ENCOUNTER — Other Ambulatory Visit (HOSPITAL_BASED_OUTPATIENT_CLINIC_OR_DEPARTMENT_OTHER): Payer: Self-pay

## 2022-12-12 ENCOUNTER — Encounter: Payer: Self-pay | Admitting: Family Medicine

## 2022-12-13 ENCOUNTER — Other Ambulatory Visit (HOSPITAL_BASED_OUTPATIENT_CLINIC_OR_DEPARTMENT_OTHER): Payer: Self-pay

## 2022-12-17 ENCOUNTER — Other Ambulatory Visit (HOSPITAL_BASED_OUTPATIENT_CLINIC_OR_DEPARTMENT_OTHER): Payer: Self-pay

## 2022-12-17 MED ORDER — TIRZEPATIDE 10 MG/0.5ML ~~LOC~~ SOAJ
10.0000 mg | SUBCUTANEOUS | 1 refills | Status: DC
  Filled 2022-12-17 – 2022-12-19 (×4): qty 2, 28d supply, fill #0
  Filled 2023-01-15 – 2023-02-01 (×2): qty 2, 28d supply, fill #1
  Filled 2023-02-28: qty 2, 28d supply, fill #2
  Filled 2023-03-26: qty 2, 28d supply, fill #3
  Filled 2023-04-24: qty 2, 28d supply, fill #4
  Filled 2023-05-17: qty 2, 28d supply, fill #5

## 2022-12-18 ENCOUNTER — Other Ambulatory Visit (HOSPITAL_BASED_OUTPATIENT_CLINIC_OR_DEPARTMENT_OTHER): Payer: Self-pay

## 2022-12-18 ENCOUNTER — Other Ambulatory Visit: Payer: Self-pay

## 2022-12-19 ENCOUNTER — Other Ambulatory Visit (HOSPITAL_BASED_OUTPATIENT_CLINIC_OR_DEPARTMENT_OTHER): Payer: Self-pay

## 2022-12-20 ENCOUNTER — Encounter: Payer: Self-pay | Admitting: Family Medicine

## 2022-12-20 ENCOUNTER — Other Ambulatory Visit (HOSPITAL_BASED_OUTPATIENT_CLINIC_OR_DEPARTMENT_OTHER): Payer: Self-pay

## 2022-12-21 ENCOUNTER — Other Ambulatory Visit (HOSPITAL_BASED_OUTPATIENT_CLINIC_OR_DEPARTMENT_OTHER): Payer: Self-pay

## 2022-12-21 ENCOUNTER — Other Ambulatory Visit: Payer: Self-pay | Admitting: Family Medicine

## 2022-12-21 MED ORDER — ROSUVASTATIN CALCIUM 10 MG PO TABS
10.0000 mg | ORAL_TABLET | Freq: Every day | ORAL | 2 refills | Status: DC
  Filled 2022-12-21: qty 90, 90d supply, fill #0
  Filled 2023-03-16 (×2): qty 90, 90d supply, fill #1
  Filled 2023-06-26: qty 90, 90d supply, fill #2

## 2022-12-21 MED ORDER — SEMAGLUTIDE (2 MG/DOSE) 8 MG/3ML ~~LOC~~ SOPN
2.0000 mg | PEN_INJECTOR | SUBCUTANEOUS | 1 refills | Status: DC
  Filled 2022-12-21: qty 3, fill #0
  Filled 2022-12-22 – 2022-12-26 (×4): qty 3, 28d supply, fill #0
  Filled 2022-12-27: qty 3, fill #0
  Filled 2023-01-12: qty 3, 28d supply, fill #0

## 2022-12-21 MED ORDER — SEMAGLUTIDE (1 MG/DOSE) 4 MG/3ML ~~LOC~~ SOPN
1.0000 mg | PEN_INJECTOR | SUBCUTANEOUS | 0 refills | Status: DC
  Filled 2022-12-21: qty 3, 28d supply, fill #0

## 2022-12-22 ENCOUNTER — Other Ambulatory Visit (HOSPITAL_BASED_OUTPATIENT_CLINIC_OR_DEPARTMENT_OTHER): Payer: Self-pay

## 2022-12-22 ENCOUNTER — Encounter (HOSPITAL_BASED_OUTPATIENT_CLINIC_OR_DEPARTMENT_OTHER): Payer: Self-pay

## 2022-12-24 ENCOUNTER — Other Ambulatory Visit: Payer: Self-pay

## 2022-12-25 ENCOUNTER — Other Ambulatory Visit (HOSPITAL_BASED_OUTPATIENT_CLINIC_OR_DEPARTMENT_OTHER): Payer: Self-pay

## 2022-12-25 ENCOUNTER — Other Ambulatory Visit: Payer: Self-pay

## 2022-12-25 DIAGNOSIS — E291 Testicular hypofunction: Secondary | ICD-10-CM | POA: Diagnosis not present

## 2022-12-25 MED ORDER — TADALAFIL 20 MG PO TABS
10.0000 mg | ORAL_TABLET | ORAL | 1 refills | Status: DC
  Filled 2022-12-25: qty 30, 30d supply, fill #0
  Filled 2023-01-17: qty 4, 30d supply, fill #1
  Filled 2023-02-16 (×2): qty 4, 30d supply, fill #2
  Filled 2023-03-18: qty 4, 30d supply, fill #3
  Filled 2023-04-17: qty 4, 30d supply, fill #4
  Filled 2023-05-20: qty 4, 30d supply, fill #5
  Filled 2023-06-18 – 2023-06-21 (×2): qty 4, 30d supply, fill #6
  Filled 2023-07-22: qty 4, 30d supply, fill #7
  Filled 2023-08-22: qty 2, 15d supply, fill #8

## 2022-12-26 ENCOUNTER — Other Ambulatory Visit (HOSPITAL_BASED_OUTPATIENT_CLINIC_OR_DEPARTMENT_OTHER): Payer: Self-pay

## 2022-12-27 ENCOUNTER — Other Ambulatory Visit (HOSPITAL_BASED_OUTPATIENT_CLINIC_OR_DEPARTMENT_OTHER): Payer: Self-pay

## 2023-01-01 ENCOUNTER — Other Ambulatory Visit: Payer: Self-pay | Admitting: Family Medicine

## 2023-01-01 ENCOUNTER — Encounter: Payer: Self-pay | Admitting: Family Medicine

## 2023-01-01 MED ORDER — AMBULATORY NON FORMULARY MEDICATION: 0 refills | Status: DC

## 2023-01-01 NOTE — Telephone Encounter (Signed)
Faxed to [facility] at given fax # =[phone]

## 2023-01-03 ENCOUNTER — Other Ambulatory Visit (HOSPITAL_BASED_OUTPATIENT_CLINIC_OR_DEPARTMENT_OTHER): Payer: Self-pay

## 2023-01-03 MED FILL — Clomiphene Citrate Tab 50 MG: ORAL | 84 days supply | Qty: 18 | Fill #2 | Status: AC

## 2023-01-05 ENCOUNTER — Other Ambulatory Visit (HOSPITAL_BASED_OUTPATIENT_CLINIC_OR_DEPARTMENT_OTHER): Payer: Self-pay

## 2023-01-12 ENCOUNTER — Other Ambulatory Visit: Payer: Self-pay

## 2023-01-12 ENCOUNTER — Other Ambulatory Visit (HOSPITAL_BASED_OUTPATIENT_CLINIC_OR_DEPARTMENT_OTHER): Payer: Self-pay

## 2023-01-13 ENCOUNTER — Other Ambulatory Visit (HOSPITAL_BASED_OUTPATIENT_CLINIC_OR_DEPARTMENT_OTHER): Payer: Self-pay

## 2023-01-14 ENCOUNTER — Other Ambulatory Visit (HOSPITAL_BASED_OUTPATIENT_CLINIC_OR_DEPARTMENT_OTHER): Payer: Self-pay

## 2023-01-15 ENCOUNTER — Encounter (HOSPITAL_COMMUNITY): Payer: Self-pay | Admitting: *Deleted

## 2023-01-15 ENCOUNTER — Other Ambulatory Visit (HOSPITAL_BASED_OUTPATIENT_CLINIC_OR_DEPARTMENT_OTHER): Payer: Self-pay

## 2023-01-16 ENCOUNTER — Other Ambulatory Visit (HOSPITAL_BASED_OUTPATIENT_CLINIC_OR_DEPARTMENT_OTHER): Payer: Self-pay

## 2023-01-16 MED ORDER — PHENTERMINE HCL 15 MG PO CAPS
15.0000 mg | ORAL_CAPSULE | Freq: Every day | ORAL | 0 refills | Status: DC
  Filled 2023-01-16: qty 30, 30d supply, fill #0

## 2023-01-17 ENCOUNTER — Other Ambulatory Visit (HOSPITAL_BASED_OUTPATIENT_CLINIC_OR_DEPARTMENT_OTHER): Payer: Self-pay

## 2023-01-17 ENCOUNTER — Encounter: Payer: Self-pay | Admitting: Family Medicine

## 2023-01-17 NOTE — Telephone Encounter (Signed)
Tyler Gross or Tyler Gross-Will you please look into this   Thanks!  CM

## 2023-01-18 NOTE — Telephone Encounter (Signed)
Please keep patient updated on status.  Thanks!  CM

## 2023-01-21 ENCOUNTER — Other Ambulatory Visit (HOSPITAL_BASED_OUTPATIENT_CLINIC_OR_DEPARTMENT_OTHER): Payer: Self-pay

## 2023-01-21 ENCOUNTER — Telehealth: Payer: Self-pay

## 2023-01-21 NOTE — Telephone Encounter (Signed)
Initiated Prior authorization [id number] 7.5MG /0.5ML pen-injectors Via: Covermymeds Case/Key:[id number] Status: approved  as of 01/21/23 Reason: Notified Pt via: Mychart

## 2023-01-22 ENCOUNTER — Other Ambulatory Visit (HOSPITAL_BASED_OUTPATIENT_CLINIC_OR_DEPARTMENT_OTHER): Payer: Self-pay

## 2023-01-23 ENCOUNTER — Other Ambulatory Visit (HOSPITAL_BASED_OUTPATIENT_CLINIC_OR_DEPARTMENT_OTHER): Payer: Self-pay

## 2023-01-25 ENCOUNTER — Other Ambulatory Visit (HOSPITAL_BASED_OUTPATIENT_CLINIC_OR_DEPARTMENT_OTHER): Payer: Self-pay

## 2023-01-25 DIAGNOSIS — R5383 Other fatigue: Secondary | ICD-10-CM | POA: Diagnosis not present

## 2023-01-25 DIAGNOSIS — F5081 Binge eating disorder: Secondary | ICD-10-CM | POA: Diagnosis not present

## 2023-01-25 DIAGNOSIS — Z79899 Other long term (current) drug therapy: Secondary | ICD-10-CM | POA: Diagnosis not present

## 2023-01-25 DIAGNOSIS — E291 Testicular hypofunction: Secondary | ICD-10-CM | POA: Diagnosis not present

## 2023-01-25 DIAGNOSIS — E6609 Other obesity due to excess calories: Secondary | ICD-10-CM | POA: Diagnosis not present

## 2023-01-25 DIAGNOSIS — F320 Major depressive disorder, single episode, mild: Secondary | ICD-10-CM | POA: Diagnosis not present

## 2023-01-25 MED ORDER — PHENTERMINE HCL 30 MG PO CAPS
30.0000 mg | ORAL_CAPSULE | Freq: Every day | ORAL | 1 refills | Status: DC
  Filled 2023-01-25: qty 30, 30d supply, fill #0
  Filled 2023-03-02: qty 30, 30d supply, fill #1

## 2023-01-28 ENCOUNTER — Other Ambulatory Visit (HOSPITAL_BASED_OUTPATIENT_CLINIC_OR_DEPARTMENT_OTHER): Payer: Self-pay

## 2023-01-28 MED ORDER — VITAMIN D (ERGOCALCIFEROL) 1.25 MG (50000 UNIT) PO CAPS
50000.0000 [IU] | ORAL_CAPSULE | ORAL | 2 refills | Status: DC
  Filled 2023-01-28: qty 13, 90d supply, fill #0
  Filled 2023-04-22: qty 13, 90d supply, fill #1
  Filled 2023-07-22: qty 13, 90d supply, fill #2

## 2023-02-01 ENCOUNTER — Other Ambulatory Visit: Payer: Self-pay | Admitting: Family Medicine

## 2023-02-01 ENCOUNTER — Other Ambulatory Visit: Payer: Self-pay

## 2023-02-01 DIAGNOSIS — I10 Essential (primary) hypertension: Secondary | ICD-10-CM

## 2023-02-04 ENCOUNTER — Other Ambulatory Visit (HOSPITAL_BASED_OUTPATIENT_CLINIC_OR_DEPARTMENT_OTHER): Payer: Self-pay

## 2023-02-04 DIAGNOSIS — F411 Generalized anxiety disorder: Secondary | ICD-10-CM | POA: Diagnosis not present

## 2023-02-04 DIAGNOSIS — F339 Major depressive disorder, recurrent, unspecified: Secondary | ICD-10-CM | POA: Diagnosis not present

## 2023-02-04 DIAGNOSIS — F410 Panic disorder [episodic paroxysmal anxiety] without agoraphobia: Secondary | ICD-10-CM | POA: Diagnosis not present

## 2023-02-04 DIAGNOSIS — F4310 Post-traumatic stress disorder, unspecified: Secondary | ICD-10-CM | POA: Diagnosis not present

## 2023-02-04 MED ORDER — DULOXETINE HCL 20 MG PO CPEP
40.0000 mg | ORAL_CAPSULE | Freq: Every day | ORAL | 1 refills | Status: DC
  Filled 2023-02-04: qty 60, 30d supply, fill #0
  Filled 2023-02-27: qty 60, 30d supply, fill #1

## 2023-02-04 MED ORDER — GABAPENTIN 600 MG PO TABS
600.0000 mg | ORAL_TABLET | Freq: Two times a day (BID) | ORAL | 1 refills | Status: DC
  Filled 2023-02-04: qty 180, 90d supply, fill #0
  Filled 2023-05-17: qty 180, 90d supply, fill #1

## 2023-02-04 MED ORDER — AMLODIPINE BESYLATE 10 MG PO TABS
10.0000 mg | ORAL_TABLET | Freq: Every morning | ORAL | 0 refills | Status: DC
Start: 2023-02-04 — End: 2023-04-29
  Filled 2023-02-04 (×2): qty 90, 90d supply, fill #0

## 2023-02-05 ENCOUNTER — Other Ambulatory Visit (HOSPITAL_BASED_OUTPATIENT_CLINIC_OR_DEPARTMENT_OTHER): Payer: Self-pay

## 2023-02-12 ENCOUNTER — Other Ambulatory Visit: Payer: Self-pay | Admitting: Family Medicine

## 2023-02-12 DIAGNOSIS — K2970 Gastritis, unspecified, without bleeding: Secondary | ICD-10-CM

## 2023-02-12 DIAGNOSIS — R1310 Dysphagia, unspecified: Secondary | ICD-10-CM

## 2023-02-13 ENCOUNTER — Other Ambulatory Visit (HOSPITAL_BASED_OUTPATIENT_CLINIC_OR_DEPARTMENT_OTHER): Payer: Self-pay

## 2023-02-13 MED ORDER — PANTOPRAZOLE SODIUM 40 MG PO TBEC
40.0000 mg | DELAYED_RELEASE_TABLET | Freq: Two times a day (BID) | ORAL | 2 refills | Status: DC
  Filled 2023-02-13: qty 60, 30d supply, fill #0
  Filled 2023-03-18: qty 60, 30d supply, fill #1
  Filled 2023-04-17: qty 60, 30d supply, fill #2

## 2023-02-16 ENCOUNTER — Other Ambulatory Visit: Payer: Self-pay | Admitting: Family Medicine

## 2023-02-16 ENCOUNTER — Other Ambulatory Visit (HOSPITAL_BASED_OUTPATIENT_CLINIC_OR_DEPARTMENT_OTHER): Payer: Self-pay

## 2023-02-18 ENCOUNTER — Ambulatory Visit: Payer: BC Managed Care – PPO | Admitting: Family Medicine

## 2023-02-18 ENCOUNTER — Encounter: Payer: Self-pay | Admitting: Family Medicine

## 2023-02-18 VITALS — BP 117/70 | HR 89 | Ht 72.0 in | Wt 226.0 lb

## 2023-02-18 DIAGNOSIS — M6788 Other specified disorders of synovium and tendon, other site: Secondary | ICD-10-CM

## 2023-02-18 DIAGNOSIS — E119 Type 2 diabetes mellitus without complications: Secondary | ICD-10-CM | POA: Diagnosis not present

## 2023-02-18 DIAGNOSIS — I10 Essential (primary) hypertension: Secondary | ICD-10-CM | POA: Diagnosis not present

## 2023-02-18 DIAGNOSIS — E291 Testicular hypofunction: Secondary | ICD-10-CM | POA: Diagnosis not present

## 2023-02-18 LAB — POCT GLYCOSYLATED HEMOGLOBIN (HGB A1C): HbA1c, POC (controlled diabetic range): 5.5 % (ref 0.0–7.0)

## 2023-02-18 NOTE — Assessment & Plan Note (Signed)
Given handout for rehab exercises

## 2023-02-18 NOTE — Addendum Note (Signed)
Addended by: Ardyth Man on: 02/18/2023 10:31 AM   Modules accepted: Orders

## 2023-02-18 NOTE — Assessment & Plan Note (Signed)
BP is well controlled.  Continue current medications for management of HTN.   

## 2023-02-18 NOTE — Assessment & Plan Note (Signed)
Diabetes is well controlled.  Continue with Mounjaro, may titrate to 10mg /weekly.

## 2023-02-18 NOTE — Progress Notes (Signed)
Tyler Gross - 47 y.o. male MRN [id number]  Date of birth: 04/13/1976  Subjective Chief Complaint  Patient presents with   Diabetes   Hypertension   Ankle Pain    HPI Tyler Gross is a 47 y.o. male here today for follow up.   He has complaint of L ankle pain.  Started after dancing a couple of months ago.  On  and off nagging pain.  No swelling.  Has use compression brace with some improvement.   He has been seeing his wellness program at work and phentermine was added recently for appetite suppression.  He has tolerated this well and BP remains well controlled.   Continues on mounjaro for management of diabetes.  Tolerating well.  Has had some trouble finding this in stock.  Had to drop back to 7.5mg , he does have the 10mg  now.    He has been on clomid for hypogonadism.   His urologist is considering change to testosterone due to decreased libido.   ROS:  A comprehensive ROS was completed and negative except as noted per HPI    Allergies  Allergen Reactions   Atorvastatin Other (See Comments)    Muscle fatigue   Bee Venom Swelling   Sulfamethoxazole-Trimethoprim Hives    Bactrim     Past Medical History:  Diagnosis Date   Acne    on back taking doxycycline for   Alcoholism [facility])    sober since 04/2018   Anxiety    Depression    DM type 2 (diabetes mellitus, type 2) (HCC)    ED (erectile dysfunction)    GAD (generalized anxiety disorder)    GERD (gastroesophageal reflux disease)    Gynecomastia, male    followed by dr Bevelyn Ngo   High serum estradiol    resolved   History of anal fissures    History of kidney stones    History of small bowel obstruction 2004   Hypertension    IBS (irritable bowel syndrome)    Mixed hyperlipidemia    OSA on CPAP    per last study 07-31-2013  severe osa   Primary hypogonadism in male    endocrinologist-  dr Bevelyn Ngo    Past Surgical History:  Procedure Laterality Date   CARDIOVASCULAR STRESS TEST  10/05/2009    normal nuclear study w/ no ischemia/  normal LV function and wall motion , ef 71%   COLONOSCOPY  last one   2004, april 2021   EVALUATION UNDER ANESTHESIA WITH ANAL FISTULECTOMY N/A 02/13/2018   Procedure: ANAL EXAM UNDER ANESTHESIA WITH BIOPSY;  Surgeon: Romie Levee, MD;  Location: [facility] [place];  Service: General;  Laterality: N/A;   ORCHIECTOMY Left 1991   w/ placement prosthesis (for torsion)   TESTICULAR EXPLORATION Left 01/29/2020   Procedure: EXCHANGE OF LEFT TESTICULAR PROSTHESIS;  Surgeon: Malen Gauze, MD;  Location: [facility];  Service: Urology;  Laterality: Left;   UPPER GI ENDOSCOPY N/A 06/05/2021   Procedure: UPPER GI ENDOSCOPY;  Surgeon: Luretha Murphy, MD;  Location: [facility];  Service: General;  Laterality: N/A;   URETEROLITHOTOMY  1999    Social History   Socioeconomic History   Marital status: Single    Spouse name: Not on file   Number of children: Not on file   Years of education: Not on file   Highest education level: Not on file  Occupational History   Occupation: RN   Tobacco Use   Smoking status: Former  Current packs/day: 0.00    Average packs/day: 1 pack/day for 10.0 years (10.0 ttl pk-yrs)    Types: Cigarettes    Start date: 02/08/1999    Quit date: 02/07/2009    Years since quitting: 14.0   Smokeless tobacco: Never  Vaping Use   Vaping status: Never Used  Substance and Sexual Activity   Alcohol use: Not Currently   Drug use: No   Sexual activity: Yes    Partners: Male  Other Topics Concern   Not on file  Social History Narrative   Lives with partner   Regular exercise: no   Caffeine use: 1 large cup of coffee daily; 2 to 3 sodas in the evening   Social Determinants of Health   Financial Resource Strain: Not on file  Food Insecurity: Not on file  Transportation Needs: Not on file  Physical Activity: Not on file  Stress: Not on file  Social Connections: Not on file    Family History  Problem  Relation Age of Onset   Cancer Mother        breast cancer: stage I   Hypertension Mother    Irritable bowel syndrome Mother    Heart disease Maternal Grandfather    Diabetes Paternal Grandmother    Colon cancer Maternal Aunt        great aunt   Esophageal cancer Neg Hx    Rectal cancer Neg Hx    Stomach cancer Neg Hx     Health Maintenance  Topic Date Due   OPHTHALMOLOGY EXAM  04/19/2023 (Originally 06/28/2022)   Hepatitis C Screening  04/19/2023 (Originally 11/16/1992)   COVID-19 Vaccine (4 - 2023-24 season) 11/02/2023 (Originally 04/06/2022)   INFLUENZA VACCINE  03/07/2023   HEMOGLOBIN A1C  04/19/2023   Diabetic kidney evaluation - eGFR measurement  10/17/2023   Diabetic kidney evaluation - Urine ACR  10/17/2023   DTaP/Tdap/Td (2 - Td or Tdap) 01/27/2024   FOOT EXAM  02/18/2024   Colonoscopy  10/07/2029   HIV Screening  Completed   HPV VACCINES  Aged Out     ----------------------------------------------------------------------------------------------------------------------------------------------------------------------------------------------------------------- Physical Exam BP 117/70 (BP Location: Left Arm, Patient Position: Sitting, Cuff Size: Normal)   Pulse 89   Ht 6' (1.829 m)   Wt 226 lb (102.5 kg)   SpO2 97%   BMI 30.65 kg/m   Physical Exam Constitutional:      Appearance: Normal appearance.  HENT:     Head: Normocephalic and atraumatic.  Cardiovascular:     Rate and Rhythm: Normal rate and regular rhythm.  Pulmonary:     Effort: Pulmonary effort is normal.     Breath sounds: Normal breath sounds.  Musculoskeletal:     Cervical back: Neck supple.  Neurological:     Mental Status: He is alert.  Psychiatric:        Mood and Affect: Mood normal.        Behavior: Behavior normal.      ------------------------------------------------------------------------------------------------------------------------------------------------------------------------------------------------------------------- Assessment and Plan  Essential hypertension BP is well controlled.  Continue current medications for management of HTN.    Type 2 diabetes mellitus without complication, without long-term current use of insulin (HCC) Diabetes is well controlled.  Continue with Mounjaro, may titrate to 10mg /weekly.   Hypogonadism male Managed by urology, may be changing to testosterone from clomid soon.   Peroneal tendinosis Given handout for rehab exercises   No orders of the defined types were placed in this encounter.   Return in about 6 months (around 08/21/2023) for HTN/T2DM.  This visit occurred during the SARS-CoV-2 public health emergency.  Safety protocols were in place, including screening questions prior to the visit, additional usage of staff PPE, and extensive cleaning of exam room while observing appropriate contact time as indicated for disinfecting solutions.

## 2023-02-18 NOTE — Assessment & Plan Note (Signed)
Managed by urology, may be changing to testosterone from clomid soon.

## 2023-02-19 ENCOUNTER — Other Ambulatory Visit (HOSPITAL_BASED_OUTPATIENT_CLINIC_OR_DEPARTMENT_OTHER): Payer: Self-pay

## 2023-03-02 ENCOUNTER — Other Ambulatory Visit (HOSPITAL_BASED_OUTPATIENT_CLINIC_OR_DEPARTMENT_OTHER): Payer: Self-pay

## 2023-03-04 ENCOUNTER — Other Ambulatory Visit (HOSPITAL_BASED_OUTPATIENT_CLINIC_OR_DEPARTMENT_OTHER): Payer: Self-pay

## 2023-03-05 ENCOUNTER — Other Ambulatory Visit (HOSPITAL_BASED_OUTPATIENT_CLINIC_OR_DEPARTMENT_OTHER): Payer: Self-pay

## 2023-03-05 MED ORDER — PHENTERMINE HCL 30 MG PO CAPS
30.0000 mg | ORAL_CAPSULE | Freq: Every day | ORAL | 3 refills | Status: DC
  Filled 2023-03-05: qty 30, 30d supply, fill #0

## 2023-03-08 DIAGNOSIS — F4310 Post-traumatic stress disorder, unspecified: Secondary | ICD-10-CM | POA: Diagnosis not present

## 2023-03-08 DIAGNOSIS — F410 Panic disorder [episodic paroxysmal anxiety] without agoraphobia: Secondary | ICD-10-CM | POA: Diagnosis not present

## 2023-03-08 DIAGNOSIS — F339 Major depressive disorder, recurrent, unspecified: Secondary | ICD-10-CM | POA: Diagnosis not present

## 2023-03-08 DIAGNOSIS — F411 Generalized anxiety disorder: Secondary | ICD-10-CM | POA: Diagnosis not present

## 2023-03-09 ENCOUNTER — Other Ambulatory Visit: Payer: Self-pay | Admitting: Urology

## 2023-03-12 DIAGNOSIS — E6609 Other obesity due to excess calories: Secondary | ICD-10-CM | POA: Diagnosis not present

## 2023-03-12 DIAGNOSIS — F331 Major depressive disorder, recurrent, moderate: Secondary | ICD-10-CM | POA: Diagnosis not present

## 2023-03-12 DIAGNOSIS — F411 Generalized anxiety disorder: Secondary | ICD-10-CM | POA: Diagnosis not present

## 2023-03-12 DIAGNOSIS — F5081 Binge eating disorder: Secondary | ICD-10-CM | POA: Diagnosis not present

## 2023-03-15 ENCOUNTER — Other Ambulatory Visit (HOSPITAL_BASED_OUTPATIENT_CLINIC_OR_DEPARTMENT_OTHER): Payer: Self-pay

## 2023-03-16 ENCOUNTER — Other Ambulatory Visit: Payer: Self-pay | Admitting: Urology

## 2023-03-16 ENCOUNTER — Other Ambulatory Visit (HOSPITAL_BASED_OUTPATIENT_CLINIC_OR_DEPARTMENT_OTHER): Payer: Self-pay

## 2023-03-16 DIAGNOSIS — E291 Testicular hypofunction: Secondary | ICD-10-CM

## 2023-03-18 ENCOUNTER — Other Ambulatory Visit (HOSPITAL_BASED_OUTPATIENT_CLINIC_OR_DEPARTMENT_OTHER): Payer: Self-pay

## 2023-03-19 ENCOUNTER — Other Ambulatory Visit (HOSPITAL_BASED_OUTPATIENT_CLINIC_OR_DEPARTMENT_OTHER): Payer: Self-pay

## 2023-03-20 ENCOUNTER — Other Ambulatory Visit: Payer: Self-pay

## 2023-03-20 ENCOUNTER — Other Ambulatory Visit (HOSPITAL_BASED_OUTPATIENT_CLINIC_OR_DEPARTMENT_OTHER): Payer: Self-pay

## 2023-03-20 MED ORDER — CLOMIPHENE CITRATE 50 MG PO TABS
25.0000 mg | ORAL_TABLET | ORAL | 1 refills | Status: DC
  Filled 2023-03-20: qty 20, 90d supply, fill #0
  Filled 2023-03-23: qty 18, 84d supply, fill #0
  Filled 2023-03-25 – 2023-03-27 (×3): qty 30, fill #0
  Filled 2023-03-28 – 2023-03-29 (×2): qty 30, 140d supply, fill #0
  Filled 2023-03-30: qty 18, 84d supply, fill #0
  Filled 2023-04-01 – 2023-04-05 (×5): qty 30, 140d supply, fill #0
  Filled 2023-04-06: qty 18, 84d supply, fill #0
  Filled 2023-04-08: qty 30, 140d supply, fill #0

## 2023-03-20 MED ORDER — CLOMIPHENE CITRATE 50 MG PO TABS
25.0000 mg | ORAL_TABLET | ORAL | 5 refills | Status: DC
  Filled 2023-03-20: qty 14, 90d supply, fill #0
  Filled 2023-03-28: qty 14, 65d supply, fill #0
  Filled 2023-03-29 – 2023-04-02 (×4): qty 14, fill #0

## 2023-03-20 MED ORDER — ANASTROZOLE 1 MG PO TABS
0.5000 mg | ORAL_TABLET | ORAL | 3 refills | Status: DC
  Filled 2023-03-20 – 2023-06-04 (×5): qty 20, 90d supply, fill #0
  Filled 2023-09-06 – 2023-09-23 (×2): qty 20, 90d supply, fill #1

## 2023-03-21 ENCOUNTER — Other Ambulatory Visit (HOSPITAL_BASED_OUTPATIENT_CLINIC_OR_DEPARTMENT_OTHER): Payer: Self-pay

## 2023-03-21 MED ORDER — CLOMID 50 MG PO TABS
50.0000 mg | ORAL_TABLET | ORAL | 1 refills | Status: DC
  Filled 2023-03-21 – 2023-03-22 (×3): qty 36, 84d supply, fill #0
  Filled 2023-06-08 (×2): qty 36, 84d supply, fill #1

## 2023-03-21 MED ORDER — ANASTROZOLE 1 MG PO TABS
1.0000 mg | ORAL_TABLET | ORAL | 1 refills | Status: DC
  Filled 2023-03-21 – 2023-03-22 (×3): qty 36, 84d supply, fill #0
  Filled 2023-06-08 – 2023-07-09 (×6): qty 36, 84d supply, fill #1

## 2023-03-22 ENCOUNTER — Other Ambulatory Visit (HOSPITAL_BASED_OUTPATIENT_CLINIC_OR_DEPARTMENT_OTHER): Payer: Self-pay

## 2023-03-22 MED ORDER — LISDEXAMFETAMINE DIMESYLATE 30 MG PO CAPS
30.0000 mg | ORAL_CAPSULE | Freq: Every day | ORAL | 0 refills | Status: DC
  Filled 2023-03-22: qty 30, 30d supply, fill #0

## 2023-03-23 ENCOUNTER — Other Ambulatory Visit (HOSPITAL_BASED_OUTPATIENT_CLINIC_OR_DEPARTMENT_OTHER): Payer: Self-pay

## 2023-03-25 ENCOUNTER — Other Ambulatory Visit (HOSPITAL_BASED_OUTPATIENT_CLINIC_OR_DEPARTMENT_OTHER): Payer: Self-pay

## 2023-03-26 ENCOUNTER — Other Ambulatory Visit: Payer: Self-pay

## 2023-03-27 ENCOUNTER — Other Ambulatory Visit (HOSPITAL_BASED_OUTPATIENT_CLINIC_OR_DEPARTMENT_OTHER): Payer: Self-pay

## 2023-03-27 ENCOUNTER — Other Ambulatory Visit (HOSPITAL_COMMUNITY): Payer: Self-pay

## 2023-03-28 ENCOUNTER — Other Ambulatory Visit (HOSPITAL_BASED_OUTPATIENT_CLINIC_OR_DEPARTMENT_OTHER): Payer: Self-pay

## 2023-03-29 ENCOUNTER — Other Ambulatory Visit (HOSPITAL_BASED_OUTPATIENT_CLINIC_OR_DEPARTMENT_OTHER): Payer: Self-pay

## 2023-03-30 ENCOUNTER — Other Ambulatory Visit (HOSPITAL_BASED_OUTPATIENT_CLINIC_OR_DEPARTMENT_OTHER): Payer: Self-pay

## 2023-04-01 ENCOUNTER — Other Ambulatory Visit (HOSPITAL_BASED_OUTPATIENT_CLINIC_OR_DEPARTMENT_OTHER): Payer: Self-pay

## 2023-04-02 ENCOUNTER — Other Ambulatory Visit: Payer: Self-pay

## 2023-04-02 ENCOUNTER — Other Ambulatory Visit (HOSPITAL_BASED_OUTPATIENT_CLINIC_OR_DEPARTMENT_OTHER): Payer: Self-pay

## 2023-04-03 ENCOUNTER — Other Ambulatory Visit (HOSPITAL_BASED_OUTPATIENT_CLINIC_OR_DEPARTMENT_OTHER): Payer: Self-pay

## 2023-04-04 ENCOUNTER — Other Ambulatory Visit (HOSPITAL_BASED_OUTPATIENT_CLINIC_OR_DEPARTMENT_OTHER): Payer: Self-pay

## 2023-04-05 ENCOUNTER — Other Ambulatory Visit (HOSPITAL_BASED_OUTPATIENT_CLINIC_OR_DEPARTMENT_OTHER): Payer: Self-pay

## 2023-04-05 DIAGNOSIS — E6609 Other obesity due to excess calories: Secondary | ICD-10-CM | POA: Diagnosis not present

## 2023-04-05 DIAGNOSIS — F411 Generalized anxiety disorder: Secondary | ICD-10-CM | POA: Diagnosis not present

## 2023-04-05 DIAGNOSIS — F331 Major depressive disorder, recurrent, moderate: Secondary | ICD-10-CM | POA: Diagnosis not present

## 2023-04-05 DIAGNOSIS — F5081 Binge eating disorder: Secondary | ICD-10-CM | POA: Diagnosis not present

## 2023-04-05 MED ORDER — LISDEXAMFETAMINE DIMESYLATE 40 MG PO CAPS
40.0000 mg | ORAL_CAPSULE | Freq: Every morning | ORAL | 0 refills | Status: DC
  Filled 2023-04-05: qty 30, 30d supply, fill #0

## 2023-04-06 ENCOUNTER — Other Ambulatory Visit (HOSPITAL_BASED_OUTPATIENT_CLINIC_OR_DEPARTMENT_OTHER): Payer: Self-pay

## 2023-04-08 ENCOUNTER — Other Ambulatory Visit (HOSPITAL_BASED_OUTPATIENT_CLINIC_OR_DEPARTMENT_OTHER): Payer: Self-pay

## 2023-04-15 ENCOUNTER — Other Ambulatory Visit: Payer: Self-pay | Admitting: Family Medicine

## 2023-04-15 ENCOUNTER — Other Ambulatory Visit (HOSPITAL_BASED_OUTPATIENT_CLINIC_OR_DEPARTMENT_OTHER): Payer: Self-pay

## 2023-04-15 MED ORDER — POTASSIUM CHLORIDE CRYS ER 10 MEQ PO TBCR
10.0000 meq | EXTENDED_RELEASE_TABLET | Freq: Every day | ORAL | 1 refills | Status: DC
  Filled 2023-04-15 – 2023-06-22 (×6): qty 90, 90d supply, fill #0
  Filled 2023-09-19 – 2023-09-20 (×2): qty 90, 90d supply, fill #1

## 2023-04-17 ENCOUNTER — Other Ambulatory Visit (HOSPITAL_BASED_OUTPATIENT_CLINIC_OR_DEPARTMENT_OTHER): Payer: Self-pay

## 2023-04-19 ENCOUNTER — Other Ambulatory Visit (HOSPITAL_COMMUNITY): Payer: Self-pay

## 2023-04-22 ENCOUNTER — Other Ambulatory Visit (HOSPITAL_BASED_OUTPATIENT_CLINIC_OR_DEPARTMENT_OTHER): Payer: Self-pay

## 2023-04-24 ENCOUNTER — Other Ambulatory Visit: Payer: Self-pay

## 2023-04-24 ENCOUNTER — Other Ambulatory Visit (HOSPITAL_BASED_OUTPATIENT_CLINIC_OR_DEPARTMENT_OTHER): Payer: Self-pay

## 2023-04-24 DIAGNOSIS — E291 Testicular hypofunction: Secondary | ICD-10-CM | POA: Diagnosis not present

## 2023-04-25 ENCOUNTER — Other Ambulatory Visit (HOSPITAL_BASED_OUTPATIENT_CLINIC_OR_DEPARTMENT_OTHER): Payer: Self-pay

## 2023-04-26 ENCOUNTER — Other Ambulatory Visit (HOSPITAL_COMMUNITY): Payer: Self-pay

## 2023-04-26 ENCOUNTER — Other Ambulatory Visit (HOSPITAL_BASED_OUTPATIENT_CLINIC_OR_DEPARTMENT_OTHER): Payer: Self-pay

## 2023-04-27 ENCOUNTER — Other Ambulatory Visit (HOSPITAL_BASED_OUTPATIENT_CLINIC_OR_DEPARTMENT_OTHER): Payer: Self-pay

## 2023-04-29 ENCOUNTER — Other Ambulatory Visit (HOSPITAL_BASED_OUTPATIENT_CLINIC_OR_DEPARTMENT_OTHER): Payer: Self-pay

## 2023-04-29 ENCOUNTER — Other Ambulatory Visit: Payer: Self-pay | Admitting: Family Medicine

## 2023-04-29 DIAGNOSIS — I10 Essential (primary) hypertension: Secondary | ICD-10-CM

## 2023-04-29 MED ORDER — LISDEXAMFETAMINE DIMESYLATE 40 MG PO CAPS
40.0000 mg | ORAL_CAPSULE | Freq: Every morning | ORAL | 0 refills | Status: DC
  Filled 2023-04-29 – 2023-05-09 (×2): qty 30, 30d supply, fill #0

## 2023-04-30 ENCOUNTER — Other Ambulatory Visit (HOSPITAL_BASED_OUTPATIENT_CLINIC_OR_DEPARTMENT_OTHER): Payer: Self-pay

## 2023-04-30 MED ORDER — AMLODIPINE BESYLATE 10 MG PO TABS
10.0000 mg | ORAL_TABLET | Freq: Every morning | ORAL | 0 refills | Status: DC
Start: 2023-04-30 — End: 2023-07-28
  Filled 2023-04-30: qty 90, 90d supply, fill #0

## 2023-05-04 ENCOUNTER — Other Ambulatory Visit (HOSPITAL_BASED_OUTPATIENT_CLINIC_OR_DEPARTMENT_OTHER): Payer: Self-pay

## 2023-05-04 ENCOUNTER — Other Ambulatory Visit (HOSPITAL_COMMUNITY): Payer: Self-pay

## 2023-05-09 ENCOUNTER — Other Ambulatory Visit (HOSPITAL_BASED_OUTPATIENT_CLINIC_OR_DEPARTMENT_OTHER): Payer: Self-pay

## 2023-05-17 ENCOUNTER — Other Ambulatory Visit: Payer: Self-pay | Admitting: Family Medicine

## 2023-05-17 ENCOUNTER — Other Ambulatory Visit: Payer: Self-pay

## 2023-05-17 ENCOUNTER — Other Ambulatory Visit (HOSPITAL_BASED_OUTPATIENT_CLINIC_OR_DEPARTMENT_OTHER): Payer: Self-pay

## 2023-05-17 MED ORDER — ATENOLOL 100 MG PO TABS
100.0000 mg | ORAL_TABLET | ORAL | 1 refills | Status: DC
  Filled 2023-05-17: qty 90, 90d supply, fill #0
  Filled 2023-08-15: qty 90, 90d supply, fill #1

## 2023-05-20 ENCOUNTER — Other Ambulatory Visit: Payer: Self-pay | Admitting: Family Medicine

## 2023-05-20 ENCOUNTER — Other Ambulatory Visit: Payer: Self-pay

## 2023-05-20 ENCOUNTER — Other Ambulatory Visit (HOSPITAL_BASED_OUTPATIENT_CLINIC_OR_DEPARTMENT_OTHER): Payer: Self-pay

## 2023-05-20 DIAGNOSIS — K2970 Gastritis, unspecified, without bleeding: Secondary | ICD-10-CM

## 2023-05-20 DIAGNOSIS — R1310 Dysphagia, unspecified: Secondary | ICD-10-CM

## 2023-05-20 MED ORDER — PANTOPRAZOLE SODIUM 40 MG PO TBEC
40.0000 mg | DELAYED_RELEASE_TABLET | Freq: Two times a day (BID) | ORAL | 2 refills | Status: DC
Start: 2023-05-20 — End: 2023-08-20
  Filled 2023-05-20: qty 60, 30d supply, fill #0
  Filled 2023-06-18: qty 60, 30d supply, fill #1
  Filled 2023-07-22: qty 60, 30d supply, fill #2

## 2023-06-04 ENCOUNTER — Other Ambulatory Visit: Payer: Self-pay

## 2023-06-04 ENCOUNTER — Other Ambulatory Visit (HOSPITAL_BASED_OUTPATIENT_CLINIC_OR_DEPARTMENT_OTHER): Payer: Self-pay

## 2023-06-04 MED ORDER — LISDEXAMFETAMINE DIMESYLATE 40 MG PO CAPS
40.0000 mg | ORAL_CAPSULE | Freq: Every morning | ORAL | 0 refills | Status: AC
  Filled 2023-06-04: qty 30, 30d supply, fill #0

## 2023-06-08 ENCOUNTER — Other Ambulatory Visit (HOSPITAL_BASED_OUTPATIENT_CLINIC_OR_DEPARTMENT_OTHER): Payer: Self-pay

## 2023-06-10 ENCOUNTER — Other Ambulatory Visit (HOSPITAL_BASED_OUTPATIENT_CLINIC_OR_DEPARTMENT_OTHER): Payer: Self-pay

## 2023-06-12 ENCOUNTER — Other Ambulatory Visit (HOSPITAL_BASED_OUTPATIENT_CLINIC_OR_DEPARTMENT_OTHER): Payer: Self-pay

## 2023-06-12 MED ORDER — MOUNJARO 12.5 MG/0.5ML ~~LOC~~ SOAJ
12.5000 mg | SUBCUTANEOUS | 1 refills | Status: DC
  Filled 2023-06-12: qty 2, 28d supply, fill #0
  Filled 2023-07-04: qty 2, 28d supply, fill #1

## 2023-06-13 ENCOUNTER — Other Ambulatory Visit (HOSPITAL_BASED_OUTPATIENT_CLINIC_OR_DEPARTMENT_OTHER): Payer: Self-pay

## 2023-06-15 ENCOUNTER — Other Ambulatory Visit (HOSPITAL_BASED_OUTPATIENT_CLINIC_OR_DEPARTMENT_OTHER): Payer: Self-pay

## 2023-06-17 ENCOUNTER — Other Ambulatory Visit (HOSPITAL_BASED_OUTPATIENT_CLINIC_OR_DEPARTMENT_OTHER): Payer: Self-pay

## 2023-06-18 ENCOUNTER — Other Ambulatory Visit (HOSPITAL_BASED_OUTPATIENT_CLINIC_OR_DEPARTMENT_OTHER): Payer: Self-pay

## 2023-06-18 ENCOUNTER — Other Ambulatory Visit: Payer: Self-pay

## 2023-06-22 ENCOUNTER — Other Ambulatory Visit (HOSPITAL_BASED_OUTPATIENT_CLINIC_OR_DEPARTMENT_OTHER): Payer: Self-pay

## 2023-07-02 ENCOUNTER — Other Ambulatory Visit (HOSPITAL_BASED_OUTPATIENT_CLINIC_OR_DEPARTMENT_OTHER): Payer: Self-pay

## 2023-07-03 ENCOUNTER — Other Ambulatory Visit (HOSPITAL_BASED_OUTPATIENT_CLINIC_OR_DEPARTMENT_OTHER): Payer: Self-pay

## 2023-07-10 ENCOUNTER — Other Ambulatory Visit (HOSPITAL_BASED_OUTPATIENT_CLINIC_OR_DEPARTMENT_OTHER): Payer: Self-pay

## 2023-07-11 ENCOUNTER — Other Ambulatory Visit (HOSPITAL_BASED_OUTPATIENT_CLINIC_OR_DEPARTMENT_OTHER): Payer: Self-pay

## 2023-07-11 ENCOUNTER — Other Ambulatory Visit: Payer: Self-pay

## 2023-07-11 MED ORDER — LISDEXAMFETAMINE DIMESYLATE 40 MG PO CAPS
40.0000 mg | ORAL_CAPSULE | Freq: Every morning | ORAL | 0 refills | Status: DC
  Filled 2023-07-11: qty 30, 30d supply, fill #0

## 2023-07-19 ENCOUNTER — Other Ambulatory Visit (HOSPITAL_BASED_OUTPATIENT_CLINIC_OR_DEPARTMENT_OTHER): Payer: Self-pay

## 2023-07-22 ENCOUNTER — Other Ambulatory Visit (HOSPITAL_BASED_OUTPATIENT_CLINIC_OR_DEPARTMENT_OTHER): Payer: Self-pay

## 2023-07-25 DIAGNOSIS — N529 Male erectile dysfunction, unspecified: Secondary | ICD-10-CM | POA: Diagnosis not present

## 2023-07-25 DIAGNOSIS — E291 Testicular hypofunction: Secondary | ICD-10-CM | POA: Diagnosis not present

## 2023-07-25 DIAGNOSIS — E280 Estrogen excess: Secondary | ICD-10-CM | POA: Diagnosis not present

## 2023-07-28 ENCOUNTER — Other Ambulatory Visit: Payer: Self-pay | Admitting: Family Medicine

## 2023-07-28 DIAGNOSIS — I10 Essential (primary) hypertension: Secondary | ICD-10-CM

## 2023-07-29 ENCOUNTER — Other Ambulatory Visit (HOSPITAL_COMMUNITY): Payer: Self-pay

## 2023-07-29 ENCOUNTER — Other Ambulatory Visit: Payer: Self-pay

## 2023-07-29 MED ORDER — AMLODIPINE BESYLATE 10 MG PO TABS
10.0000 mg | ORAL_TABLET | Freq: Every morning | ORAL | 0 refills | Status: DC
  Filled 2023-07-29 – 2023-08-03 (×3): qty 90, 90d supply, fill #0

## 2023-08-03 ENCOUNTER — Other Ambulatory Visit (HOSPITAL_BASED_OUTPATIENT_CLINIC_OR_DEPARTMENT_OTHER): Payer: Self-pay

## 2023-08-03 ENCOUNTER — Other Ambulatory Visit (HOSPITAL_COMMUNITY): Payer: Self-pay

## 2023-08-05 ENCOUNTER — Other Ambulatory Visit (HOSPITAL_BASED_OUTPATIENT_CLINIC_OR_DEPARTMENT_OTHER): Payer: Self-pay

## 2023-08-05 MED ORDER — MOUNJARO 12.5 MG/0.5ML ~~LOC~~ SOAJ
12.5000 mg | SUBCUTANEOUS | 0 refills | Status: DC
  Filled 2023-08-05: qty 2, 28d supply, fill #0
  Filled 2023-08-29: qty 2, 28d supply, fill #1

## 2023-08-13 ENCOUNTER — Other Ambulatory Visit (HOSPITAL_BASED_OUTPATIENT_CLINIC_OR_DEPARTMENT_OTHER): Payer: Self-pay

## 2023-08-13 MED ORDER — LISDEXAMFETAMINE DIMESYLATE 40 MG PO CAPS
40.0000 mg | ORAL_CAPSULE | Freq: Every morning | ORAL | 0 refills | Status: AC
  Filled 2023-08-13: qty 30, 30d supply, fill #0

## 2023-08-13 MED ORDER — LISDEXAMFETAMINE DIMESYLATE 40 MG PO CAPS: 40.0000 mg | ORAL_CAPSULE | Freq: Every morning | ORAL | 0 refills | Status: AC

## 2023-08-14 ENCOUNTER — Other Ambulatory Visit (HOSPITAL_BASED_OUTPATIENT_CLINIC_OR_DEPARTMENT_OTHER): Payer: Self-pay

## 2023-08-15 ENCOUNTER — Other Ambulatory Visit: Payer: Self-pay

## 2023-08-20 ENCOUNTER — Other Ambulatory Visit (HOSPITAL_BASED_OUTPATIENT_CLINIC_OR_DEPARTMENT_OTHER): Payer: Self-pay

## 2023-08-20 ENCOUNTER — Other Ambulatory Visit: Payer: Self-pay | Admitting: Family Medicine

## 2023-08-20 ENCOUNTER — Other Ambulatory Visit: Payer: Self-pay

## 2023-08-20 DIAGNOSIS — R1310 Dysphagia, unspecified: Secondary | ICD-10-CM

## 2023-08-20 DIAGNOSIS — K2970 Gastritis, unspecified, without bleeding: Secondary | ICD-10-CM

## 2023-08-20 MED ORDER — PANTOPRAZOLE SODIUM 40 MG PO TBEC
40.0000 mg | DELAYED_RELEASE_TABLET | Freq: Two times a day (BID) | ORAL | 2 refills | Status: DC
  Filled 2023-08-20: qty 60, 30d supply, fill #0
  Filled 2023-09-08 – 2023-09-19 (×2): qty 60, 30d supply, fill #1
  Filled 2023-10-18: qty 60, 30d supply, fill #2

## 2023-08-21 ENCOUNTER — Other Ambulatory Visit (HOSPITAL_BASED_OUTPATIENT_CLINIC_OR_DEPARTMENT_OTHER): Payer: Self-pay

## 2023-08-21 ENCOUNTER — Encounter: Payer: Self-pay | Admitting: Family Medicine

## 2023-08-21 ENCOUNTER — Ambulatory Visit: Payer: BC Managed Care – PPO | Admitting: Family Medicine

## 2023-08-21 VITALS — BP 113/74 | HR 85 | Ht 72.0 in | Wt 205.5 lb

## 2023-08-21 DIAGNOSIS — E291 Testicular hypofunction: Secondary | ICD-10-CM

## 2023-08-21 DIAGNOSIS — E539 Vitamin B deficiency, unspecified: Secondary | ICD-10-CM

## 2023-08-21 DIAGNOSIS — E119 Type 2 diabetes mellitus without complications: Secondary | ICD-10-CM | POA: Diagnosis not present

## 2023-08-21 DIAGNOSIS — Z9884 Bariatric surgery status: Secondary | ICD-10-CM

## 2023-08-21 DIAGNOSIS — I10 Essential (primary) hypertension: Secondary | ICD-10-CM

## 2023-08-21 DIAGNOSIS — F32A Depression, unspecified: Secondary | ICD-10-CM

## 2023-08-21 MED ORDER — EPINEPHRINE 0.3 MG/0.3ML IJ SOAJ
0.3000 mg | INTRAMUSCULAR | 3 refills | Status: AC | PRN
  Filled 2023-08-21: qty 2, 2d supply, fill #0

## 2023-08-21 NOTE — Assessment & Plan Note (Signed)
 Managed by urology, continues on clomid  with Arimidex .

## 2023-08-21 NOTE — Assessment & Plan Note (Signed)
 Diabetes is well controlled.  Continue with Mounjaro , currently at 12.5mg /week. Encouraged to incorporate regular exercise.

## 2023-08-21 NOTE — Progress Notes (Signed)
 FAIN PINN - 48 y.o. male MRN [id number]  Date of birth: 13-Mar-1976  Subjective Chief Complaint  Patient presents with   Diabetes   Hypertension    HPI:  Tyler Gross is a 48 y.o. male here today for follow up visit.   He reports that he is doing well.  He is s/p laparascopic sleeve for weight loss.    He has been prescribed Mounjaro  for management of diabetes.  He is doing well with current strength.    BP is well controlled with amlodipine , lisinopril /hydrochlorothiazide  and atenolol .  No side effects with medication at current strength.  Denies chest pain, shortness of breath, palpitations, headache or vision changes.    Hypogonadism is managed by urology.  He remains on clomid  with arimidex  for treatment of this.  Doing well overall.   Mood is stable at this time.  He remains on duloxetine .  He is also on vyvanse .  These are being managed by psychiatry.    ROS:  A comprehensive ROS was completed and negative except as noted per HPI  Allergies  Allergen Reactions   Atorvastatin Other (See Comments)    Muscle fatigue   Bee Venom Swelling   Sulfamethoxazole-Trimethoprim Hives    Bactrim     Past Medical History:  Diagnosis Date   Acne    on back taking doxycycline  for   Alcoholism [facility])    sober since 04/2018   Anxiety    Depression    DM type 2 (diabetes mellitus, type 2) (HCC)    ED (erectile dysfunction)    GAD (generalized anxiety disorder)    GERD (gastroesophageal reflux disease)    Gynecomastia, male    followed by dr Timothy Ford   High serum estradiol     resolved   History of anal fissures    History of kidney stones    History of small bowel obstruction 2004   Hypertension    IBS (irritable bowel syndrome)    Mixed hyperlipidemia    OSA on CPAP    per last study 07-31-2013  severe osa   Primary hypogonadism in male    endocrinologist-  dr Timothy Ford    Past Surgical History:  Procedure Laterality Date   CARDIOVASCULAR STRESS TEST   10/05/2009   normal nuclear study w/ no ischemia/  normal LV function and wall motion , ef 71%   COLONOSCOPY  last one   2004, april 2021   EVALUATION UNDER ANESTHESIA WITH ANAL FISTULECTOMY N/A 02/13/2018   Procedure: ANAL EXAM UNDER ANESTHESIA WITH BIOPSY;  Surgeon: Joyce Nixon, MD;  Location: [facility];  Service: General;  Laterality: N/A;   ORCHIECTOMY Left 1991   w/ placement prosthesis (for torsion)   TESTICULAR EXPLORATION Left 01/29/2020   Procedure: EXCHANGE OF LEFT TESTICULAR PROSTHESIS;  Surgeon: Marco Severs, MD;  Location: [facility];  Service: Urology;  Laterality: Left;   UPPER GI ENDOSCOPY N/A 06/05/2021   Procedure: UPPER GI ENDOSCOPY;  Surgeon: Jacolyn Matar, MD;  Location: [facility];  Service: General;  Laterality: N/A;   URETEROLITHOTOMY  1999    Social History   Socioeconomic History   Marital status: Single    Spouse name: Not on file   Number of children: Not on file   Years of education: Not on file   Highest education level: Master's degree (e.g., MA, MS, MEng, MEd, MSW, MBA)  Occupational History   Occupation: RN   Tobacco Use   Smoking status: Former  Current packs/day: 0.00    Average packs/day: 1 pack/day for 10.0 years (10.0 ttl pk-yrs)    Types: Cigarettes    Start date: 02/08/1999    Quit date: 02/07/2009    Years since quitting: 14.5   Smokeless tobacco: Never  Vaping Use   Vaping status: Never Used  Substance and Sexual Activity   Alcohol use: Not Currently   Drug use: No   Sexual activity: Yes    Partners: Male  Other Topics Concern   Not on file  Social History Narrative   Lives with partner   Regular exercise: no   Caffeine use: 1 large cup of coffee daily; 2 to 3 sodas in the evening   Social Drivers of Health   Financial Resource Strain: Low Risk  (08/20/2023)   Overall Financial Resource Strain (CARDIA)    Difficulty of Paying Living Expenses: Not hard at all  Food Insecurity: No Food  Insecurity (08/20/2023)   Hunger Vital Sign    Worried About Running Out of Food in the Last Year: Never true    Ran Out of Food in the Last Year: Never true  Transportation Needs: No Transportation Needs (08/20/2023)   PRAPARE - Administrator, Civil Service (Medical): No    Lack of Transportation (Non-Medical): No  Physical Activity: Unknown (08/20/2023)   Exercise Vital Sign    Days of Exercise per Week: 0 days    Minutes of Exercise per Session: Not on file  Stress: No Stress Concern Present (08/20/2023)   [employer] of Occupational Health - Occupational Stress Questionnaire    Feeling of Stress : Only a little  Social Connections: Moderately Isolated (08/20/2023)   Social Connection and Isolation Panel [NHANES]    Frequency of Communication with Friends and Family: More than three times a week    Frequency of Social Gatherings with Friends and Family: More than three times a week    Attends Religious Services: Never    Database administrator or Organizations: No    Attends Engineer, structural: Not on file    Marital Status: Living with partner    Family History  Problem Relation Age of Onset   Cancer Mother        breast cancer: stage I   Hypertension Mother    Irritable bowel syndrome Mother    Heart disease Maternal Grandfather    Diabetes Paternal Grandmother    Colon cancer Maternal Aunt        great aunt   Esophageal cancer Neg Hx    Rectal cancer Neg Hx    Stomach cancer Neg Hx     Health Maintenance  Topic Date Due   Hepatitis C Screening  Never done   Pneumococcal Vaccine 78-48 Years old (2 of 2 - PCV) 02/17/2020   OPHTHALMOLOGY EXAM  06/28/2022   INFLUENZA VACCINE  03/07/2023   COVID-19 Vaccine (4 - 2024-25 season) 04/07/2023   HEMOGLOBIN A1C  08/21/2023   Diabetic kidney evaluation - eGFR measurement  10/17/2023   Diabetic kidney evaluation - Urine ACR  10/17/2023   DTaP/Tdap/Td (2 - Td or Tdap) 01/27/2024   FOOT EXAM   02/18/2024   Colonoscopy  10/07/2029   HIV Screening  Completed   HPV VACCINES  Aged Out     ----------------------------------------------------------------------------------------------------------------------------------------------------------------------------------------------------------------- Physical Exam BP 113/74 (BP Location: Left Arm, Patient Position: Sitting, Cuff Size: Large)   Pulse 85   Ht 6' (1.829 m)   Wt 205 lb 8 oz (93.2  kg)   SpO2 100%   BMI 27.87 kg/m   Physical Exam Constitutional:      Appearance: Normal appearance.  Eyes:     General: No scleral icterus. Cardiovascular:     Rate and Rhythm: Normal rate and regular rhythm.  Pulmonary:     Effort: Pulmonary effort is normal.     Breath sounds: Normal breath sounds.  Musculoskeletal:     Cervical back: Neck supple.  Neurological:     Mental Status: He is alert.  Psychiatric:        Mood and Affect: Mood normal.        Behavior: Behavior normal.     ------------------------------------------------------------------------------------------------------------------------------------------------------------------------------------------------------------------- Assessment and Plan  Type 2 diabetes mellitus without complication, without long-term current use of insulin  (HCC) Diabetes is well controlled.  Continue with Mounjaro , currently at 12.5mg /week. Encouraged to incorporate regular exercise.   Hypogonadism male Managed by urology, continues on clomid  with Arimidex .   Depressive disorder Stable at this time.  Management per [facility].   Essential hypertension BP is well controlled.  Continue current medications for management of HTN.     Meds ordered this encounter  Medications   EPINEPHrine  (EPIPEN  2-PAK) 0.3 mg/0.3 mL IJ SOAJ injection    Sig: Inject 0.3 mg into the muscle as needed for anaphylaxis.    Dispense:  2 each    Refill:  3    Return in about 6 months (around  02/18/2024) for Hypertension, Type 2 Diabetes.    This visit occurred during the SARS-CoV-2 public health emergency.  Safety protocols were in place, including screening questions prior to the visit, additional usage of staff PPE, and extensive cleaning of exam room while observing appropriate contact time as indicated for disinfecting solutions.

## 2023-08-21 NOTE — Assessment & Plan Note (Signed)
BP is well controlled.  Continue current medications for management of HTN.   

## 2023-08-21 NOTE — Assessment & Plan Note (Signed)
Stable at this time.  [facility].

## 2023-08-22 ENCOUNTER — Other Ambulatory Visit (HOSPITAL_BASED_OUTPATIENT_CLINIC_OR_DEPARTMENT_OTHER): Payer: Self-pay

## 2023-08-23 LAB — CBC WITH DIFFERENTIAL/PLATELET
Basophils Absolute: 0 10*3/uL (ref 0.0–0.2)
Basos: 0 %
EOS (ABSOLUTE): 0.2 10*3/uL (ref 0.0–0.4)
Eos: 3 %
Hematocrit: 45.9 % (ref 37.5–51.0)
Hemoglobin: 15 g/dL (ref 13.0–17.7)
Immature Grans (Abs): 0 10*3/uL (ref 0.0–0.1)
Immature Granulocytes: 0 %
Lymphocytes Absolute: 2.2 10*3/uL (ref 0.7–3.1)
Lymphs: 29 %
MCH: 29.4 pg (ref 26.6–33.0)
MCHC: 32.7 g/dL (ref 31.5–35.7)
MCV: 90 fL (ref 79–97)
Monocytes Absolute: 0.6 10*3/uL (ref 0.1–0.9)
Monocytes: 8 %
Neutrophils Absolute: 4.4 10*3/uL (ref 1.4–7.0)
Neutrophils: 60 %
Platelets: 242 10*3/uL (ref 150–450)
RBC: 5.11 x10E6/uL (ref 4.14–5.80)
RDW: 13 % (ref 11.6–15.4)
WBC: 7.4 10*3/uL (ref 3.4–10.8)

## 2023-08-23 LAB — CMP14+EGFR
ALT: 22 [IU]/L (ref 0–44)
AST: 22 [IU]/L (ref 0–40)
Albumin: 4.7 g/dL (ref 4.1–5.1)
Alkaline Phosphatase: 60 [IU]/L (ref 44–121)
BUN/Creatinine Ratio: 12 (ref 9–20)
BUN: 12 mg/dL (ref 6–24)
Bilirubin Total: 0.6 mg/dL (ref 0.0–1.2)
CO2: 23 mmol/L (ref 20–29)
Calcium: 9.7 mg/dL (ref 8.7–10.2)
Chloride: 106 mmol/L (ref 96–106)
Creatinine, Ser: 1.04 mg/dL (ref 0.76–1.27)
Globulin, Total: 1.9 g/dL (ref 1.5–4.5)
Glucose: 91 mg/dL (ref 70–99)
Potassium: 4.6 mmol/L (ref 3.5–5.2)
Sodium: 146 mmol/L — ABNORMAL HIGH (ref 134–144)
Total Protein: 6.6 g/dL (ref 6.0–8.5)
eGFR: 89 mL/min/{1.73_m2} (ref >59)

## 2023-08-23 LAB — MICROALBUMIN / CREATININE URINE RATIO
Creatinine, Urine: 192.8 mg/dL
Microalb/Creat Ratio: 38 mg/g{creat} — ABNORMAL HIGH (ref 0–29)
Microalbumin, Urine: 74.1 ug/mL

## 2023-08-23 LAB — VITAMIN B12: Vitamin B-12: 441 pg/mL (ref 232–1245)

## 2023-08-23 LAB — LIPID PANEL WITH LDL/HDL RATIO
Cholesterol, Total: 144 mg/dL (ref 100–199)
HDL: 55 mg/dL (ref >39)
LDL Chol Calc (NIH): 71 mg/dL (ref 0–99)
LDL/HDL Ratio: 1.3 {ratio} (ref 0.0–3.6)
Triglycerides: 96 mg/dL (ref 0–149)
VLDL Cholesterol Cal: 18 mg/dL (ref 5–40)

## 2023-08-23 LAB — VITAMIN D 25 HYDROXY (VIT D DEFICIENCY, FRACTURES): Vit D, 25-Hydroxy: 70.6 ng/mL (ref 30.0–100.0)

## 2023-08-23 LAB — HEMOGLOBIN A1C
Est. average glucose Bld gHb Est-mCnc: 105 mg/dL
Hgb A1c MFr Bld: 5.3 % (ref 4.8–5.6)

## 2023-08-26 ENCOUNTER — Other Ambulatory Visit (HOSPITAL_BASED_OUTPATIENT_CLINIC_OR_DEPARTMENT_OTHER): Payer: Self-pay

## 2023-08-26 ENCOUNTER — Encounter (HOSPITAL_BASED_OUTPATIENT_CLINIC_OR_DEPARTMENT_OTHER): Payer: Self-pay

## 2023-08-27 ENCOUNTER — Other Ambulatory Visit (HOSPITAL_BASED_OUTPATIENT_CLINIC_OR_DEPARTMENT_OTHER): Payer: Self-pay

## 2023-08-29 ENCOUNTER — Other Ambulatory Visit (HOSPITAL_BASED_OUTPATIENT_CLINIC_OR_DEPARTMENT_OTHER): Payer: Self-pay

## 2023-08-30 ENCOUNTER — Encounter: Payer: Self-pay | Admitting: Family Medicine

## 2023-09-03 ENCOUNTER — Other Ambulatory Visit: Payer: Self-pay

## 2023-09-03 ENCOUNTER — Other Ambulatory Visit (HOSPITAL_BASED_OUTPATIENT_CLINIC_OR_DEPARTMENT_OTHER): Payer: Self-pay

## 2023-09-03 MED ORDER — FLUVOXAMINE MALEATE 25 MG PO TABS
25.0000 mg | ORAL_TABLET | Freq: Every day | ORAL | 0 refills | Status: DC
  Filled 2023-09-03: qty 30, 30d supply, fill #0

## 2023-09-06 ENCOUNTER — Other Ambulatory Visit: Payer: Self-pay | Admitting: Urology

## 2023-09-06 ENCOUNTER — Other Ambulatory Visit (HOSPITAL_BASED_OUTPATIENT_CLINIC_OR_DEPARTMENT_OTHER): Payer: Self-pay

## 2023-09-06 DIAGNOSIS — E291 Testicular hypofunction: Secondary | ICD-10-CM

## 2023-09-08 ENCOUNTER — Other Ambulatory Visit (HOSPITAL_BASED_OUTPATIENT_CLINIC_OR_DEPARTMENT_OTHER): Payer: Self-pay

## 2023-09-10 ENCOUNTER — Other Ambulatory Visit (HOSPITAL_BASED_OUTPATIENT_CLINIC_OR_DEPARTMENT_OTHER): Payer: Self-pay

## 2023-09-10 MED ORDER — CLOMIPHENE CITRATE 50 MG PO TABS
25.0000 mg | ORAL_TABLET | ORAL | 3 refills | Status: DC
  Filled 2023-09-10: qty 18, 84d supply, fill #0

## 2023-09-11 ENCOUNTER — Other Ambulatory Visit (HOSPITAL_BASED_OUTPATIENT_CLINIC_OR_DEPARTMENT_OTHER): Payer: Self-pay

## 2023-09-11 MED ORDER — LISDEXAMFETAMINE DIMESYLATE 40 MG PO CAPS
40.0000 mg | ORAL_CAPSULE | Freq: Every morning | ORAL | 0 refills | Status: AC
  Filled 2023-09-11: qty 30, 30d supply, fill #0

## 2023-09-14 ENCOUNTER — Other Ambulatory Visit (HOSPITAL_BASED_OUTPATIENT_CLINIC_OR_DEPARTMENT_OTHER): Payer: Self-pay

## 2023-09-16 ENCOUNTER — Other Ambulatory Visit (HOSPITAL_BASED_OUTPATIENT_CLINIC_OR_DEPARTMENT_OTHER): Payer: Self-pay

## 2023-09-16 MED ORDER — GABAPENTIN 600 MG PO TABS
600.0000 mg | ORAL_TABLET | Freq: Two times a day (BID) | ORAL | 2 refills | Status: DC
  Filled 2023-09-16: qty 180, 90d supply, fill #0
  Filled 2023-12-12: qty 180, 90d supply, fill #1
  Filled 2024-03-11: qty 180, 90d supply, fill #2

## 2023-09-19 ENCOUNTER — Other Ambulatory Visit: Payer: Self-pay

## 2023-09-19 ENCOUNTER — Other Ambulatory Visit (HOSPITAL_BASED_OUTPATIENT_CLINIC_OR_DEPARTMENT_OTHER): Payer: Self-pay

## 2023-09-21 ENCOUNTER — Other Ambulatory Visit (HOSPITAL_BASED_OUTPATIENT_CLINIC_OR_DEPARTMENT_OTHER): Payer: Self-pay

## 2023-09-23 ENCOUNTER — Other Ambulatory Visit (HOSPITAL_BASED_OUTPATIENT_CLINIC_OR_DEPARTMENT_OTHER): Payer: Self-pay

## 2023-09-23 ENCOUNTER — Other Ambulatory Visit: Payer: Self-pay | Admitting: Family Medicine

## 2023-09-23 MED ORDER — MOUNJARO 12.5 MG/0.5ML ~~LOC~~ SOAJ
12.5000 mg | SUBCUTANEOUS | 2 refills | Status: DC
  Filled 2023-09-23 – 2023-09-24 (×3): qty 2, 28d supply, fill #0
  Filled 2023-10-16: qty 2, 28d supply, fill #1
  Filled 2023-11-16 – 2024-01-09 (×6): qty 2, 28d supply, fill #2

## 2023-09-24 ENCOUNTER — Other Ambulatory Visit (HOSPITAL_BASED_OUTPATIENT_CLINIC_OR_DEPARTMENT_OTHER): Payer: Self-pay

## 2023-09-24 ENCOUNTER — Other Ambulatory Visit: Payer: Self-pay

## 2023-09-24 MED ORDER — ROSUVASTATIN CALCIUM 10 MG PO TABS
10.0000 mg | ORAL_TABLET | Freq: Every day | ORAL | 2 refills | Status: DC
  Filled 2023-09-24: qty 90, 90d supply, fill #0
  Filled 2024-01-04: qty 90, 90d supply, fill #1
  Filled 2024-04-05: qty 90, 90d supply, fill #2

## 2023-09-27 ENCOUNTER — Other Ambulatory Visit (HOSPITAL_BASED_OUTPATIENT_CLINIC_OR_DEPARTMENT_OTHER): Payer: Self-pay

## 2023-09-27 ENCOUNTER — Other Ambulatory Visit (HOSPITAL_COMMUNITY): Payer: Self-pay

## 2023-09-27 MED ORDER — CYCLOBENZAPRINE HCL 7.5 MG PO TABS
7.5000 mg | ORAL_TABLET | Freq: Two times a day (BID) | ORAL | 0 refills | Status: AC | PRN
Start: 2023-09-27 — End: ?
  Filled 2023-09-27: qty 30, 8d supply, fill #0

## 2023-09-27 MED ORDER — CYCLOBENZAPRINE HCL 5 MG PO TABS
5.0000 mg | ORAL_TABLET | Freq: Two times a day (BID) | ORAL | 0 refills | Status: AC | PRN
  Filled 2023-09-27: qty 40, 10d supply, fill #0

## 2023-09-30 DIAGNOSIS — F322 Major depressive disorder, single episode, severe without psychotic features: Secondary | ICD-10-CM | POA: Diagnosis not present

## 2023-10-01 ENCOUNTER — Encounter: Payer: Self-pay | Admitting: Family Medicine

## 2023-10-01 DIAGNOSIS — F332 Major depressive disorder, recurrent severe without psychotic features: Secondary | ICD-10-CM | POA: Diagnosis not present

## 2023-10-02 ENCOUNTER — Other Ambulatory Visit (HOSPITAL_BASED_OUTPATIENT_CLINIC_OR_DEPARTMENT_OTHER): Payer: Self-pay

## 2023-10-02 DIAGNOSIS — F332 Major depressive disorder, recurrent severe without psychotic features: Secondary | ICD-10-CM | POA: Diagnosis not present

## 2023-10-02 MED ORDER — CLOMIPHENE CITRATE 50 MG PO TABS
50.0000 mg | ORAL_TABLET | ORAL | 1 refills | Status: DC
  Filled 2023-10-02 – 2023-10-04 (×3): qty 36, 84d supply, fill #0

## 2023-10-03 ENCOUNTER — Other Ambulatory Visit (HOSPITAL_BASED_OUTPATIENT_CLINIC_OR_DEPARTMENT_OTHER): Payer: Self-pay

## 2023-10-03 DIAGNOSIS — F332 Major depressive disorder, recurrent severe without psychotic features: Secondary | ICD-10-CM | POA: Diagnosis not present

## 2023-10-04 ENCOUNTER — Other Ambulatory Visit (HOSPITAL_BASED_OUTPATIENT_CLINIC_OR_DEPARTMENT_OTHER): Payer: Self-pay

## 2023-10-04 ENCOUNTER — Encounter (HOSPITAL_BASED_OUTPATIENT_CLINIC_OR_DEPARTMENT_OTHER): Payer: Self-pay

## 2023-10-08 DIAGNOSIS — F332 Major depressive disorder, recurrent severe without psychotic features: Secondary | ICD-10-CM | POA: Diagnosis not present

## 2023-10-09 DIAGNOSIS — F332 Major depressive disorder, recurrent severe without psychotic features: Secondary | ICD-10-CM | POA: Diagnosis not present

## 2023-10-10 DIAGNOSIS — F332 Major depressive disorder, recurrent severe without psychotic features: Secondary | ICD-10-CM | POA: Diagnosis not present

## 2023-10-11 ENCOUNTER — Other Ambulatory Visit: Payer: Self-pay

## 2023-10-11 ENCOUNTER — Telehealth: Payer: Self-pay

## 2023-10-11 ENCOUNTER — Other Ambulatory Visit (HOSPITAL_BASED_OUTPATIENT_CLINIC_OR_DEPARTMENT_OTHER): Payer: Self-pay

## 2023-10-11 DIAGNOSIS — F332 Major depressive disorder, recurrent severe without psychotic features: Secondary | ICD-10-CM | POA: Diagnosis not present

## 2023-10-11 MED ORDER — AMBULATORY NON FORMULARY MEDICATION: 0 refills | Status: DC

## 2023-10-11 MED ORDER — LISDEXAMFETAMINE DIMESYLATE 40 MG PO CAPS
40.0000 mg | ORAL_CAPSULE | Freq: Every morning | ORAL | 0 refills | Status: DC
  Filled 2024-01-06: qty 30, 30d supply, fill #0

## 2023-10-11 MED ORDER — DOXYCYCLINE MONOHYDRATE 100 MG PO CAPS
100.0000 mg | ORAL_CAPSULE | Freq: Two times a day (BID) | ORAL | 0 refills | Status: DC
  Filled 2023-10-11: qty 60, 30d supply, fill #0

## 2023-10-11 MED ORDER — LISDEXAMFETAMINE DIMESYLATE 40 MG PO CAPS
40.0000 mg | ORAL_CAPSULE | Freq: Every morning | ORAL | 0 refills | Status: AC
Start: 2023-10-11 — End: ?
  Filled 2023-10-11: qty 30, 30d supply, fill #0

## 2023-10-11 MED ORDER — CYANOCOBALAMIN 1000 MCG/ML IJ SOLN
1000.0000 ug | INTRAMUSCULAR | 1 refills | Status: DC
  Filled 2023-10-11: qty 12, 84d supply, fill #0
  Filled 2023-12-27: qty 12, 84d supply, fill #1
  Filled 2024-03-20: qty 2, 14d supply, fill #2

## 2023-10-11 NOTE — Telephone Encounter (Signed)
 CPAP order has been faxed with Demographics, insurance, and sleep study.

## 2023-10-14 ENCOUNTER — Other Ambulatory Visit (HOSPITAL_BASED_OUTPATIENT_CLINIC_OR_DEPARTMENT_OTHER): Payer: Self-pay

## 2023-10-14 DIAGNOSIS — F332 Major depressive disorder, recurrent severe without psychotic features: Secondary | ICD-10-CM | POA: Diagnosis not present

## 2023-10-14 MED ORDER — TADALAFIL 20 MG PO TABS
20.0000 mg | ORAL_TABLET | Freq: Every day | ORAL | 3 refills | Status: AC | PRN
  Filled 2023-10-14: qty 4, 30d supply, fill #0
  Filled 2024-01-04: qty 20, 20d supply, fill #1
  Filled 2024-04-05: qty 20, 20d supply, fill #2

## 2023-10-14 MED ORDER — CLOMIPHENE CITRATE 50 MG PO TABS
50.0000 mg | ORAL_TABLET | ORAL | 1 refills | Status: DC
  Filled 2023-10-14: qty 36, 84d supply, fill #0
  Filled 2024-01-12: qty 36, 84d supply, fill #1

## 2023-10-14 MED ORDER — ANASTROZOLE 1 MG PO TABS
1.0000 mg | ORAL_TABLET | ORAL | 1 refills | Status: AC
  Filled 2023-10-14 – 2023-10-22 (×6): qty 36, 84d supply, fill #0
  Filled 2023-10-23 – 2023-10-24 (×2): qty 36, fill #0
  Filled 2023-10-25: qty 36, 84d supply, fill #0
  Filled 2023-10-26 – 2023-10-28 (×2): qty 36, 90d supply, fill #0
  Filled 2023-10-29: qty 36, fill #0
  Filled 2023-10-30: qty 36, 84d supply, fill #0
  Filled 2024-01-15: qty 36, 84d supply, fill #1

## 2023-10-15 ENCOUNTER — Other Ambulatory Visit (HOSPITAL_BASED_OUTPATIENT_CLINIC_OR_DEPARTMENT_OTHER): Payer: Self-pay

## 2023-10-15 DIAGNOSIS — F332 Major depressive disorder, recurrent severe without psychotic features: Secondary | ICD-10-CM | POA: Diagnosis not present

## 2023-10-16 ENCOUNTER — Other Ambulatory Visit (HOSPITAL_BASED_OUTPATIENT_CLINIC_OR_DEPARTMENT_OTHER): Payer: Self-pay

## 2023-10-16 DIAGNOSIS — F332 Major depressive disorder, recurrent severe without psychotic features: Secondary | ICD-10-CM | POA: Diagnosis not present

## 2023-10-17 DIAGNOSIS — F332 Major depressive disorder, recurrent severe without psychotic features: Secondary | ICD-10-CM | POA: Diagnosis not present

## 2023-10-18 DIAGNOSIS — F332 Major depressive disorder, recurrent severe without psychotic features: Secondary | ICD-10-CM | POA: Diagnosis not present

## 2023-10-21 ENCOUNTER — Other Ambulatory Visit (HOSPITAL_BASED_OUTPATIENT_CLINIC_OR_DEPARTMENT_OTHER): Payer: Self-pay

## 2023-10-21 ENCOUNTER — Other Ambulatory Visit: Payer: Self-pay | Admitting: Family Medicine

## 2023-10-21 DIAGNOSIS — F332 Major depressive disorder, recurrent severe without psychotic features: Secondary | ICD-10-CM | POA: Diagnosis not present

## 2023-10-22 ENCOUNTER — Other Ambulatory Visit: Payer: Self-pay

## 2023-10-22 ENCOUNTER — Other Ambulatory Visit (HOSPITAL_BASED_OUTPATIENT_CLINIC_OR_DEPARTMENT_OTHER): Payer: Self-pay

## 2023-10-22 DIAGNOSIS — F332 Major depressive disorder, recurrent severe without psychotic features: Secondary | ICD-10-CM | POA: Diagnosis not present

## 2023-10-23 ENCOUNTER — Other Ambulatory Visit (HOSPITAL_BASED_OUTPATIENT_CLINIC_OR_DEPARTMENT_OTHER): Payer: Self-pay

## 2023-10-23 DIAGNOSIS — F332 Major depressive disorder, recurrent severe without psychotic features: Secondary | ICD-10-CM | POA: Diagnosis not present

## 2023-10-23 MED ORDER — LISINOPRIL-HYDROCHLOROTHIAZIDE 20-25 MG PO TABS
1.0000 | ORAL_TABLET | Freq: Every day | ORAL | 1 refills | Status: DC
  Filled 2023-10-26: qty 90, 90d supply, fill #0
  Filled 2024-01-23: qty 90, 90d supply, fill #1

## 2023-10-24 ENCOUNTER — Other Ambulatory Visit (HOSPITAL_BASED_OUTPATIENT_CLINIC_OR_DEPARTMENT_OTHER): Payer: Self-pay

## 2023-10-24 DIAGNOSIS — F332 Major depressive disorder, recurrent severe without psychotic features: Secondary | ICD-10-CM | POA: Diagnosis not present

## 2023-10-25 ENCOUNTER — Other Ambulatory Visit (HOSPITAL_BASED_OUTPATIENT_CLINIC_OR_DEPARTMENT_OTHER): Payer: Self-pay

## 2023-10-25 ENCOUNTER — Other Ambulatory Visit: Payer: Self-pay

## 2023-10-26 ENCOUNTER — Other Ambulatory Visit: Payer: Self-pay | Admitting: Family Medicine

## 2023-10-26 ENCOUNTER — Other Ambulatory Visit (HOSPITAL_BASED_OUTPATIENT_CLINIC_OR_DEPARTMENT_OTHER): Payer: Self-pay

## 2023-10-26 DIAGNOSIS — I10 Essential (primary) hypertension: Secondary | ICD-10-CM

## 2023-10-27 ENCOUNTER — Other Ambulatory Visit (HOSPITAL_BASED_OUTPATIENT_CLINIC_OR_DEPARTMENT_OTHER): Payer: Self-pay

## 2023-10-28 ENCOUNTER — Other Ambulatory Visit (HOSPITAL_BASED_OUTPATIENT_CLINIC_OR_DEPARTMENT_OTHER): Payer: Self-pay

## 2023-10-28 ENCOUNTER — Other Ambulatory Visit: Payer: Self-pay

## 2023-10-28 DIAGNOSIS — F332 Major depressive disorder, recurrent severe without psychotic features: Secondary | ICD-10-CM | POA: Diagnosis not present

## 2023-10-29 ENCOUNTER — Other Ambulatory Visit (HOSPITAL_BASED_OUTPATIENT_CLINIC_OR_DEPARTMENT_OTHER): Payer: Self-pay

## 2023-10-29 DIAGNOSIS — F332 Major depressive disorder, recurrent severe without psychotic features: Secondary | ICD-10-CM | POA: Diagnosis not present

## 2023-10-30 ENCOUNTER — Other Ambulatory Visit: Payer: Self-pay

## 2023-10-30 ENCOUNTER — Other Ambulatory Visit (HOSPITAL_BASED_OUTPATIENT_CLINIC_OR_DEPARTMENT_OTHER): Payer: Self-pay

## 2023-10-30 DIAGNOSIS — F332 Major depressive disorder, recurrent severe without psychotic features: Secondary | ICD-10-CM | POA: Diagnosis not present

## 2023-10-30 MED ORDER — AMLODIPINE BESYLATE 10 MG PO TABS
10.0000 mg | ORAL_TABLET | Freq: Every morning | ORAL | 0 refills | Status: DC
  Filled 2023-10-30: qty 90, 90d supply, fill #0

## 2023-10-31 ENCOUNTER — Ambulatory Visit: Admitting: Family Medicine

## 2023-10-31 ENCOUNTER — Encounter: Payer: Self-pay | Admitting: Family Medicine

## 2023-10-31 ENCOUNTER — Other Ambulatory Visit (HOSPITAL_BASED_OUTPATIENT_CLINIC_OR_DEPARTMENT_OTHER): Payer: Self-pay

## 2023-10-31 VITALS — BP 102/70 | HR 84 | Ht 72.0 in | Wt 195.0 lb

## 2023-10-31 DIAGNOSIS — G4733 Obstructive sleep apnea (adult) (pediatric): Secondary | ICD-10-CM

## 2023-10-31 DIAGNOSIS — L309 Dermatitis, unspecified: Secondary | ICD-10-CM

## 2023-10-31 DIAGNOSIS — F332 Major depressive disorder, recurrent severe without psychotic features: Secondary | ICD-10-CM | POA: Diagnosis not present

## 2023-10-31 MED ORDER — TRIAMCINOLONE ACETONIDE 0.1 % EX CREA
1.0000 | TOPICAL_CREAM | Freq: Two times a day (BID) | CUTANEOUS | 0 refills | Status: AC
  Filled 2023-10-31: qty 30, 60d supply, fill #0

## 2023-10-31 NOTE — Progress Notes (Signed)
 Tyler Gross - 48 y.o. male MRN [id number]  Date of birth: May 31, 1976  Subjective Chief Complaint  Patient presents with   Sleep Apnea    HPI Tyler Gross is a 48 year old male here today for follow-up.  Has history of sleep apnea but has been treated with CPAP for nearly 20 years.  He has used CPAP consistently.  I did review his compliance report today.  He has tolerating CPAP very well.  No adverse effects from this.  He does benefit from this with improved sleep, reduction of AHI and decreased fatigue.  He does have dry, scaly erythematous rash on bilateral lower extremities.  He is using moisturizer on this area but this has not resolved.  Area is itchy.  ROS:  A comprehensive ROS was completed and negative except as noted per HPI  Allergies  Allergen Reactions   Atorvastatin Other (See Comments)    Muscle fatigue   Bee Venom Swelling   Sulfamethoxazole-Trimethoprim Hives    Bactrim     Past Medical History:  Diagnosis Date   Acne    on back taking doxycycline for   Alcoholism [facility])    sober since 04/2018   Anxiety    Depression    DM type 2 (diabetes mellitus, type 2) (HCC)    ED (erectile dysfunction)    GAD (generalized anxiety disorder)    GERD (gastroesophageal reflux disease)    Gynecomastia, male    followed by dr Bevelyn Ngo   High serum estradiol    resolved   History of anal fissures    History of kidney stones    History of small bowel obstruction 2004   Hypertension    IBS (irritable bowel syndrome)    Mixed hyperlipidemia    OSA on CPAP    per last study 07-31-2013  severe osa   Primary hypogonadism in male    endocrinologist-  dr Bevelyn Ngo    Past Surgical History:  Procedure Laterality Date   CARDIOVASCULAR STRESS TEST  10/05/2009   normal nuclear study w/ no ischemia/  normal LV function and wall motion , ef 71%   COLONOSCOPY  last one   2004, april 2021   EVALUATION UNDER ANESTHESIA WITH ANAL FISTULECTOMY N/A 02/13/2018   Procedure:  ANAL EXAM UNDER ANESTHESIA WITH BIOPSY;  Surgeon: Romie Levee, MD;  Location: [facility] [place];  Service: General;  Laterality: N/A;   ORCHIECTOMY Left 1991   w/ placement prosthesis (for torsion)   TESTICULAR EXPLORATION Left 01/29/2020   Procedure: EXCHANGE OF LEFT TESTICULAR PROSTHESIS;  Surgeon: Malen Gauze, MD;  Location: [facility];  Service: Urology;  Laterality: Left;   UPPER GI ENDOSCOPY N/A 06/05/2021   Procedure: UPPER GI ENDOSCOPY;  Surgeon: Luretha Murphy, MD;  Location: [facility];  Service: General;  Laterality: N/A;   URETEROLITHOTOMY  1999    Social History   Socioeconomic History   Marital status: Single    Spouse name: Not on file   Number of children: Not on file   Years of education: Not on file   Highest education level: Master's degree (e.g., MA, MS, MEng, MEd, MSW, MBA)  Occupational History   Occupation: RN   Tobacco Use   Smoking status: Former    Current packs/day: 0.00    Average packs/day: 1 pack/day for 10.0 years (10.0 ttl pk-yrs)    Types: Cigarettes    Start date: 02/08/1999    Quit date: 02/07/2009    Years since quitting: 14.7  Smokeless tobacco: Never  Vaping Use   Vaping status: Never Used  Substance and Sexual Activity   Alcohol use: Not Currently   Drug use: No   Sexual activity: Yes    Partners: Male  Other Topics Concern   Not on file  Social History Narrative   Lives with partner   Regular exercise: no   Caffeine use: 1 large cup of coffee daily; 2 to 3 sodas in the evening   Social Drivers of Health   Financial Resource Strain: Low Risk  (08/20/2023)   Overall Financial Resource Strain (CARDIA)    Difficulty of Paying Living Expenses: Not hard at all  Food Insecurity: No Food Insecurity (08/20/2023)   Hunger Vital Sign    Worried About Running Out of Food in the Last Year: Never true    Ran Out of Food in the Last Year: Never true  Transportation Needs: No Transportation Needs (08/20/2023)    PRAPARE - Administrator, Civil Service (Medical): No    Lack of Transportation (Non-Medical): No  Physical Activity: Unknown (08/20/2023)   Exercise Vital Sign    Days of Exercise per Week: 0 days    Minutes of Exercise per Session: Not on file  Stress: No Stress Concern Present (08/20/2023)   [employer] of Occupational Health - Occupational Stress Questionnaire    Feeling of Stress : Only a little  Social Connections: Moderately Isolated (08/20/2023)   Social Connection and Isolation Panel [NHANES]    Frequency of Communication with Friends and Family: More than three times a week    Frequency of Social Gatherings with Friends and Family: More than three times a week    Attends Religious Services: Never    Database administrator or Organizations: No    Attends Engineer, structural: Not on file    Marital Status: Living with partner    Family History  Problem Relation Age of Onset   Cancer Mother        breast cancer: stage I   Hypertension Mother    Irritable bowel syndrome Mother    Heart disease Maternal Grandfather    Diabetes Paternal Grandmother    Colon cancer Maternal Aunt        great aunt   Esophageal cancer Neg Hx    Rectal cancer Neg Hx    Stomach cancer Neg Hx     Health Maintenance  Topic Date Due   Hepatitis C Screening  Never done   Pneumococcal Vaccine 50-62 Years old (2 of 2 - PCV) 02/17/2020   OPHTHALMOLOGY EXAM  06/28/2022   COVID-19 Vaccine (4 - 2024-25 season) 04/07/2023   DTaP/Tdap/Td (2 - Td or Tdap) 01/27/2024   FOOT EXAM  02/18/2024   HEMOGLOBIN A1C  02/18/2024   Diabetic kidney evaluation - eGFR measurement  08/20/2024   Diabetic kidney evaluation - Urine ACR  08/20/2024   Colonoscopy  10/07/2029   INFLUENZA VACCINE  Completed   HIV Screening  Completed   HPV VACCINES  Aged Out      ----------------------------------------------------------------------------------------------------------------------------------------------------------------------------------------------------------------- Physical Exam BP 102/70 (BP Location: Left Arm, Patient Position: Sitting, Cuff Size: Normal)   Pulse 84   Ht 6' (1.829 m)   Wt 195 lb (88.5 kg)   SpO2 98%   BMI 26.45 kg/m   Physical Exam Constitutional:      Appearance: Normal appearance.  HENT:     Head: Normocephalic and atraumatic.  Musculoskeletal:     Comments: Dry, scaly erythematous patches  on bilateral lower extremities.  Neurological:     Mental Status: He is alert.  Psychiatric:        Mood and Affect: Mood normal.        Behavior: Behavior normal.     ------------------------------------------------------------------------------------------------------------------------------------------------------------------------------------------------------------------- Assessment and Plan  Eczema Adding triamcinolone twice daily as needed.  OSA (obstructive sleep apnea) Compliance report indicates that he is using CPAP 90% of the time for greater than 4 hours each night.  He is tolerating this well with good benefit from use of this.   Meds ordered this encounter  Medications   triamcinolone cream (KENALOG) 0.1 %    Sig: Apply 1 Application topically 2 (two) times daily.    Dispense:  30 g    Refill:  0    No follow-ups on file.    This visit occurred during the SARS-CoV-2 public health emergency.  Safety protocols were in place, including screening questions prior to the visit, additional usage of staff PPE, and extensive cleaning of exam room while observing appropriate contact time as indicated for disinfecting solutions.

## 2023-10-31 NOTE — Assessment & Plan Note (Signed)
 Compliance report indicates that he is using CPAP 90% of the time for greater than 4 hours each night.  He is tolerating this well with good benefit from use of this.

## 2023-10-31 NOTE — Assessment & Plan Note (Signed)
 Adding triamcinolone twice daily as needed.

## 2023-11-01 DIAGNOSIS — F332 Major depressive disorder, recurrent severe without psychotic features: Secondary | ICD-10-CM | POA: Diagnosis not present

## 2023-11-04 DIAGNOSIS — F332 Major depressive disorder, recurrent severe without psychotic features: Secondary | ICD-10-CM | POA: Diagnosis not present

## 2023-11-05 DIAGNOSIS — F332 Major depressive disorder, recurrent severe without psychotic features: Secondary | ICD-10-CM | POA: Diagnosis not present

## 2023-11-06 DIAGNOSIS — F332 Major depressive disorder, recurrent severe without psychotic features: Secondary | ICD-10-CM | POA: Diagnosis not present

## 2023-11-07 DIAGNOSIS — F332 Major depressive disorder, recurrent severe without psychotic features: Secondary | ICD-10-CM | POA: Diagnosis not present

## 2023-11-08 ENCOUNTER — Other Ambulatory Visit (HOSPITAL_BASED_OUTPATIENT_CLINIC_OR_DEPARTMENT_OTHER): Payer: Self-pay

## 2023-11-08 DIAGNOSIS — F332 Major depressive disorder, recurrent severe without psychotic features: Secondary | ICD-10-CM | POA: Diagnosis not present

## 2023-11-08 MED ORDER — MOUNJARO 12.5 MG/0.5ML ~~LOC~~ SOAJ
12.5000 mg | SUBCUTANEOUS | 0 refills | Status: DC
  Filled 2023-11-08: qty 2, 28d supply, fill #0

## 2023-11-08 MED ORDER — LISDEXAMFETAMINE DIMESYLATE 40 MG PO CAPS
40.0000 mg | ORAL_CAPSULE | Freq: Every morning | ORAL | 0 refills | Status: DC
  Filled 2023-11-08: qty 30, 30d supply, fill #0

## 2023-11-08 MED ORDER — DOXYCYCLINE MONOHYDRATE 100 MG PO CAPS
100.0000 mg | ORAL_CAPSULE | Freq: Two times a day (BID) | ORAL | 0 refills | Status: DC
  Filled 2023-11-08: qty 60, 30d supply, fill #0

## 2023-11-09 ENCOUNTER — Other Ambulatory Visit (HOSPITAL_BASED_OUTPATIENT_CLINIC_OR_DEPARTMENT_OTHER): Payer: Self-pay

## 2023-11-11 ENCOUNTER — Encounter: Payer: Self-pay | Admitting: Family Medicine

## 2023-11-11 DIAGNOSIS — F332 Major depressive disorder, recurrent severe without psychotic features: Secondary | ICD-10-CM | POA: Diagnosis not present

## 2023-11-12 DIAGNOSIS — F332 Major depressive disorder, recurrent severe without psychotic features: Secondary | ICD-10-CM | POA: Diagnosis not present

## 2023-11-13 ENCOUNTER — Other Ambulatory Visit: Payer: Self-pay | Admitting: Family Medicine

## 2023-11-13 ENCOUNTER — Other Ambulatory Visit (HOSPITAL_BASED_OUTPATIENT_CLINIC_OR_DEPARTMENT_OTHER): Payer: Self-pay

## 2023-11-13 DIAGNOSIS — F332 Major depressive disorder, recurrent severe without psychotic features: Secondary | ICD-10-CM | POA: Diagnosis not present

## 2023-11-13 MED ORDER — ATENOLOL 100 MG PO TABS
100.0000 mg | ORAL_TABLET | ORAL | 1 refills | Status: DC
  Filled 2023-11-13: qty 90, 90d supply, fill #0
  Filled 2024-02-10: qty 90, 90d supply, fill #1

## 2023-11-13 MED ORDER — AMBULATORY NON FORMULARY MEDICATION: 0 refills | Status: AC

## 2023-11-14 ENCOUNTER — Other Ambulatory Visit (HOSPITAL_BASED_OUTPATIENT_CLINIC_OR_DEPARTMENT_OTHER): Payer: Self-pay

## 2023-11-14 ENCOUNTER — Telehealth: Payer: Self-pay | Admitting: Family Medicine

## 2023-11-14 DIAGNOSIS — F332 Major depressive disorder, recurrent severe without psychotic features: Secondary | ICD-10-CM | POA: Diagnosis not present

## 2023-11-14 NOTE — Telephone Encounter (Signed)
 Copied from CRM [id number]. Topic: General - Other >> Nov 14, 2023 10:10 AM Corin V wrote: Reason for CRM: Corrie Dandy with [employer] is working on the CPAP order for the patient. Since office notes received were dated 3/27, they need the CPAP order to be dated 10/31/23 or after. Please update order and fax to [phone] for them to finalize order.

## 2023-11-15 ENCOUNTER — Other Ambulatory Visit: Payer: Self-pay | Admitting: Family Medicine

## 2023-11-15 DIAGNOSIS — F332 Major depressive disorder, recurrent severe without psychotic features: Secondary | ICD-10-CM | POA: Diagnosis not present

## 2023-11-16 ENCOUNTER — Other Ambulatory Visit (HOSPITAL_BASED_OUTPATIENT_CLINIC_OR_DEPARTMENT_OTHER): Payer: Self-pay

## 2023-11-18 ENCOUNTER — Other Ambulatory Visit (HOSPITAL_BASED_OUTPATIENT_CLINIC_OR_DEPARTMENT_OTHER): Payer: Self-pay

## 2023-11-18 ENCOUNTER — Other Ambulatory Visit: Payer: Self-pay | Admitting: Family Medicine

## 2023-11-18 DIAGNOSIS — F332 Major depressive disorder, recurrent severe without psychotic features: Secondary | ICD-10-CM | POA: Diagnosis not present

## 2023-11-18 DIAGNOSIS — K2970 Gastritis, unspecified, without bleeding: Secondary | ICD-10-CM

## 2023-11-18 DIAGNOSIS — R1310 Dysphagia, unspecified: Secondary | ICD-10-CM

## 2023-11-18 MED ORDER — PANTOPRAZOLE SODIUM 40 MG PO TBEC
40.0000 mg | DELAYED_RELEASE_TABLET | Freq: Two times a day (BID) | ORAL | 2 refills | Status: DC
  Filled 2023-11-18: qty 60, 30d supply, fill #0
  Filled 2023-12-18: qty 60, 30d supply, fill #1
  Filled 2024-01-17: qty 60, 30d supply, fill #2

## 2023-11-19 ENCOUNTER — Other Ambulatory Visit (HOSPITAL_BASED_OUTPATIENT_CLINIC_OR_DEPARTMENT_OTHER): Payer: Self-pay

## 2023-11-20 DIAGNOSIS — F332 Major depressive disorder, recurrent severe without psychotic features: Secondary | ICD-10-CM | POA: Diagnosis not present

## 2023-11-21 ENCOUNTER — Other Ambulatory Visit: Payer: Self-pay

## 2023-11-22 ENCOUNTER — Other Ambulatory Visit (HOSPITAL_BASED_OUTPATIENT_CLINIC_OR_DEPARTMENT_OTHER): Payer: Self-pay

## 2023-11-22 DIAGNOSIS — F332 Major depressive disorder, recurrent severe without psychotic features: Secondary | ICD-10-CM | POA: Diagnosis not present

## 2023-11-25 DIAGNOSIS — F332 Major depressive disorder, recurrent severe without psychotic features: Secondary | ICD-10-CM | POA: Diagnosis not present

## 2023-11-29 ENCOUNTER — Other Ambulatory Visit (HOSPITAL_BASED_OUTPATIENT_CLINIC_OR_DEPARTMENT_OTHER): Payer: Self-pay

## 2023-11-29 MED ORDER — VITAMIN D (ERGOCALCIFEROL) 1.25 MG (50000 UNIT) PO CAPS
50000.0000 [IU] | ORAL_CAPSULE | ORAL | 0 refills | Status: DC
  Filled 2023-11-29: qty 12, 84d supply, fill #0
  Filled 2024-02-17 – 2024-02-19 (×2): qty 12, 84d supply, fill #1
  Filled 2024-02-22: qty 1, 7d supply, fill #1
  Filled 2024-04-23 – 2024-07-29 (×3): qty 12, 84d supply, fill #1
  Filled 2024-08-04: qty 1, 7d supply, fill #1
  Filled 2024-08-05 – 2024-08-12 (×7): qty 12, 84d supply, fill #1
  Filled 2024-08-13: qty 1, 7d supply, fill #1

## 2023-12-04 DIAGNOSIS — G4733 Obstructive sleep apnea (adult) (pediatric): Secondary | ICD-10-CM | POA: Diagnosis not present

## 2023-12-06 ENCOUNTER — Other Ambulatory Visit (HOSPITAL_BASED_OUTPATIENT_CLINIC_OR_DEPARTMENT_OTHER): Payer: Self-pay

## 2023-12-06 MED ORDER — MOUNJARO 12.5 MG/0.5ML ~~LOC~~ SOAJ
12.5000 mg | SUBCUTANEOUS | 0 refills | Status: DC
  Filled 2023-12-06: qty 2, 28d supply, fill #0

## 2023-12-09 ENCOUNTER — Other Ambulatory Visit: Payer: Self-pay

## 2023-12-09 ENCOUNTER — Other Ambulatory Visit (HOSPITAL_BASED_OUTPATIENT_CLINIC_OR_DEPARTMENT_OTHER): Payer: Self-pay

## 2023-12-09 MED ORDER — LISDEXAMFETAMINE DIMESYLATE 40 MG PO CAPS
40.0000 mg | ORAL_CAPSULE | Freq: Every morning | ORAL | 0 refills | Status: DC
  Filled 2023-12-09: qty 30, 30d supply, fill #0

## 2023-12-12 ENCOUNTER — Other Ambulatory Visit (HOSPITAL_BASED_OUTPATIENT_CLINIC_OR_DEPARTMENT_OTHER): Payer: Self-pay

## 2023-12-12 MED ORDER — DOXYCYCLINE MONOHYDRATE 100 MG PO CAPS
100.0000 mg | ORAL_CAPSULE | Freq: Two times a day (BID) | ORAL | 0 refills | Status: DC
  Filled 2023-12-12: qty 60, 30d supply, fill #0

## 2023-12-13 DIAGNOSIS — F4312 Post-traumatic stress disorder, chronic: Secondary | ICD-10-CM | POA: Diagnosis not present

## 2023-12-18 ENCOUNTER — Other Ambulatory Visit: Payer: Self-pay | Admitting: Family Medicine

## 2023-12-18 ENCOUNTER — Other Ambulatory Visit (HOSPITAL_BASED_OUTPATIENT_CLINIC_OR_DEPARTMENT_OTHER): Payer: Self-pay

## 2023-12-18 ENCOUNTER — Other Ambulatory Visit: Payer: Self-pay

## 2023-12-18 MED ORDER — POTASSIUM CHLORIDE CRYS ER 10 MEQ PO TBCR
10.0000 meq | EXTENDED_RELEASE_TABLET | Freq: Every day | ORAL | 1 refills | Status: DC
  Filled 2023-12-18 – 2023-12-19 (×2): qty 90, 90d supply, fill #0
  Filled 2024-03-17: qty 90, 90d supply, fill #1

## 2023-12-19 ENCOUNTER — Other Ambulatory Visit (HOSPITAL_BASED_OUTPATIENT_CLINIC_OR_DEPARTMENT_OTHER): Payer: Self-pay

## 2023-12-19 ENCOUNTER — Other Ambulatory Visit: Payer: Self-pay

## 2023-12-19 DIAGNOSIS — L738 Other specified follicular disorders: Secondary | ICD-10-CM | POA: Diagnosis not present

## 2023-12-19 DIAGNOSIS — L0889 Other specified local infections of the skin and subcutaneous tissue: Secondary | ICD-10-CM | POA: Diagnosis not present

## 2023-12-19 MED ORDER — CLINDAMYCIN PHOSPHATE 1 % EX LOTN
TOPICAL_LOTION | CUTANEOUS | 11 refills | Status: DC
  Filled 2023-12-19: qty 60, 30d supply, fill #0
  Filled 2024-01-16: qty 60, 30d supply, fill #1

## 2023-12-20 DIAGNOSIS — F4312 Post-traumatic stress disorder, chronic: Secondary | ICD-10-CM | POA: Diagnosis not present

## 2023-12-27 DIAGNOSIS — F4312 Post-traumatic stress disorder, chronic: Secondary | ICD-10-CM | POA: Diagnosis not present

## 2024-01-03 DIAGNOSIS — F4312 Post-traumatic stress disorder, chronic: Secondary | ICD-10-CM | POA: Diagnosis not present

## 2024-01-03 DIAGNOSIS — G4733 Obstructive sleep apnea (adult) (pediatric): Secondary | ICD-10-CM | POA: Diagnosis not present

## 2024-01-06 ENCOUNTER — Other Ambulatory Visit: Payer: Self-pay

## 2024-01-06 ENCOUNTER — Encounter: Payer: Self-pay | Admitting: Pharmacist

## 2024-01-06 ENCOUNTER — Other Ambulatory Visit (HOSPITAL_COMMUNITY): Payer: Self-pay

## 2024-01-07 ENCOUNTER — Other Ambulatory Visit: Payer: Self-pay

## 2024-01-09 ENCOUNTER — Other Ambulatory Visit (HOSPITAL_COMMUNITY): Payer: Self-pay

## 2024-01-09 ENCOUNTER — Other Ambulatory Visit: Payer: Self-pay

## 2024-01-09 ENCOUNTER — Telehealth: Payer: Self-pay

## 2024-01-09 NOTE — Telephone Encounter (Signed)
 Pharmacy Patient Advocate Encounter   Received notification from CoverMyMeds that prior authorization for Mounjaro  7.5MG /0.5ML auto-injectors is due for renewal.   Insurance verification completed.   The patient is insured through [facility].  Action: Medication is now available without a prior authorization.

## 2024-01-10 ENCOUNTER — Encounter (HOSPITAL_COMMUNITY): Payer: Self-pay | Admitting: *Deleted

## 2024-01-10 ENCOUNTER — Other Ambulatory Visit (HOSPITAL_COMMUNITY): Payer: Self-pay

## 2024-01-10 DIAGNOSIS — F4312 Post-traumatic stress disorder, chronic: Secondary | ICD-10-CM | POA: Diagnosis not present

## 2024-01-13 ENCOUNTER — Other Ambulatory Visit (HOSPITAL_BASED_OUTPATIENT_CLINIC_OR_DEPARTMENT_OTHER): Payer: Self-pay

## 2024-01-18 ENCOUNTER — Other Ambulatory Visit (HOSPITAL_BASED_OUTPATIENT_CLINIC_OR_DEPARTMENT_OTHER): Payer: Self-pay

## 2024-01-23 ENCOUNTER — Other Ambulatory Visit: Payer: Self-pay | Admitting: Family Medicine

## 2024-01-23 ENCOUNTER — Other Ambulatory Visit: Payer: Self-pay

## 2024-01-23 ENCOUNTER — Other Ambulatory Visit (HOSPITAL_BASED_OUTPATIENT_CLINIC_OR_DEPARTMENT_OTHER): Payer: Self-pay

## 2024-01-23 DIAGNOSIS — I10 Essential (primary) hypertension: Secondary | ICD-10-CM

## 2024-01-23 MED ORDER — AMLODIPINE BESYLATE 10 MG PO TABS
10.0000 mg | ORAL_TABLET | Freq: Every morning | ORAL | 1 refills | Status: DC
  Filled 2024-01-23: qty 90, 90d supply, fill #0
  Filled 2024-05-10: qty 90, 90d supply, fill #1

## 2024-01-24 ENCOUNTER — Other Ambulatory Visit (HOSPITAL_BASED_OUTPATIENT_CLINIC_OR_DEPARTMENT_OTHER): Payer: Self-pay

## 2024-01-24 MED ORDER — LAMOTRIGINE 25 MG PO TABS
ORAL_TABLET | ORAL | 0 refills | Status: DC
  Filled 2024-01-24: qty 45, 29d supply, fill #0

## 2024-01-31 ENCOUNTER — Other Ambulatory Visit (HOSPITAL_BASED_OUTPATIENT_CLINIC_OR_DEPARTMENT_OTHER): Payer: Self-pay

## 2024-01-31 DIAGNOSIS — F4312 Post-traumatic stress disorder, chronic: Secondary | ICD-10-CM | POA: Diagnosis not present

## 2024-01-31 MED ORDER — MOUNJARO 15 MG/0.5ML ~~LOC~~ SOAJ
15.0000 mg | SUBCUTANEOUS | 2 refills | Status: DC
  Filled 2024-01-31: qty 2, 28d supply, fill #0
  Filled 2024-02-24 – 2024-07-18 (×3): qty 2, 28d supply, fill #1

## 2024-02-03 DIAGNOSIS — G4733 Obstructive sleep apnea (adult) (pediatric): Secondary | ICD-10-CM | POA: Diagnosis not present

## 2024-02-04 ENCOUNTER — Other Ambulatory Visit (HOSPITAL_BASED_OUTPATIENT_CLINIC_OR_DEPARTMENT_OTHER): Payer: Self-pay

## 2024-02-04 MED ORDER — MOUNJARO 12.5 MG/0.5ML ~~LOC~~ SOAJ
12.5000 mg | SUBCUTANEOUS | 0 refills | Status: DC
  Filled 2024-02-04 – 2024-02-14 (×2): qty 2, 28d supply, fill #0

## 2024-02-05 ENCOUNTER — Other Ambulatory Visit (HOSPITAL_BASED_OUTPATIENT_CLINIC_OR_DEPARTMENT_OTHER): Payer: Self-pay

## 2024-02-05 ENCOUNTER — Encounter: Payer: Self-pay | Admitting: Family Medicine

## 2024-02-05 ENCOUNTER — Ambulatory Visit: Admitting: Family Medicine

## 2024-02-05 VITALS — BP 125/79 | HR 84 | Ht 72.0 in | Wt 191.0 lb

## 2024-02-05 DIAGNOSIS — Z23 Encounter for immunization: Secondary | ICD-10-CM | POA: Diagnosis not present

## 2024-02-05 DIAGNOSIS — E119 Type 2 diabetes mellitus without complications: Secondary | ICD-10-CM

## 2024-02-05 DIAGNOSIS — Z7985 Long-term (current) use of injectable non-insulin antidiabetic drugs: Secondary | ICD-10-CM

## 2024-02-05 DIAGNOSIS — R21 Rash and other nonspecific skin eruption: Secondary | ICD-10-CM

## 2024-02-05 DIAGNOSIS — E781 Pure hyperglyceridemia: Secondary | ICD-10-CM

## 2024-02-05 DIAGNOSIS — I10 Essential (primary) hypertension: Secondary | ICD-10-CM

## 2024-02-05 DIAGNOSIS — Z111 Encounter for screening for respiratory tuberculosis: Secondary | ICD-10-CM

## 2024-02-05 MED ORDER — PREDNISONE 10 MG (21) PO TBPK
ORAL_TABLET | ORAL | 0 refills | Status: DC
  Filled 2024-02-05: qty 21, 6d supply, fill #0

## 2024-02-05 NOTE — Assessment & Plan Note (Signed)
 Has tried antibiotics without improvement.  Biopsy did not really show anything conclusive.  Provide trial of steroids to see if this helps with resolution of lesions.  May need to repeat biopsy or get second opinion from dermatology

## 2024-02-05 NOTE — Progress Notes (Signed)
 Tyler Gross - 48 y.o. male MRN [id number]  Date of birth: [birth year]  Subjective Chief Complaint  Patient presents with   Immunizations   Nevus   Rash    HPI Tyler Gross is a 48 year old male here today with complaint of rash.  He has had recurrent rash along his back.  This is occurred several times over the past several years.  Seen by dermatology and tried on doxycycline  which did not help.  He did have biopsy of lesion which was unremarkable.  He has not tried steroid.  He also needs updated QuantiFERON gold testing as well as Tdap for DNP program.  ROS:  A comprehensive ROS was completed and negative except as noted per HPI    Allergies  Allergen Reactions   Atorvastatin Other (See Comments)    Muscle fatigue   Bee Venom Swelling   Sulfamethoxazole-Trimethoprim Hives    Bactrim     Past Medical History:  Diagnosis Date   Acne    on back taking doxycycline  for   Alcoholism [facility])    sober since 04/2018   Anxiety    Depression    DM type 2 (diabetes mellitus, type 2) (HCC)    ED (erectile dysfunction)    GAD (generalized anxiety disorder)    GERD (gastroesophageal reflux disease)    Gynecomastia, male    followed by dr coral   High serum estradiol     resolved   History of anal fissures    History of kidney stones    History of small bowel obstruction 2004   Hypertension    IBS (irritable bowel syndrome)    Mixed hyperlipidemia    OSA on CPAP    per last study 07-31-2013  severe osa   Primary hypogonadism in male    endocrinologist-  dr coral    Past Surgical History:  Procedure Laterality Date   CARDIOVASCULAR STRESS TEST  10/05/2009   normal nuclear study w/ no ischemia/  normal LV function and wall motion , ef 71%   COLONOSCOPY  last one   2004, april 2021   EVALUATION UNDER ANESTHESIA WITH ANAL FISTULECTOMY N/A 02/13/2018   Procedure: ANAL EXAM UNDER ANESTHESIA WITH BIOPSY;  Surgeon: Debby Hila, MD;  Location: [facility];  Service: General;  Laterality: N/A;   ORCHIECTOMY Left 1991   w/ placement prosthesis (for torsion)   TESTICULAR EXPLORATION Left 01/29/2020   Procedure: EXCHANGE OF LEFT TESTICULAR PROSTHESIS;  Surgeon: Sherrilee Belvie CROME, MD;  Location: [facility];  Service: Urology;  Laterality: Left;   UPPER GI ENDOSCOPY N/A 06/05/2021   Procedure: UPPER GI ENDOSCOPY;  Surgeon: Gladis Cough, MD;  Location: [facility];  Service: General;  Laterality: N/A;   URETEROLITHOTOMY  1999    Social History   Socioeconomic History   Marital status: Single    Spouse name: Not on file   Number of children: Not on file   Years of education: Not on file   Highest education level: Master's degree (e.g., MA, MS, MEng, MEd, MSW, MBA)  Occupational History   Occupation: RN   Tobacco Use   Smoking status: Former    Current packs/day: 0.00    Average packs/day: 1 pack/day for 10.0 years (10.0 ttl pk-yrs)    Types: Cigarettes    Start date: 02/08/1999    Quit date: 02/07/2009    Years since quitting: 15.0   Smokeless tobacco: Never  Vaping Use   Vaping status: Never Used  Substance  and Sexual Activity   Alcohol use: Not Currently   Drug use: No   Sexual activity: Yes    Partners: Male  Other Topics Concern   Not on file  Social History Narrative   Lives with partner   Regular exercise: no   Caffeine use: 1 large cup of coffee daily; 2 to 3 sodas in the evening   Social Drivers of Health   Financial Resource Strain: Low Risk  (08/20/2023)   Overall Financial Resource Strain (CARDIA)    Difficulty of Paying Living Expenses: Not hard at all  Food Insecurity: No Food Insecurity (08/20/2023)   Hunger Vital Sign    Worried About Running Out of Food in the Last Year: Never true    Ran Out of Food in the Last Year: Never true  Transportation Needs: No Transportation Needs (08/20/2023)   PRAPARE - Administrator, Civil Service (Medical): No    Lack of Transportation  (Non-Medical): No  Physical Activity: Unknown (08/20/2023)   Exercise Vital Sign    Days of Exercise per Week: 0 days    Minutes of Exercise per Session: Not on file  Stress: No Stress Concern Present (08/20/2023)   [employer] of Occupational Health - Occupational Stress Questionnaire    Feeling of Stress : Only a little  Social Connections: Moderately Isolated (08/20/2023)   Social Connection and Isolation Panel    Frequency of Communication with Friends and Family: More than three times a week    Frequency of Social Gatherings with Friends and Family: More than three times a week    Attends Religious Services: Never    Database administrator or Organizations: No    Attends Engineer, structural: Not on file    Marital Status: Living with partner    Family History  Problem Relation Age of Onset   Cancer Mother        breast cancer: stage I   Hypertension Mother    Irritable bowel syndrome Mother    Heart disease Maternal Grandfather    Diabetes Paternal Grandmother    Colon cancer Maternal Aunt        great aunt   Esophageal cancer Neg Hx    Rectal cancer Neg Hx    Stomach cancer Neg Hx     Health Maintenance  Topic Date Due   Hepatitis C Screening  Never done   Hepatitis B Vaccines (1 of 3 - 19+ 3-dose series) Never done   Pneumococcal Vaccine 69-13 Years old (2 of 2 - PCV) 02/17/2020   OPHTHALMOLOGY EXAM  06/28/2022   COVID-19 Vaccine (4 - 2024-25 season) 04/07/2023   FOOT EXAM  02/18/2024   HEMOGLOBIN A1C  02/18/2024   INFLUENZA VACCINE  03/06/2024   Diabetic kidney evaluation - eGFR measurement  08/20/2024   Diabetic kidney evaluation - Urine ACR  08/20/2024   Colonoscopy  10/07/2029   DTaP/Tdap/Td (3 - Td or Tdap) 02/04/2034   HIV Screening  Completed   HPV VACCINES  Aged Out   Meningococcal B Vaccine  Aged Out      ----------------------------------------------------------------------------------------------------------------------------------------------------------------------------------------------------------------- Physical Exam BP 125/79 (BP Location: Left Arm, Patient Position: Sitting, Cuff Size: Normal)   Pulse 84   Ht 6' (1.829 m)   Wt 191 lb (86.6 kg)   SpO2 97%   BMI 25.90 kg/m   Physical Exam Constitutional:      Appearance: Normal appearance.  Skin:    Comments: Multiple erythematous, slightly raised lesions on the back.  These are nodular in nature.  Neurological:     Mental Status: He is alert.  Psychiatric:        Mood and Affect: Mood normal.        Behavior: Behavior normal.     ------------------------------------------------------------------------------------------------------------------------------------------------------------------------------------------------------------------- Assessment and Plan  Rash Has tried antibiotics without improvement.  Biopsy did not really show anything conclusive.  Provide trial of steroids to see if this helps with resolution of lesions.  May need to repeat biopsy or get second opinion from dermatology   Meds ordered this encounter  Medications   predniSONE  (STERAPRED UNI-PAK 21 TAB) 10 MG (21) TBPK tablet    Sig: Taper as directed on packaging.    Dispense:  21 tablet    Refill:  0    No follow-ups on file.

## 2024-02-06 ENCOUNTER — Encounter: Payer: Self-pay | Admitting: Family Medicine

## 2024-02-06 ENCOUNTER — Other Ambulatory Visit (HOSPITAL_BASED_OUTPATIENT_CLINIC_OR_DEPARTMENT_OTHER): Payer: Self-pay

## 2024-02-06 MED ORDER — LISDEXAMFETAMINE DIMESYLATE 40 MG PO CAPS
40.0000 mg | ORAL_CAPSULE | Freq: Every morning | ORAL | 0 refills | Status: DC
  Filled 2024-02-06: qty 30, 30d supply, fill #0

## 2024-02-09 ENCOUNTER — Ambulatory Visit: Payer: Self-pay | Admitting: Family Medicine

## 2024-02-09 LAB — QUANTIFERON-TB GOLD PLUS
QuantiFERON Mitogen Value: >10 [IU]/mL
QuantiFERON Nil Value: 0.05 [IU]/mL
QuantiFERON TB1 Ag Value: 0.05 [IU]/mL
QuantiFERON TB2 Ag Value: 0.05 [IU]/mL
QuantiFERON-TB Gold Plus: NEGATIVE

## 2024-02-10 ENCOUNTER — Other Ambulatory Visit (HOSPITAL_BASED_OUTPATIENT_CLINIC_OR_DEPARTMENT_OTHER): Payer: Self-pay

## 2024-02-11 DIAGNOSIS — E781 Pure hyperglyceridemia: Secondary | ICD-10-CM | POA: Diagnosis not present

## 2024-02-11 DIAGNOSIS — I10 Essential (primary) hypertension: Secondary | ICD-10-CM | POA: Diagnosis not present

## 2024-02-11 DIAGNOSIS — E119 Type 2 diabetes mellitus without complications: Secondary | ICD-10-CM | POA: Diagnosis not present

## 2024-02-12 LAB — CMP14+EGFR
ALT: 29 IU/L (ref 0–44)
AST: 26 IU/L (ref 0–40)
Albumin: 4.8 g/dL (ref 4.1–5.1)
Alkaline Phosphatase: 57 IU/L (ref 44–121)
BUN/Creatinine Ratio: 16 (ref 9–20)
BUN: 18 mg/dL (ref 6–24)
Bilirubin Total: 0.4 mg/dL (ref 0.0–1.2)
CO2: 22 mmol/L (ref 20–29)
Calcium: 9.7 mg/dL (ref 8.7–10.2)
Chloride: 102 mmol/L (ref 96–106)
Creatinine, Ser: 1.11 mg/dL (ref 0.76–1.27)
Globulin, Total: 1.9 g/dL (ref 1.5–4.5)
Glucose: 115 mg/dL — ABNORMAL HIGH (ref 70–99)
Potassium: 4.5 mmol/L (ref 3.5–5.2)
Sodium: 142 mmol/L (ref 134–144)
Total Protein: 6.7 g/dL (ref 6.0–8.5)
eGFR: 81 mL/min/1.73 (ref >59)

## 2024-02-12 LAB — CBC WITH DIFFERENTIAL/PLATELET
Basophils Absolute: 0 x10E3/uL (ref 0.0–0.2)
Basos: 0 %
EOS (ABSOLUTE): 0 x10E3/uL (ref 0.0–0.4)
Eos: 0 %
Hematocrit: 45.2 % (ref 37.5–51.0)
Hemoglobin: 14.7 g/dL (ref 13.0–17.7)
Immature Grans (Abs): 0 x10E3/uL (ref 0.0–0.1)
Immature Granulocytes: 0 %
Lymphocytes Absolute: 1.4 x10E3/uL (ref 0.7–3.1)
Lymphs: 10 %
MCH: 30.4 pg (ref 26.6–33.0)
MCHC: 32.5 g/dL (ref 31.5–35.7)
MCV: 94 fL (ref 79–97)
Monocytes Absolute: 0.8 x10E3/uL (ref 0.1–0.9)
Monocytes: 6 %
Neutrophils Absolute: 10.9 x10E3/uL — ABNORMAL HIGH (ref 1.4–7.0)
Neutrophils: 84 %
Platelets: 284 x10E3/uL (ref 150–450)
RBC: 4.83 x10E6/uL (ref 4.14–5.80)
RDW: 12.3 % (ref 11.6–15.4)
WBC: 13.1 x10E3/uL — ABNORMAL HIGH (ref 3.4–10.8)

## 2024-02-12 LAB — LIPID PANEL WITH LDL/HDL RATIO
Cholesterol, Total: 144 mg/dL (ref 100–199)
HDL: 71 mg/dL (ref >39)
LDL Chol Calc (NIH): 58 mg/dL (ref 0–99)
LDL/HDL Ratio: 0.8 ratio (ref 0.0–3.6)
Triglycerides: 80 mg/dL (ref 0–149)
VLDL Cholesterol Cal: 15 mg/dL (ref 5–40)

## 2024-02-12 LAB — HEMOGLOBIN A1C
Est. average glucose Bld gHb Est-mCnc: 103 mg/dL
Hgb A1c MFr Bld: 5.2 % (ref 4.8–5.6)

## 2024-02-13 ENCOUNTER — Other Ambulatory Visit (HOSPITAL_BASED_OUTPATIENT_CLINIC_OR_DEPARTMENT_OTHER): Payer: Self-pay

## 2024-02-14 ENCOUNTER — Other Ambulatory Visit (HOSPITAL_BASED_OUTPATIENT_CLINIC_OR_DEPARTMENT_OTHER): Payer: Self-pay

## 2024-02-14 DIAGNOSIS — F4312 Post-traumatic stress disorder, chronic: Secondary | ICD-10-CM | POA: Diagnosis not present

## 2024-02-15 ENCOUNTER — Other Ambulatory Visit: Payer: Self-pay | Admitting: Family Medicine

## 2024-02-15 DIAGNOSIS — K2970 Gastritis, unspecified, without bleeding: Secondary | ICD-10-CM

## 2024-02-15 DIAGNOSIS — R1310 Dysphagia, unspecified: Secondary | ICD-10-CM

## 2024-02-17 ENCOUNTER — Other Ambulatory Visit: Payer: Self-pay

## 2024-02-17 ENCOUNTER — Other Ambulatory Visit (HOSPITAL_BASED_OUTPATIENT_CLINIC_OR_DEPARTMENT_OTHER): Payer: Self-pay

## 2024-02-17 MED ORDER — ERGOCALCIFEROL 1.25 MG (50000 UT) PO CAPS
50000.0000 [IU] | ORAL_CAPSULE | ORAL | 0 refills | Status: AC
  Filled 2024-02-17 – 2024-02-27 (×2): qty 12, 84d supply, fill #0
  Filled 2024-05-15 – 2024-08-03 (×5): qty 12, 84d supply, fill #1

## 2024-02-17 MED ORDER — PANTOPRAZOLE SODIUM 40 MG PO TBEC
40.0000 mg | DELAYED_RELEASE_TABLET | Freq: Two times a day (BID) | ORAL | 2 refills | Status: DC
Start: 2024-02-17 — End: 2024-05-21
  Filled 2024-02-17 – 2024-02-27 (×2): qty 60, 30d supply, fill #0
  Filled 2024-03-24: qty 60, 30d supply, fill #1
  Filled 2024-04-23: qty 60, 30d supply, fill #2

## 2024-02-18 ENCOUNTER — Other Ambulatory Visit (HOSPITAL_BASED_OUTPATIENT_CLINIC_OR_DEPARTMENT_OTHER): Payer: Self-pay

## 2024-02-18 ENCOUNTER — Other Ambulatory Visit: Payer: Self-pay

## 2024-02-18 ENCOUNTER — Ambulatory Visit: Payer: BC Managed Care – PPO | Admitting: Family Medicine

## 2024-02-18 MED ORDER — LAMOTRIGINE 25 MG PO TABS
50.0000 mg | ORAL_TABLET | Freq: Every day | ORAL | 0 refills | Status: DC
  Filled 2024-02-18 – 2024-02-27 (×2): qty 60, 30d supply, fill #0

## 2024-02-19 ENCOUNTER — Ambulatory Visit: Payer: BC Managed Care – PPO | Admitting: Family Medicine

## 2024-02-19 ENCOUNTER — Other Ambulatory Visit (HOSPITAL_BASED_OUTPATIENT_CLINIC_OR_DEPARTMENT_OTHER): Payer: Self-pay

## 2024-02-22 ENCOUNTER — Other Ambulatory Visit (HOSPITAL_BASED_OUTPATIENT_CLINIC_OR_DEPARTMENT_OTHER): Payer: Self-pay

## 2024-02-25 ENCOUNTER — Other Ambulatory Visit (HOSPITAL_BASED_OUTPATIENT_CLINIC_OR_DEPARTMENT_OTHER): Payer: Self-pay

## 2024-02-27 ENCOUNTER — Other Ambulatory Visit (HOSPITAL_BASED_OUTPATIENT_CLINIC_OR_DEPARTMENT_OTHER): Payer: Self-pay

## 2024-02-28 DIAGNOSIS — F4312 Post-traumatic stress disorder, chronic: Secondary | ICD-10-CM | POA: Diagnosis not present

## 2024-03-02 ENCOUNTER — Other Ambulatory Visit (HOSPITAL_BASED_OUTPATIENT_CLINIC_OR_DEPARTMENT_OTHER): Payer: Self-pay

## 2024-03-02 ENCOUNTER — Other Ambulatory Visit (HOSPITAL_COMMUNITY): Payer: Self-pay

## 2024-03-02 MED ORDER — MOUNJARO 12.5 MG/0.5ML ~~LOC~~ SOAJ
12.5000 mg | SUBCUTANEOUS | 1 refills | Status: AC
  Filled 2024-03-02 – 2024-05-01 (×5): qty 4, 56d supply, fill #0
  Filled 2024-07-18 – 2024-07-24 (×2): qty 2, 28d supply, fill #1

## 2024-03-04 DIAGNOSIS — G4733 Obstructive sleep apnea (adult) (pediatric): Secondary | ICD-10-CM | POA: Diagnosis not present

## 2024-03-10 ENCOUNTER — Other Ambulatory Visit (HOSPITAL_BASED_OUTPATIENT_CLINIC_OR_DEPARTMENT_OTHER): Payer: Self-pay

## 2024-03-10 MED ORDER — LISDEXAMFETAMINE DIMESYLATE 40 MG PO CAPS
40.0000 mg | ORAL_CAPSULE | Freq: Every morning | ORAL | 0 refills | Status: DC
  Filled 2024-03-10: qty 30, 30d supply, fill #0

## 2024-03-12 ENCOUNTER — Other Ambulatory Visit (HOSPITAL_BASED_OUTPATIENT_CLINIC_OR_DEPARTMENT_OTHER): Payer: Self-pay

## 2024-03-12 MED ORDER — MOUNJARO 12.5 MG/0.5ML ~~LOC~~ SOAJ
12.5000 mg | SUBCUTANEOUS | 0 refills | Status: DC
  Filled 2024-03-12: qty 2, 28d supply, fill #0

## 2024-03-17 ENCOUNTER — Other Ambulatory Visit (HOSPITAL_BASED_OUTPATIENT_CLINIC_OR_DEPARTMENT_OTHER): Payer: Self-pay

## 2024-03-17 MED ORDER — MOUNJARO 12.5 MG/0.5ML ~~LOC~~ SOAJ
12.5000 mg | SUBCUTANEOUS | 0 refills | Status: DC
  Filled 2024-03-17 – 2024-04-03 (×5): qty 2, 28d supply, fill #0

## 2024-03-18 ENCOUNTER — Other Ambulatory Visit (HOSPITAL_BASED_OUTPATIENT_CLINIC_OR_DEPARTMENT_OTHER): Payer: Self-pay

## 2024-03-19 ENCOUNTER — Other Ambulatory Visit (HOSPITAL_BASED_OUTPATIENT_CLINIC_OR_DEPARTMENT_OTHER): Payer: Self-pay

## 2024-03-19 ENCOUNTER — Other Ambulatory Visit: Payer: Self-pay

## 2024-03-20 ENCOUNTER — Other Ambulatory Visit (HOSPITAL_BASED_OUTPATIENT_CLINIC_OR_DEPARTMENT_OTHER): Payer: Self-pay

## 2024-03-20 ENCOUNTER — Encounter: Payer: Self-pay | Admitting: Family Medicine

## 2024-03-23 ENCOUNTER — Other Ambulatory Visit (HOSPITAL_BASED_OUTPATIENT_CLINIC_OR_DEPARTMENT_OTHER): Payer: Self-pay

## 2024-03-23 MED ORDER — LAMOTRIGINE 25 MG PO TABS
50.0000 mg | ORAL_TABLET | Freq: Every day | ORAL | 0 refills | Status: DC
  Filled 2024-03-23: qty 60, 30d supply, fill #0

## 2024-03-23 MED ORDER — PREDNISONE 10 MG PO TABS
ORAL_TABLET | ORAL | 0 refills | Status: DC
  Filled 2024-03-23: qty 48, 12d supply, fill #0

## 2024-04-03 ENCOUNTER — Other Ambulatory Visit (HOSPITAL_BASED_OUTPATIENT_CLINIC_OR_DEPARTMENT_OTHER): Payer: Self-pay

## 2024-04-05 ENCOUNTER — Other Ambulatory Visit (HOSPITAL_COMMUNITY): Payer: Self-pay

## 2024-04-07 ENCOUNTER — Other Ambulatory Visit (HOSPITAL_COMMUNITY): Payer: Self-pay

## 2024-04-07 ENCOUNTER — Other Ambulatory Visit (HOSPITAL_BASED_OUTPATIENT_CLINIC_OR_DEPARTMENT_OTHER): Payer: Self-pay

## 2024-04-07 MED ORDER — LAMOTRIGINE 100 MG PO TABS
100.0000 mg | ORAL_TABLET | Freq: Every day | ORAL | 1 refills | Status: DC
  Filled 2024-04-07: qty 90, 90d supply, fill #0
  Filled 2024-07-01: qty 90, 90d supply, fill #1

## 2024-04-07 MED ORDER — LISDEXAMFETAMINE DIMESYLATE 40 MG PO CAPS
40.0000 mg | ORAL_CAPSULE | Freq: Every morning | ORAL | 0 refills | Status: DC
  Filled 2024-04-07: qty 30, 30d supply, fill #0

## 2024-04-08 ENCOUNTER — Other Ambulatory Visit: Payer: Self-pay

## 2024-04-08 ENCOUNTER — Other Ambulatory Visit (HOSPITAL_BASED_OUTPATIENT_CLINIC_OR_DEPARTMENT_OTHER): Payer: Self-pay

## 2024-04-08 MED ORDER — CLOMIPHENE CITRATE 50 MG PO TABS
ORAL_TABLET | ORAL | 3 refills | Status: DC
  Filled 2024-04-08: qty 36, 90d supply, fill #0
  Filled 2024-06-28: qty 36, 90d supply, fill #1

## 2024-04-09 ENCOUNTER — Other Ambulatory Visit (HOSPITAL_BASED_OUTPATIENT_CLINIC_OR_DEPARTMENT_OTHER): Payer: Self-pay

## 2024-04-09 MED ORDER — BETAMETHASONE DIPROPIONATE AUG 0.05 % EX OINT
TOPICAL_OINTMENT | CUTANEOUS | 1 refills | Status: DC
  Filled 2024-04-09: qty 50, 30d supply, fill #0
  Filled 2024-05-02: qty 50, 30d supply, fill #1
  Filled 2024-06-01: qty 50, 30d supply, fill #2
  Filled 2024-07-01: qty 50, 30d supply, fill #3

## 2024-04-10 ENCOUNTER — Other Ambulatory Visit (HOSPITAL_COMMUNITY): Payer: Self-pay

## 2024-04-20 ENCOUNTER — Other Ambulatory Visit (HOSPITAL_BASED_OUTPATIENT_CLINIC_OR_DEPARTMENT_OTHER): Payer: Self-pay

## 2024-04-20 ENCOUNTER — Other Ambulatory Visit: Payer: Self-pay | Admitting: Family Medicine

## 2024-04-20 MED ORDER — LISINOPRIL-HYDROCHLOROTHIAZIDE 20-25 MG PO TABS
1.0000 | ORAL_TABLET | Freq: Every day | ORAL | 1 refills | Status: AC
  Filled 2024-04-23: qty 90, 90d supply, fill #0
  Filled 2024-07-30: qty 90, 90d supply, fill #1

## 2024-04-21 ENCOUNTER — Other Ambulatory Visit (HOSPITAL_BASED_OUTPATIENT_CLINIC_OR_DEPARTMENT_OTHER): Payer: Self-pay

## 2024-04-23 ENCOUNTER — Other Ambulatory Visit (HOSPITAL_BASED_OUTPATIENT_CLINIC_OR_DEPARTMENT_OTHER): Payer: Self-pay

## 2024-04-24 ENCOUNTER — Other Ambulatory Visit: Payer: Self-pay

## 2024-04-24 ENCOUNTER — Other Ambulatory Visit (HOSPITAL_BASED_OUTPATIENT_CLINIC_OR_DEPARTMENT_OTHER): Payer: Self-pay

## 2024-04-27 ENCOUNTER — Other Ambulatory Visit (HOSPITAL_BASED_OUTPATIENT_CLINIC_OR_DEPARTMENT_OTHER): Payer: Self-pay

## 2024-04-27 MED ORDER — VITAMIN D (ERGOCALCIFEROL) 1.25 MG (50000 UNIT) PO CAPS
50000.0000 [IU] | ORAL_CAPSULE | ORAL | 0 refills | Status: DC
  Filled 2024-04-27 – 2024-05-04 (×2): qty 12, 84d supply, fill #0
  Filled 2024-08-17 (×2): qty 1, 7d supply, fill #1
  Filled 2024-08-18 – 2024-08-20 (×3): qty 12, 84d supply, fill #1

## 2024-04-28 ENCOUNTER — Other Ambulatory Visit (HOSPITAL_BASED_OUTPATIENT_CLINIC_OR_DEPARTMENT_OTHER): Payer: Self-pay

## 2024-04-28 MED ORDER — LISDEXAMFETAMINE DIMESYLATE 50 MG PO CAPS
50.0000 mg | ORAL_CAPSULE | Freq: Every morning | ORAL | 0 refills | Status: DC
  Filled 2024-04-28: qty 30, 30d supply, fill #0

## 2024-04-29 ENCOUNTER — Other Ambulatory Visit (HOSPITAL_BASED_OUTPATIENT_CLINIC_OR_DEPARTMENT_OTHER): Payer: Self-pay

## 2024-04-29 DIAGNOSIS — E6609 Other obesity due to excess calories: Secondary | ICD-10-CM | POA: Diagnosis not present

## 2024-04-29 DIAGNOSIS — F322 Major depressive disorder, single episode, severe without psychotic features: Secondary | ICD-10-CM | POA: Diagnosis not present

## 2024-04-29 DIAGNOSIS — F411 Generalized anxiety disorder: Secondary | ICD-10-CM | POA: Diagnosis not present

## 2024-04-29 DIAGNOSIS — F5081 Binge eating disorder, mild: Secondary | ICD-10-CM | POA: Diagnosis not present

## 2024-05-01 ENCOUNTER — Other Ambulatory Visit (HOSPITAL_BASED_OUTPATIENT_CLINIC_OR_DEPARTMENT_OTHER): Payer: Self-pay

## 2024-05-04 ENCOUNTER — Other Ambulatory Visit (HOSPITAL_BASED_OUTPATIENT_CLINIC_OR_DEPARTMENT_OTHER): Payer: Self-pay

## 2024-05-09 ENCOUNTER — Other Ambulatory Visit: Payer: Self-pay | Admitting: Family Medicine

## 2024-05-10 ENCOUNTER — Other Ambulatory Visit (HOSPITAL_BASED_OUTPATIENT_CLINIC_OR_DEPARTMENT_OTHER): Payer: Self-pay

## 2024-05-11 ENCOUNTER — Other Ambulatory Visit (HOSPITAL_BASED_OUTPATIENT_CLINIC_OR_DEPARTMENT_OTHER): Payer: Self-pay

## 2024-05-11 ENCOUNTER — Other Ambulatory Visit: Payer: Self-pay

## 2024-05-11 MED ORDER — ATENOLOL 100 MG PO TABS
100.0000 mg | ORAL_TABLET | ORAL | 1 refills | Status: AC
  Filled 2024-05-11: qty 90, 90d supply, fill #0
  Filled 2024-08-07: qty 30, 30d supply, fill #1
  Filled 2024-09-07: qty 30, 30d supply, fill #2

## 2024-05-12 ENCOUNTER — Other Ambulatory Visit (HOSPITAL_BASED_OUTPATIENT_CLINIC_OR_DEPARTMENT_OTHER): Payer: Self-pay

## 2024-05-12 MED ORDER — ANASTROZOLE 1 MG PO TABS
1.0000 mg | ORAL_TABLET | ORAL | 1 refills | Status: AC
  Filled 2024-05-12: qty 36, 84d supply, fill #0
  Filled 2024-08-01 – 2024-08-25 (×3): qty 36, 84d supply, fill #1

## 2024-05-15 ENCOUNTER — Other Ambulatory Visit (HOSPITAL_BASED_OUTPATIENT_CLINIC_OR_DEPARTMENT_OTHER): Payer: Self-pay

## 2024-05-15 MED ORDER — ANASTROZOLE 1 MG PO TABS
1.0000 mg | ORAL_TABLET | ORAL | 1 refills | Status: DC
  Filled 2024-05-15 – 2024-07-18 (×10): qty 36, 84d supply, fill #0

## 2024-05-16 ENCOUNTER — Other Ambulatory Visit (HOSPITAL_BASED_OUTPATIENT_CLINIC_OR_DEPARTMENT_OTHER): Payer: Self-pay

## 2024-05-18 ENCOUNTER — Other Ambulatory Visit (HOSPITAL_BASED_OUTPATIENT_CLINIC_OR_DEPARTMENT_OTHER): Payer: Self-pay

## 2024-05-19 ENCOUNTER — Other Ambulatory Visit (HOSPITAL_BASED_OUTPATIENT_CLINIC_OR_DEPARTMENT_OTHER): Payer: Self-pay

## 2024-05-20 ENCOUNTER — Other Ambulatory Visit (HOSPITAL_BASED_OUTPATIENT_CLINIC_OR_DEPARTMENT_OTHER): Payer: Self-pay

## 2024-05-21 ENCOUNTER — Other Ambulatory Visit: Payer: Self-pay | Admitting: Family Medicine

## 2024-05-21 ENCOUNTER — Other Ambulatory Visit (HOSPITAL_BASED_OUTPATIENT_CLINIC_OR_DEPARTMENT_OTHER): Payer: Self-pay

## 2024-05-21 DIAGNOSIS — K2970 Gastritis, unspecified, without bleeding: Secondary | ICD-10-CM

## 2024-05-21 DIAGNOSIS — R1310 Dysphagia, unspecified: Secondary | ICD-10-CM

## 2024-05-21 MED ORDER — PANTOPRAZOLE SODIUM 40 MG PO TBEC
40.0000 mg | DELAYED_RELEASE_TABLET | Freq: Two times a day (BID) | ORAL | 2 refills | Status: AC
  Filled 2024-05-23: qty 180, 90d supply, fill #0
  Filled 2024-08-25: qty 180, 90d supply, fill #1

## 2024-05-22 ENCOUNTER — Other Ambulatory Visit (HOSPITAL_BASED_OUTPATIENT_CLINIC_OR_DEPARTMENT_OTHER): Payer: Self-pay

## 2024-05-23 ENCOUNTER — Other Ambulatory Visit (HOSPITAL_BASED_OUTPATIENT_CLINIC_OR_DEPARTMENT_OTHER): Payer: Self-pay

## 2024-05-25 ENCOUNTER — Other Ambulatory Visit: Payer: Self-pay

## 2024-05-26 ENCOUNTER — Other Ambulatory Visit (HOSPITAL_BASED_OUTPATIENT_CLINIC_OR_DEPARTMENT_OTHER): Payer: Self-pay

## 2024-05-26 MED ORDER — LISDEXAMFETAMINE DIMESYLATE 50 MG PO CAPS
50.0000 mg | ORAL_CAPSULE | Freq: Every morning | ORAL | 0 refills | Status: DC
  Filled 2024-05-28: qty 30, 30d supply, fill #0

## 2024-05-28 ENCOUNTER — Other Ambulatory Visit (HOSPITAL_BASED_OUTPATIENT_CLINIC_OR_DEPARTMENT_OTHER): Payer: Self-pay

## 2024-05-29 ENCOUNTER — Other Ambulatory Visit (HOSPITAL_BASED_OUTPATIENT_CLINIC_OR_DEPARTMENT_OTHER): Payer: Self-pay

## 2024-05-29 DIAGNOSIS — L738 Other specified follicular disorders: Secondary | ICD-10-CM | POA: Diagnosis not present

## 2024-05-29 DIAGNOSIS — L720 Epidermal cyst: Secondary | ICD-10-CM | POA: Diagnosis not present

## 2024-05-29 MED ORDER — DOXYCYCLINE HYCLATE 100 MG PO CAPS
100.0000 mg | ORAL_CAPSULE | Freq: Two times a day (BID) | ORAL | 0 refills | Status: AC
  Filled 2024-05-29: qty 20, 10d supply, fill #0

## 2024-06-09 ENCOUNTER — Other Ambulatory Visit (HOSPITAL_BASED_OUTPATIENT_CLINIC_OR_DEPARTMENT_OTHER): Payer: Self-pay

## 2024-06-15 ENCOUNTER — Other Ambulatory Visit: Payer: Self-pay

## 2024-06-15 ENCOUNTER — Other Ambulatory Visit: Payer: Self-pay | Admitting: Family Medicine

## 2024-06-15 ENCOUNTER — Other Ambulatory Visit (HOSPITAL_BASED_OUTPATIENT_CLINIC_OR_DEPARTMENT_OTHER): Payer: Self-pay

## 2024-06-15 MED ORDER — POTASSIUM CHLORIDE CRYS ER 10 MEQ PO TBCR
10.0000 meq | EXTENDED_RELEASE_TABLET | Freq: Every day | ORAL | 1 refills | Status: AC
  Filled 2024-06-15: qty 90, 90d supply, fill #0

## 2024-06-18 ENCOUNTER — Other Ambulatory Visit (HOSPITAL_BASED_OUTPATIENT_CLINIC_OR_DEPARTMENT_OTHER): Payer: Self-pay

## 2024-06-18 MED ORDER — MOUNJARO 12.5 MG/0.5ML ~~LOC~~ SOAJ
12.5000 mg | SUBCUTANEOUS | 2 refills | Status: AC
  Filled 2024-06-18 – 2024-06-19 (×2): qty 2, 28d supply, fill #0
  Filled 2024-08-25: qty 2, 28d supply, fill #1

## 2024-06-19 ENCOUNTER — Other Ambulatory Visit (HOSPITAL_BASED_OUTPATIENT_CLINIC_OR_DEPARTMENT_OTHER): Payer: Self-pay

## 2024-06-19 ENCOUNTER — Other Ambulatory Visit: Payer: Self-pay

## 2024-06-24 ENCOUNTER — Other Ambulatory Visit: Payer: Self-pay

## 2024-06-24 ENCOUNTER — Other Ambulatory Visit (HOSPITAL_BASED_OUTPATIENT_CLINIC_OR_DEPARTMENT_OTHER): Payer: Self-pay

## 2024-06-24 MED ORDER — LISDEXAMFETAMINE DIMESYLATE 50 MG PO CAPS
50.0000 mg | ORAL_CAPSULE | Freq: Every morning | ORAL | 0 refills | Status: DC
  Filled 2024-06-28: qty 30, 30d supply, fill #0

## 2024-06-24 MED ORDER — LAMOTRIGINE 200 MG PO TABS
200.0000 mg | ORAL_TABLET | Freq: Every day | ORAL | 2 refills | Status: DC
  Filled 2024-06-24: qty 90, 90d supply, fill #0

## 2024-06-29 ENCOUNTER — Other Ambulatory Visit (HOSPITAL_BASED_OUTPATIENT_CLINIC_OR_DEPARTMENT_OTHER): Payer: Self-pay

## 2024-06-29 ENCOUNTER — Other Ambulatory Visit: Payer: Self-pay

## 2024-07-03 ENCOUNTER — Other Ambulatory Visit (HOSPITAL_BASED_OUTPATIENT_CLINIC_OR_DEPARTMENT_OTHER): Payer: Self-pay

## 2024-07-13 DIAGNOSIS — F4312 Post-traumatic stress disorder, chronic: Secondary | ICD-10-CM | POA: Diagnosis not present

## 2024-07-13 DIAGNOSIS — E559 Vitamin D deficiency, unspecified: Secondary | ICD-10-CM | POA: Diagnosis not present

## 2024-07-13 DIAGNOSIS — F411 Generalized anxiety disorder: Secondary | ICD-10-CM | POA: Diagnosis not present

## 2024-07-17 ENCOUNTER — Other Ambulatory Visit (HOSPITAL_BASED_OUTPATIENT_CLINIC_OR_DEPARTMENT_OTHER): Payer: Self-pay

## 2024-07-18 ENCOUNTER — Other Ambulatory Visit (HOSPITAL_BASED_OUTPATIENT_CLINIC_OR_DEPARTMENT_OTHER): Payer: Self-pay

## 2024-07-18 ENCOUNTER — Other Ambulatory Visit: Payer: Self-pay | Admitting: Family Medicine

## 2024-07-20 ENCOUNTER — Other Ambulatory Visit (HOSPITAL_BASED_OUTPATIENT_CLINIC_OR_DEPARTMENT_OTHER): Payer: Self-pay

## 2024-07-20 ENCOUNTER — Other Ambulatory Visit: Payer: Self-pay

## 2024-07-20 MED ORDER — ROSUVASTATIN CALCIUM 10 MG PO TABS
10.0000 mg | ORAL_TABLET | Freq: Every day | ORAL | 2 refills | Status: AC
  Filled 2024-07-20: qty 90, 90d supply, fill #0

## 2024-07-24 ENCOUNTER — Other Ambulatory Visit (HOSPITAL_BASED_OUTPATIENT_CLINIC_OR_DEPARTMENT_OTHER): Payer: Self-pay

## 2024-07-24 ENCOUNTER — Other Ambulatory Visit: Payer: Self-pay

## 2024-07-24 MED ORDER — CLOMIPHENE CITRATE 50 MG PO TABS
50.0000 mg | ORAL_TABLET | ORAL | 6 refills | Status: DC
  Filled 2024-07-24: qty 30, 70d supply, fill #0

## 2024-07-24 MED ORDER — ANASTROZOLE 1 MG PO TABS
1.0000 mg | ORAL_TABLET | ORAL | 3 refills | Status: DC
  Filled 2024-07-24: qty 30, 70d supply, fill #0
  Filled 2024-07-30: qty 30, fill #0

## 2024-07-25 ENCOUNTER — Other Ambulatory Visit (HOSPITAL_BASED_OUTPATIENT_CLINIC_OR_DEPARTMENT_OTHER): Payer: Self-pay

## 2024-07-29 ENCOUNTER — Other Ambulatory Visit (HOSPITAL_BASED_OUTPATIENT_CLINIC_OR_DEPARTMENT_OTHER): Payer: Self-pay

## 2024-07-31 ENCOUNTER — Other Ambulatory Visit: Payer: Self-pay

## 2024-07-31 ENCOUNTER — Other Ambulatory Visit (HOSPITAL_BASED_OUTPATIENT_CLINIC_OR_DEPARTMENT_OTHER): Payer: Self-pay

## 2024-08-01 ENCOUNTER — Other Ambulatory Visit (HOSPITAL_BASED_OUTPATIENT_CLINIC_OR_DEPARTMENT_OTHER): Payer: Self-pay

## 2024-08-03 ENCOUNTER — Other Ambulatory Visit (HOSPITAL_BASED_OUTPATIENT_CLINIC_OR_DEPARTMENT_OTHER): Payer: Self-pay

## 2024-08-03 MED ORDER — VITAMIN D (ERGOCALCIFEROL) 1.25 MG (50000 UNIT) PO CAPS
ORAL_CAPSULE | ORAL | 0 refills | Status: DC
  Filled 2024-08-03: qty 13, 90d supply, fill #0

## 2024-08-04 ENCOUNTER — Other Ambulatory Visit (HOSPITAL_BASED_OUTPATIENT_CLINIC_OR_DEPARTMENT_OTHER): Payer: Self-pay

## 2024-08-05 ENCOUNTER — Other Ambulatory Visit (HOSPITAL_BASED_OUTPATIENT_CLINIC_OR_DEPARTMENT_OTHER): Payer: Self-pay

## 2024-08-06 ENCOUNTER — Other Ambulatory Visit (HOSPITAL_BASED_OUTPATIENT_CLINIC_OR_DEPARTMENT_OTHER): Payer: Self-pay

## 2024-08-07 ENCOUNTER — Other Ambulatory Visit (HOSPITAL_BASED_OUTPATIENT_CLINIC_OR_DEPARTMENT_OTHER): Payer: Self-pay

## 2024-08-08 ENCOUNTER — Other Ambulatory Visit (HOSPITAL_BASED_OUTPATIENT_CLINIC_OR_DEPARTMENT_OTHER): Payer: Self-pay

## 2024-08-09 ENCOUNTER — Other Ambulatory Visit: Payer: Self-pay | Admitting: Family Medicine

## 2024-08-09 DIAGNOSIS — I10 Essential (primary) hypertension: Secondary | ICD-10-CM

## 2024-08-10 ENCOUNTER — Other Ambulatory Visit: Payer: Self-pay

## 2024-08-10 ENCOUNTER — Ambulatory Visit: Admitting: Family Medicine

## 2024-08-10 ENCOUNTER — Other Ambulatory Visit (HOSPITAL_COMMUNITY): Payer: Self-pay

## 2024-08-10 ENCOUNTER — Other Ambulatory Visit (HOSPITAL_BASED_OUTPATIENT_CLINIC_OR_DEPARTMENT_OTHER): Payer: Self-pay

## 2024-08-10 MED ORDER — AMLODIPINE BESYLATE 10 MG PO TABS
10.0000 mg | ORAL_TABLET | Freq: Every morning | ORAL | 1 refills | Status: AC
  Filled 2024-08-10: qty 30, 30d supply, fill #0

## 2024-08-11 ENCOUNTER — Other Ambulatory Visit (HOSPITAL_BASED_OUTPATIENT_CLINIC_OR_DEPARTMENT_OTHER): Payer: Self-pay

## 2024-08-12 ENCOUNTER — Other Ambulatory Visit (HOSPITAL_BASED_OUTPATIENT_CLINIC_OR_DEPARTMENT_OTHER): Payer: Self-pay

## 2024-08-13 ENCOUNTER — Other Ambulatory Visit: Payer: Self-pay

## 2024-08-13 ENCOUNTER — Other Ambulatory Visit (HOSPITAL_BASED_OUTPATIENT_CLINIC_OR_DEPARTMENT_OTHER): Payer: Self-pay

## 2024-08-17 ENCOUNTER — Other Ambulatory Visit (HOSPITAL_BASED_OUTPATIENT_CLINIC_OR_DEPARTMENT_OTHER): Payer: Self-pay

## 2024-08-18 ENCOUNTER — Other Ambulatory Visit (HOSPITAL_BASED_OUTPATIENT_CLINIC_OR_DEPARTMENT_OTHER): Payer: Self-pay

## 2024-08-19 ENCOUNTER — Other Ambulatory Visit (HOSPITAL_BASED_OUTPATIENT_CLINIC_OR_DEPARTMENT_OTHER): Payer: Self-pay

## 2024-08-20 ENCOUNTER — Other Ambulatory Visit: Payer: Self-pay | Admitting: Family Medicine

## 2024-08-20 ENCOUNTER — Other Ambulatory Visit (HOSPITAL_BASED_OUTPATIENT_CLINIC_OR_DEPARTMENT_OTHER): Payer: Self-pay

## 2024-08-20 MED ORDER — VITAMIN D (ERGOCALCIFEROL) 1.25 MG (50000 UNIT) PO CAPS
ORAL_CAPSULE | ORAL | 0 refills | Status: AC
  Filled 2024-08-20: qty 4, 28d supply, fill #0

## 2024-08-24 ENCOUNTER — Other Ambulatory Visit (HOSPITAL_BASED_OUTPATIENT_CLINIC_OR_DEPARTMENT_OTHER): Payer: Self-pay

## 2024-08-26 ENCOUNTER — Other Ambulatory Visit (HOSPITAL_BASED_OUTPATIENT_CLINIC_OR_DEPARTMENT_OTHER): Payer: Self-pay

## 2024-08-26 MED ORDER — LISDEXAMFETAMINE DIMESYLATE 50 MG PO CAPS
50.0000 mg | ORAL_CAPSULE | Freq: Every day | ORAL | 0 refills | Status: DC
  Filled 2024-08-26: qty 30, 30d supply, fill #0

## 2024-08-26 MED ORDER — MOUNJARO 12.5 MG/0.5ML ~~LOC~~ SOAJ
12.5000 mg | SUBCUTANEOUS | 2 refills | Status: AC
  Filled 2024-08-26: qty 2, 28d supply, fill #0

## 2024-08-27 ENCOUNTER — Encounter: Payer: Self-pay | Admitting: Family Medicine

## 2024-08-27 ENCOUNTER — Other Ambulatory Visit: Payer: Self-pay

## 2024-08-27 ENCOUNTER — Ambulatory Visit

## 2024-08-27 ENCOUNTER — Ambulatory Visit: Admitting: Family Medicine

## 2024-08-27 ENCOUNTER — Other Ambulatory Visit (HOSPITAL_BASED_OUTPATIENT_CLINIC_OR_DEPARTMENT_OTHER): Payer: Self-pay

## 2024-08-27 VITALS — BP 112/69 | HR 84 | Ht 72.0 in | Wt 181.0 lb

## 2024-08-27 DIAGNOSIS — Z7985 Long-term (current) use of injectable non-insulin antidiabetic drugs: Secondary | ICD-10-CM | POA: Diagnosis not present

## 2024-08-27 DIAGNOSIS — E119 Type 2 diabetes mellitus without complications: Secondary | ICD-10-CM

## 2024-08-27 DIAGNOSIS — Z125 Encounter for screening for malignant neoplasm of prostate: Secondary | ICD-10-CM

## 2024-08-27 DIAGNOSIS — R42 Dizziness and giddiness: Secondary | ICD-10-CM | POA: Diagnosis not present

## 2024-08-27 DIAGNOSIS — M542 Cervicalgia: Secondary | ICD-10-CM

## 2024-08-27 DIAGNOSIS — R29898 Other symptoms and signs involving the musculoskeletal system: Secondary | ICD-10-CM

## 2024-08-27 DIAGNOSIS — M21372 Foot drop, left foot: Secondary | ICD-10-CM | POA: Diagnosis not present

## 2024-08-27 DIAGNOSIS — M5412 Radiculopathy, cervical region: Secondary | ICD-10-CM

## 2024-08-27 NOTE — Progress Notes (Signed)
 " Tyler Gross - 49 y.o. male MRN [id number]  Date of birth: 1975/11/11  Subjective Chief Complaint  Patient presents with   Toe Injury   Neck Pain   Dizziness   Shoulder Pain   Shin Splints    HPI Tyler Gross is a 49 y.o. male here today with a few complaints today.    He has pressure around his neck and into his ears.  Having some dizziness associated with this.  No increase in headaches.  Denies tinnitus or vision changes.  He also has some pain that radiates into the right shoulder.  This pain is improved but was pretty significant when it did occur.  He has noted some weakness in the left foot with dorsiflexion.  This started out of the blue and does not recall any injury.  Does not have any significant back pain.  Denies associated numbness or tinglin  He has nearly tripped a couple times due to this.  ROS:  A comprehensive ROS was completed and negative except as noted per HPI  Allergies[1]  Past Medical History:  Diagnosis Date   Acne    on back taking doxycycline  for   Alcoholism [facility])    sober since 04/2018   Anxiety    Depression    DM type 2 (diabetes mellitus, type 2) (HCC)    ED (erectile dysfunction)    GAD (generalized anxiety disorder)    GERD (gastroesophageal reflux disease)    Gynecomastia, male    followed by dr coral   High serum estradiol     resolved   History of anal fissures    History of kidney stones    History of small bowel obstruction 2004   Hypertension    IBS (irritable bowel syndrome)    Mixed hyperlipidemia    OSA on CPAP    per last study 07-31-2013  severe osa   Primary hypogonadism in male    endocrinologist-  dr coral    Past Surgical History:  Procedure Laterality Date   CARDIOVASCULAR STRESS TEST  10/05/2009   normal nuclear study w/ no ischemia/  normal LV function and wall motion , ef 71%   COLONOSCOPY  last one   2004, april 2021   EVALUATION UNDER ANESTHESIA WITH ANAL FISTULECTOMY N/A 02/13/2018    Procedure: ANAL EXAM UNDER ANESTHESIA WITH BIOPSY;  Surgeon: Debby Hila, MD;  Location: [facility];  Service: General;  Laterality: N/A;   ORCHIECTOMY Left 1991   w/ placement prosthesis (for torsion)   TESTICULAR EXPLORATION Left 01/29/2020   Procedure: EXCHANGE OF LEFT TESTICULAR PROSTHESIS;  Surgeon: Sherrilee Belvie CROME, MD;  Location: [facility];  Service: Urology;  Laterality: Left;   UPPER GI ENDOSCOPY N/A 06/05/2021   Procedure: UPPER GI ENDOSCOPY;  Surgeon: Gladis Cough, MD;  Location: [facility];  Service: General;  Laterality: N/A;   URETEROLITHOTOMY  1999    Social History   Socioeconomic History   Marital status: Single    Spouse name: Not on file   Number of children: Not on file   Years of education: Not on file   Highest education level: Master's degree (e.g., MA, MS, MEng, MEd, MSW, MBA)  Occupational History   Occupation: RN   Tobacco Use   Smoking status: Former    Current packs/day: 0.00    Average packs/day: 1 pack/day for 10.0 years (10.0 ttl pk-yrs)    Types: Cigarettes    Start date: 02/08/1999    Quit date:  02/07/2009    Years since quitting: 15.5   Smokeless tobacco: Never  Vaping Use   Vaping status: Never Used  Substance and Sexual Activity   Alcohol use: Not Currently   Drug use: No   Sexual activity: Yes    Partners: Male  Other Topics Concern   Not on file  Social History Narrative   Lives with partner   Regular exercise: no   Caffeine use: 1 large cup of coffee daily; 2 to 3 sodas in the evening   Social Drivers of Health   Tobacco Use: Medium Risk (09/01/2024)   Patient History    Smoking Tobacco Use: Former    Smokeless Tobacco Use: Never    Passive Exposure: Not on file  Financial Resource Strain: High Risk (08/24/2024)   Overall Financial Resource Strain (CARDIA)    Difficulty of Paying Living Expenses: Hard  Food Insecurity: No Food Insecurity (08/24/2024)   Epic    Worried About Programme Researcher, Broadcasting/film/video  in the Last Year: Never true    Ran Out of Food in the Last Year: Never true  Transportation Needs: No Transportation Needs (08/24/2024)   Epic    Lack of Transportation (Medical): No    Lack of Transportation (Non-Medical): No  Physical Activity: Inactive (08/24/2024)   Exercise Vital Sign    Days of Exercise per Week: 0 days    Minutes of Exercise per Session: Not on file  Stress: Stress Concern Present (08/24/2024)   [employer] of Occupational Health - Occupational Stress Questionnaire    Feeling of Stress: Rather much  Social Connections: Moderately Isolated (08/24/2024)   Social Connection and Isolation Panel    Frequency of Communication with Friends and Family: Never    Frequency of Social Gatherings with Friends and Family: More than three times a week    Attends Religious Services: Never    Database Administrator or Organizations: No    Attends Engineer, Structural: Not on file    Marital Status: Married  Depression (PHQ2-9): Low Risk (08/28/2024)   Depression (PHQ2-9)    PHQ-2 Score: 0  Alcohol Screen: Low Risk (08/20/2023)   Alcohol Screen    Last Alcohol Screening Score (AUDIT): 3  Housing: Unknown (08/24/2024)   Epic    Unable to Pay for Housing in the Last Year: No    Number of Times Moved in the Last Year: Not on file    Homeless in the Last Year: No  Utilities: Not on file  Health Literacy: Not on file    Family History  Problem Relation Age of Onset   Cancer Mother        breast cancer: stage I   Hypertension Mother    Irritable bowel syndrome Mother    Heart disease Maternal Grandfather    Diabetes Paternal Grandmother    Colon cancer Maternal Aunt        great aunt   Esophageal cancer Neg Hx    Rectal cancer Neg Hx    Stomach cancer Neg Hx     Health Maintenance  Topic Date Due   Bone Density Scan  Never done   Hepatitis C Screening  Never done   Hepatitis B Vaccines 19-59 Average Risk (1 of 3 - 19+ 3-dose series) Never done    OPHTHALMOLOGY EXAM  06/28/2022   COVID-19 Vaccine (4 - 2025-26 season) 09/13/2024 (Originally 04/06/2024)   Pneumococcal Vaccine (2 of 2 - PCV) 08/28/2025 (Originally 02/17/2020)   HEMOGLOBIN A1C  02/24/2025  Diabetic kidney evaluation - eGFR measurement  08/27/2025   Diabetic kidney evaluation - Urine ACR  08/27/2025   FOOT EXAM  08/27/2025   Colonoscopy  10/07/2029   DTaP/Tdap/Td (3 - Td or Tdap) 02/04/2034   Influenza Vaccine  Completed   HPV VACCINES (No Doses Required) Completed   HIV Screening  Completed   Meningococcal B Vaccine  Aged Out     ----------------------------------------------------------------------------------------------------------------------------------------------------------------------------------------------------------------- Physical Exam BP 112/69 (BP Location: Left Arm, Patient Position: Sitting, Cuff Size: Normal)   Pulse 84   Ht 6' (1.829 m)   Wt 181 lb (82.1 kg)   SpO2 100%   BMI 24.55 kg/m   Physical Exam Constitutional:      Appearance: Normal appearance.  HENT:     Head: Normocephalic and atraumatic.  Eyes:     General: No scleral icterus. Cardiovascular:     Rate and Rhythm: Normal rate and regular rhythm.  Musculoskeletal:     Cervical back: Normal range of motion and neck supple.     Comments: Right upper extremity strength is normal. Bilateral lower extremity strength is normal in the legs.  Weekend with dorsiflexion of the left foot.  Normal DTRs bilateral lower extremities.  Neurological:     Mental Status: He is alert.     ------------------------------------------------------------------------------------------------------------------------------------------------------------------------------------------------------------------- Assessment and Plan  Cervical radiculopathy He has had some increased radicular symptoms as well as symptoms radiating into the cranial base associated with some dizziness.  Question possible  cervicogenic dizziness.  X-rays of cervical spine ordered.  Type 2 diabetes mellitus without complication, without long-term current use of insulin  (HCC) Diabetes is well controlled.  Continue with Mounjaro , currently at 12.5mg /week. Encouraged to incorporate regular exercise.   Dizziness We discussed MRI of the brain for further evaluation of continued dizziness.  Left foot drop Unexplained weakness of the left foot.  Checking lab evaluation today for potential causes.  May need nerve conduction.   No orders of the defined types were placed in this encounter.   No follow-ups on file.        [1]  Allergies Allergen Reactions   Atorvastatin Other (See Comments)    Muscle fatigue   Bee Venom Swelling   Sulfamethoxazole-Trimethoprim Hives    Bactrim    "

## 2024-08-28 ENCOUNTER — Ambulatory Visit: Payer: Self-pay | Admitting: Family Medicine

## 2024-08-28 ENCOUNTER — Other Ambulatory Visit (HOSPITAL_BASED_OUTPATIENT_CLINIC_OR_DEPARTMENT_OTHER): Payer: Self-pay

## 2024-08-28 ENCOUNTER — Other Ambulatory Visit (HOSPITAL_COMMUNITY): Payer: Self-pay

## 2024-08-28 ENCOUNTER — Encounter (HOSPITAL_BASED_OUTPATIENT_CLINIC_OR_DEPARTMENT_OTHER): Payer: Self-pay

## 2024-08-28 LAB — CBC WITH DIFFERENTIAL/PLATELET
Basophils Absolute: 0.1 x10E3/uL (ref 0.0–0.2)
Basos: 1 %
EOS (ABSOLUTE): 0.2 x10E3/uL (ref 0.0–0.4)
Eos: 2 %
Hematocrit: 46 % (ref 37.5–51.0)
Hemoglobin: 15.3 g/dL (ref 13.0–17.7)
Immature Grans (Abs): 0 x10E3/uL (ref 0.0–0.1)
Immature Granulocytes: 0 %
Lymphocytes Absolute: 2.2 x10E3/uL (ref 0.7–3.1)
Lymphs: 27 %
MCH: 31.2 pg (ref 26.6–33.0)
MCHC: 33.3 g/dL (ref 31.5–35.7)
MCV: 94 fL (ref 79–97)
Monocytes Absolute: 0.6 x10E3/uL (ref 0.1–0.9)
Monocytes: 8 %
Neutrophils Absolute: 5.1 x10E3/uL (ref 1.4–7.0)
Neutrophils: 61 %
Platelets: 293 x10E3/uL (ref 150–450)
RBC: 4.91 x10E6/uL (ref 4.14–5.80)
RDW: 12 % (ref 11.6–15.4)
WBC: 8.2 x10E3/uL (ref 3.4–10.8)

## 2024-08-28 LAB — SYPHILIS: RPR W/REFLEX TO RPR TITER AND TREPONEMAL ANTIBODIES, TRADITIONAL SCREENING AND DIAGNOSIS ALGORITHM: RPR Ser Ql: NONREACTIVE

## 2024-08-28 LAB — CMP14+EGFR
ALT: 21 IU/L (ref 0–44)
AST: 20 IU/L (ref 0–40)
Albumin: 5 g/dL (ref 4.1–5.1)
Alkaline Phosphatase: 66 IU/L (ref 47–123)
BUN/Creatinine Ratio: 14 (ref 9–20)
BUN: 14 mg/dL (ref 6–24)
Bilirubin Total: 0.5 mg/dL (ref 0.0–1.2)
CO2: 23 mmol/L (ref 20–29)
Calcium: 9.5 mg/dL (ref 8.7–10.2)
Chloride: 103 mmol/L (ref 96–106)
Creatinine, Ser: 1.01 mg/dL (ref 0.76–1.27)
Globulin, Total: 2 g/dL (ref 1.5–4.5)
Glucose: 92 mg/dL (ref 70–99)
Potassium: 4.5 mmol/L (ref 3.5–5.2)
Sodium: 142 mmol/L (ref 134–144)
Total Protein: 7 g/dL (ref 6.0–8.5)
eGFR: 91 mL/min/1.73

## 2024-08-28 LAB — TSH+FREE T4
Free T4: 1.34 ng/dL (ref 0.82–1.77)
TSH: 1.93 u[IU]/mL (ref 0.450–4.500)

## 2024-08-28 LAB — HEMOGLOBIN A1C
Est. average glucose Bld gHb Est-mCnc: 91 mg/dL
Hgb A1c MFr Bld: 4.8 % (ref 4.8–5.6)

## 2024-08-28 LAB — HIV ANTIBODY (ROUTINE TESTING W REFLEX): HIV Screen 4th Generation wRfx: NONREACTIVE

## 2024-08-28 LAB — MICROALBUMIN / CREATININE URINE RATIO
Creatinine, Urine: 152.9 mg/dL
Microalb/Creat Ratio: 9 mg/g{creat} (ref 0–29)
Microalbumin, Urine: 13.5 ug/mL

## 2024-08-28 LAB — LIPID PANEL WITH LDL/HDL RATIO
Cholesterol, Total: 149 mg/dL (ref 100–199)
HDL: 69 mg/dL
LDL Chol Calc (NIH): 57 mg/dL (ref 0–99)
LDL/HDL Ratio: 0.8 ratio (ref 0.0–3.6)
Triglycerides: 137 mg/dL (ref 0–149)
VLDL Cholesterol Cal: 23 mg/dL (ref 5–40)

## 2024-08-28 LAB — PSA: Prostate Specific Ag, Serum: 2.3 ng/mL (ref 0.0–4.0)

## 2024-08-28 LAB — SEDIMENTATION RATE: Sed Rate: 2 mm/h (ref 0–15)

## 2024-08-28 LAB — VITAMIN B12: Vitamin B-12: 456 pg/mL (ref 232–1245)

## 2024-08-28 MED ORDER — LISDEXAMFETAMINE DIMESYLATE 50 MG PO CAPS
50.0000 mg | ORAL_CAPSULE | Freq: Every morning | ORAL | 0 refills | Status: AC
  Filled 2024-08-28: qty 30, 30d supply, fill #0

## 2024-08-31 ENCOUNTER — Ambulatory Visit: Admitting: Family Medicine

## 2024-09-01 ENCOUNTER — Encounter: Payer: Self-pay | Admitting: Family Medicine

## 2024-09-01 DIAGNOSIS — R42 Dizziness and giddiness: Secondary | ICD-10-CM | POA: Insufficient documentation

## 2024-09-01 DIAGNOSIS — M21372 Foot drop, left foot: Secondary | ICD-10-CM | POA: Insufficient documentation

## 2024-09-01 NOTE — Assessment & Plan Note (Signed)
 Unexplained weakness of the left foot.  Checking lab evaluation today for potential causes.  May need nerve conduction.

## 2024-09-01 NOTE — Assessment & Plan Note (Signed)
 We discussed MRI of the brain for further evaluation of continued dizziness.

## 2024-09-01 NOTE — Assessment & Plan Note (Addendum)
 He has had some increased radicular symptoms as well as symptoms radiating into the cranial base associated with some dizziness.  Question possible cervicogenic dizziness.  X-rays of cervical spine ordered.

## 2024-09-01 NOTE — Assessment & Plan Note (Signed)
 Diabetes is well controlled.  Continue with Mounjaro , currently at 12.5mg /week. Encouraged to incorporate regular exercise.

## 2024-09-02 ENCOUNTER — Other Ambulatory Visit (HOSPITAL_BASED_OUTPATIENT_CLINIC_OR_DEPARTMENT_OTHER): Payer: Self-pay

## 2024-09-05 ENCOUNTER — Encounter: Payer: Self-pay | Admitting: Family Medicine

## 2024-09-07 ENCOUNTER — Other Ambulatory Visit: Payer: Self-pay | Admitting: Family Medicine

## 2024-09-07 DIAGNOSIS — M542 Cervicalgia: Secondary | ICD-10-CM

## 2024-09-07 DIAGNOSIS — R42 Dizziness and giddiness: Secondary | ICD-10-CM

## 2024-09-10 ENCOUNTER — Other Ambulatory Visit: Payer: Self-pay | Admitting: Family Medicine

## 2024-09-10 DIAGNOSIS — R42 Dizziness and giddiness: Secondary | ICD-10-CM

## 2024-09-14 ENCOUNTER — Ambulatory Visit: Admitting: Family Medicine

## 2024-09-20 ENCOUNTER — Other Ambulatory Visit: Payer: PRIVATE HEALTH INSURANCE
